# Patient Record
Sex: Male | Born: 1940
Health system: Southern US, Community
[De-identification: ages and names within clinical notes are randomized; demographics above are authoritative.]

## PROBLEM LIST (undated history)

## (undated) DIAGNOSIS — Z87891 Personal history of nicotine dependence: Secondary | ICD-10-CM

## (undated) DIAGNOSIS — I714 Abdominal aortic aneurysm, without rupture: Secondary | ICD-10-CM

## (undated) DIAGNOSIS — K219 Gastro-esophageal reflux disease without esophagitis: Secondary | ICD-10-CM

## (undated) DIAGNOSIS — Z8669 Personal history of other diseases of the nervous system and sense organs: Secondary | ICD-10-CM

## (undated) DIAGNOSIS — E7800 Pure hypercholesterolemia, unspecified: Secondary | ICD-10-CM

## (undated) DIAGNOSIS — E785 Hyperlipidemia, unspecified: Secondary | ICD-10-CM

## (undated) DIAGNOSIS — R32 Unspecified urinary incontinence: Secondary | ICD-10-CM

## (undated) DIAGNOSIS — C801 Malignant (primary) neoplasm, unspecified: Secondary | ICD-10-CM

## (undated) DIAGNOSIS — R51 Headache: Secondary | ICD-10-CM

## (undated) DIAGNOSIS — M1990 Unspecified osteoarthritis, unspecified site: Secondary | ICD-10-CM

## (undated) DIAGNOSIS — I6523 Occlusion and stenosis of bilateral carotid arteries: Secondary | ICD-10-CM

## (undated) DIAGNOSIS — D573 Sickle-cell trait: Secondary | ICD-10-CM

## (undated) DIAGNOSIS — C61 Malignant neoplasm of prostate: Secondary | ICD-10-CM

## (undated) DIAGNOSIS — I10 Essential (primary) hypertension: Secondary | ICD-10-CM

## (undated) HISTORY — DX: Personal history of nicotine dependence: Z87.891

## (undated) HISTORY — DX: Essential (primary) hypertension: I10

## (undated) HISTORY — PX: CATARACT EXTRACTION, BILATERAL: SHX1313

## (undated) HISTORY — PX: COLONOSCOPY: SHX174

## (undated) HISTORY — DX: Pure hypercholesterolemia, unspecified: E78.00

## (undated) HISTORY — DX: Abdominal aortic aneurysm, without rupture: I71.4

## (undated) HISTORY — DX: Hyperlipidemia, unspecified: E78.5

## (undated) HISTORY — PX: FRACTURE SURGERY: SHX138

## (undated) HISTORY — PX: CARDIAC CATHETERIZATION: SHX172

---
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
## 1999-11-25 HISTORY — PX: PROSTATECTOMY: SHX69

## 2003-12-11 ENCOUNTER — Ambulatory Visit (HOSPITAL_COMMUNITY): Admission: RE | Admit: 2003-12-11 | Discharge: 2003-12-11 | Payer: Self-pay | Admitting: Gastroenterology

## 2006-03-12 ENCOUNTER — Encounter: Admission: RE | Admit: 2006-03-12 | Discharge: 2006-03-12 | Payer: Self-pay | Admitting: Internal Medicine

## 2007-11-25 HISTORY — PX: OTHER SURGICAL HISTORY: SHX169

## 2007-11-25 HISTORY — PX: BLADDER TUMOR EXCISION: SHX238

## 2008-02-03 ENCOUNTER — Encounter: Admission: RE | Admit: 2008-02-03 | Discharge: 2008-02-03 | Payer: Self-pay | Admitting: Internal Medicine

## 2008-11-03 ENCOUNTER — Encounter: Admission: RE | Admit: 2008-11-03 | Discharge: 2008-11-03 | Payer: Self-pay | Admitting: Internal Medicine

## 2010-05-01 HISTORY — PX: ARTHROSCOPY KNEE W/ DRILLING: SUR92

## 2011-01-31 ENCOUNTER — Ambulatory Visit (HOSPITAL_COMMUNITY)
Admission: RE | Admit: 2011-01-31 | Discharge: 2011-01-31 | Disposition: A | Discharge status: Home / Self Care | Payer: Worker's Compensation | Source: Ambulatory Visit | Attending: Orthopedic Surgery | Admitting: Orthopedic Surgery

## 2011-01-31 ENCOUNTER — Other Ambulatory Visit (HOSPITAL_COMMUNITY): Payer: Self-pay | Admitting: Orthopedic Surgery

## 2011-01-31 ENCOUNTER — Encounter (HOSPITAL_COMMUNITY): Payer: Worker's Compensation

## 2011-01-31 DIAGNOSIS — M1990 Unspecified osteoarthritis, unspecified site: Secondary | ICD-10-CM

## 2011-01-31 DIAGNOSIS — Z01810 Encounter for preprocedural cardiovascular examination: Secondary | ICD-10-CM | POA: Insufficient documentation

## 2011-01-31 DIAGNOSIS — Z01818 Encounter for other preprocedural examination: Secondary | ICD-10-CM | POA: Insufficient documentation

## 2011-01-31 DIAGNOSIS — M1710 Unilateral primary osteoarthritis, unspecified knee: Secondary | ICD-10-CM | POA: Insufficient documentation

## 2011-01-31 DIAGNOSIS — M47814 Spondylosis without myelopathy or radiculopathy, thoracic region: Secondary | ICD-10-CM | POA: Insufficient documentation

## 2011-01-31 LAB — COMPREHENSIVE METABOLIC PANEL
Albumin: 4.2 g/dL (ref 3.5–5.2)
CO2: 27 mEq/L (ref 19–32)
Calcium: 9.5 mg/dL (ref 8.4–10.5)
Creatinine, Ser: 0.97 mg/dL (ref 0.4–1.5)
GFR calc Af Amer: >60 mL/min (ref >60)
GFR calc non Af Amer: >60 mL/min (ref >60)
Sodium: 138 mEq/L (ref 135–145)

## 2011-01-31 LAB — DIFFERENTIAL
Basophils Absolute: 0 10*3/uL (ref 0.0–0.1)
Eosinophils Relative: 3 % (ref 0–5)
Neutro Abs: 1.8 10*3/uL (ref 1.7–7.7)
Neutrophils Relative %: 43 % (ref 43–77)

## 2011-01-31 LAB — CBC
HCT: 39 % (ref 39.0–52.0)
Hemoglobin: 13.2 g/dL (ref 13.0–17.0)
MCH: 29.5 pg (ref 26.0–34.0)
MCV: 87.2 fL (ref 78.0–100.0)
RDW: 13 % (ref 11.5–15.5)

## 2011-01-31 LAB — URINALYSIS, ROUTINE W REFLEX MICROSCOPIC
Glucose, UA: NEGATIVE mg/dL
Nitrite: NEGATIVE
Protein, ur: NEGATIVE mg/dL
Specific Gravity, Urine: 1.017 (ref 1.005–1.030)
Urobilinogen, UA: 0.2 mg/dL (ref 0.0–1.0)

## 2011-01-31 LAB — SURGICAL PCR SCREEN
MRSA, PCR: NEGATIVE
Staphylococcus aureus: NEGATIVE

## 2011-01-31 LAB — PROTIME-INR: Prothrombin Time: 13 seconds (ref 11.6–15.2)

## 2011-02-06 ENCOUNTER — Inpatient Hospital Stay (HOSPITAL_COMMUNITY)
Admission: RE | Admit: 2011-02-06 | Discharge: 2011-02-08 | DRG: 470 | Disposition: A | Discharge status: Home Health | Payer: Worker's Compensation | Source: Ambulatory Visit | Attending: Orthopedic Surgery | Admitting: Orthopedic Surgery

## 2011-02-06 DIAGNOSIS — M1710 Unilateral primary osteoarthritis, unspecified knee: Principal | ICD-10-CM | POA: Diagnosis present

## 2011-02-06 DIAGNOSIS — Z8546 Personal history of malignant neoplasm of prostate: Secondary | ICD-10-CM

## 2011-02-06 DIAGNOSIS — K219 Gastro-esophageal reflux disease without esophagitis: Secondary | ICD-10-CM | POA: Diagnosis present

## 2011-02-06 DIAGNOSIS — D573 Sickle-cell trait: Secondary | ICD-10-CM | POA: Diagnosis present

## 2011-02-06 DIAGNOSIS — Z87891 Personal history of nicotine dependence: Secondary | ICD-10-CM

## 2011-02-06 DIAGNOSIS — D62 Acute posthemorrhagic anemia: Secondary | ICD-10-CM | POA: Diagnosis not present

## 2011-02-06 DIAGNOSIS — I10 Essential (primary) hypertension: Secondary | ICD-10-CM | POA: Diagnosis present

## 2011-02-06 HISTORY — PX: JOINT REPLACEMENT: SHX530

## 2011-02-06 LAB — CROSSMATCH: Antibody Screen: NEGATIVE

## 2011-02-06 LAB — ABO/RH: ABO/RH(D): O POS

## 2011-02-07 LAB — BASIC METABOLIC PANEL
Chloride: 106 mEq/L (ref 96–112)
GFR calc Af Amer: >60 mL/min (ref >60)
GFR calc non Af Amer: >60 mL/min (ref >60)

## 2011-02-07 LAB — CBC
Hemoglobin: 10.5 g/dL — ABNORMAL LOW (ref 13.0–17.0)
MCH: 29.1 pg (ref 26.0–34.0)
MCHC: 32.9 g/dL (ref 30.0–36.0)
MCV: 88.4 fL (ref 78.0–100.0)
RBC: 3.61 MIL/uL — ABNORMAL LOW (ref 4.22–5.81)
WBC: 5.8 10*3/uL (ref 4.0–10.5)

## 2011-02-08 LAB — BASIC METABOLIC PANEL
CO2: 29 mEq/L (ref 19–32)
Calcium: 8.4 mg/dL (ref 8.4–10.5)
Creatinine, Ser: 0.99 mg/dL (ref 0.4–1.5)
GFR calc Af Amer: >60 mL/min (ref >60)
Potassium: 3.5 mEq/L (ref 3.5–5.1)

## 2011-02-08 LAB — CBC
HCT: 28.4 % — ABNORMAL LOW (ref 39.0–52.0)
MCHC: 33.8 g/dL (ref 30.0–36.0)
Platelets: 141 10*3/uL — ABNORMAL LOW (ref 150–400)
WBC: 6 10*3/uL (ref 4.0–10.5)

## 2011-02-25 NOTE — Op Note (Signed)
NAME:  Kevin Pacheco, Kevin Pacheco          ACCOUNT NO.:  [id number]  MEDICAL RECORD NO.:  [id number]           PATIENT TYPE:  I  LOCATION:  0002                         FACILITY:  [facility]  PHYSICIAN:  Kinzley Savell L. Rendall, M.D.  DATE OF BIRTH:  17-Apr-1941  DATE OF PROCEDURE: DATE OF DISCHARGE:                              OPERATIVE REPORT   PREOPERATIVE DIAGNOSIS:  Osteoarthritis, left knee.  SURGICAL PROCEDURES:  Left LCS total knee arthroplasty with computer navigation assistance.  POSTOPERATIVE DIAGNOSIS:  Osteoarthritis, left knee.  SURGEON:  Bobby Ragan L. Rendall, M.D.  ASSISTANT:  Legrand Pitts. Duffy, P.A.  ANESTHESIA:  General with femoral nerve block.  PATHOLOGY:  The patient has 5 degrees fixed varus, 4 degrees fixed flexion contracture and complete bone-against-bone in the medial compartment.  He has had unremitting pain in the medial side of his knee and is looking forward to the procedure.  PROCEDURE:  Under general anesthesia with femoral nerve block, the left leg was prepared with DuraPrep and draped as a sterile field.  Sterile tourniquet is placed proximally.  The leg was wrapped out with the Esmarch and the tourniquet was elevated at 350 mmHg.  Time-out was done. Preoperative antibiotics were received and everything was good.  Midline incision is made.  The patella was everted.  The knee is debrided in preparation for computer navigation.  Schanz pins were then placed in the superior medial tibia and distal femur.  The arrays were set up. The ankle and hip were mapped.  The proximal tibia and distal femur are mapped.  The guide is then used for proximal tibial resection.  This was done within 1 degree of anatomic alignment.  The balancer was then used and even after proximal tibial resection, there was 4 degrees fixed varus.  This required further release at the medial joint line and the computer helped guide this.  Once release was appropriately performed, the first femoral guide  was used and anterior and posterior flare of the distal femur were resected within 1 degree of anatomic rotation.  Distal femoral cut was then made for a large.  The gap balancing appeared well balanced at 10.  The lamina spreader was inserted.  Remnants of the menisci and cruciates were removed.  Spurs were taken down on the back of femoral condyles.  Once this was completed, the tibia was sized at #5.  Trial was placed on the tibia.  Center peg hole with keels were applied.  Then a trial seating of a #5 tibia, 10 bearing and large femur revealed anatomic alignment within half a degree of anatomic alignment. Full extension to 0 and the knee was stable to varus, valgus and drawer testing.  The patella was then osteotomized.  There was a very thick patella was taken down to about 10 mm and 3 peg holes placed.  Patellar component inserted and with all components in place, there was excellent fit, alignment, flexion and stability.  At this point, permanent components were obtained.  The knee was copiously irrigated with saline, doing pulse irrigation.  All components were then cemented in place and while waiting for cement to harden, anterior synovectomy was carried out.  Tourniquet was let down at 66 minutes with the cement hard. Multiple small vessels were cauterized.  A medium Hemovac drain was inserted.  The knee was then closed in layers with #1 Tycron, 0 Vicryl, 2-0 Vicryl and skin clips.  The patient tolerated the procedure well and returned to recovery in good condition.     Elison Worrel L. Priscille Kluver, M.D.     Renato Gails  D:  02/06/2011  T:  02/06/2011  Job:  [id number]  Electronically Signed by Erasmo Leventhal M.D. on 02/25/2011 01:43:21 PM

## 2011-04-11 NOTE — Op Note (Signed)
NAME:  Kevin Pacheco, Kevin Pacheco                      ACCOUNT NO.:  [id number]   MEDICAL RECORD NO.:  [id number]                   PATIENT TYPE:  AMB   LOCATION:  ENDO                                 FACILITY:  [facility]   PHYSICIAN:  Anselmo Rod, M.D.               DATE OF BIRTH:  07-15-1941   DATE OF PROCEDURE:  12/11/2003  DATE OF DISCHARGE:                                 OPERATIVE REPORT   PROCEDURE PERFORMED:  Colonoscopy.   ENDOSCOPIST:  Charna Elizabeth, M.D.   INSTRUMENT USED:  Olympus video colonoscope.   INDICATIONS FOR PROCEDURE:  The patient is a 70 year old African-American  male with a personal history of prostate cancer undergoing colonoscopy for  rectal bleeding.  Rule out colonic polyps, masses, etc.  The patient has a  history of colonic polyps in the past.   PREPROCEDURE PREPARATION:  Informed consent was procured from the patient.  The patient was fasted for eight hours prior to the procedure and prepped  with a bottle of magnesium citrate and a gallon of GoLYTELY the night prior  to the procedure.   PREPROCEDURE PHYSICAL:  The patient had stable vital signs.  Neck supple.  Chest clear to auscultation.  S1 and S2 regular.  Abdomen soft with normal  bowel sounds.   DESCRIPTION OF PROCEDURE:  The patient was placed in left lateral decubitus  position and sedated with 70 mg of Demerol and 7 mg of Versed in slow  incremental doses.  Once the patient was adequately sedated and maintained  on low flow oxygen and continuous cardiac monitoring, the Olympus video  colonoscope was advanced from the rectum to the cecum.  The appendicular  orifice and ileocecal valve were clearly visualized and photographed.  There  was some residual stool in the colon and multiple washes were done.  No  masses or polyps were identified.  There was no evidence of diverticulosis.  Small internal hemorrhoids were recognized on retroflexion in the rectum.  The patient tolerated the procedure well  without complications.   IMPRESSION:  1. Normal colonoscopy up to the cecum.  No masses, polyps or diverticulosis     were seen.  2. Small nonbleeding internal hemorrhoids.   RECOMMENDATIONS:  1. Repeat colorectal cancer screening is recommended in the next 10 years     unless the patient develops any abnormal symptoms in the interim.  2. Continue high fiber diet with liberal fluid intake.  3. Trial of Anusol suppositories if the patient has ongoing rectal bleeding.  4. Outpatient followup as need arises in the future.                                               Anselmo Rod, M.D.    JNM/MEDQ  D:  12/11/2003  T:  12/11/2003  Job:  [id number]   cc:   Gabriel Earing, M.D.  [address]  [place]  [place] [address]  Fax: [phone]

## 2013-01-04 ENCOUNTER — Other Ambulatory Visit (HOSPITAL_COMMUNITY): Payer: Self-pay | Admitting: Orthopedic Surgery

## 2013-01-04 DIAGNOSIS — M25562 Pain in left knee: Secondary | ICD-10-CM

## 2013-01-10 ENCOUNTER — Encounter (HOSPITAL_COMMUNITY)
Admission: RE | Admit: 2013-01-10 | Discharge: 2013-01-10 | Disposition: A | Discharge status: Home / Self Care | Payer: Worker's Compensation | Source: Ambulatory Visit | Attending: Orthopedic Surgery | Admitting: Orthopedic Surgery

## 2013-01-10 DIAGNOSIS — Z96659 Presence of unspecified artificial knee joint: Secondary | ICD-10-CM | POA: Insufficient documentation

## 2013-01-10 DIAGNOSIS — M25562 Pain in left knee: Secondary | ICD-10-CM

## 2013-01-10 DIAGNOSIS — M25569 Pain in unspecified knee: Secondary | ICD-10-CM | POA: Insufficient documentation

## 2013-01-10 DIAGNOSIS — R937 Abnormal findings on diagnostic imaging of other parts of musculoskeletal system: Secondary | ICD-10-CM | POA: Insufficient documentation

## 2013-01-10 MED ORDER — TECHNETIUM TC 99M MEDRONATE IV KIT
27.0000 | PACK | Freq: Once | INTRAVENOUS | Status: AC | PRN
  Administered 2013-01-10: 27 via INTRAVENOUS

## 2013-02-28 ENCOUNTER — Encounter (HOSPITAL_COMMUNITY): Payer: Self-pay | Admitting: Pharmacy Technician

## 2013-03-04 ENCOUNTER — Other Ambulatory Visit: Payer: Self-pay | Admitting: Orthopedic Surgery

## 2013-03-07 ENCOUNTER — Encounter (HOSPITAL_COMMUNITY)
Admission: RE | Admit: 2013-03-07 | Discharge: 2013-03-07 | Disposition: A | Discharge status: Home / Self Care | Payer: Worker's Compensation | Source: Ambulatory Visit | Attending: Orthopedic Surgery | Admitting: Orthopedic Surgery

## 2013-03-07 ENCOUNTER — Encounter (HOSPITAL_COMMUNITY): Payer: Self-pay

## 2013-03-07 ENCOUNTER — Ambulatory Visit (HOSPITAL_COMMUNITY)
Admission: RE | Admit: 2013-03-07 | Discharge: 2013-03-07 | Disposition: A | Discharge status: Home / Self Care | Payer: Worker's Compensation | Source: Ambulatory Visit | Attending: Orthopedic Surgery | Admitting: Orthopedic Surgery

## 2013-03-07 DIAGNOSIS — I10 Essential (primary) hypertension: Secondary | ICD-10-CM | POA: Insufficient documentation

## 2013-03-07 DIAGNOSIS — Z01818 Encounter for other preprocedural examination: Secondary | ICD-10-CM | POA: Insufficient documentation

## 2013-03-07 DIAGNOSIS — F17200 Nicotine dependence, unspecified, uncomplicated: Secondary | ICD-10-CM | POA: Insufficient documentation

## 2013-03-07 DIAGNOSIS — M25569 Pain in unspecified knee: Secondary | ICD-10-CM | POA: Insufficient documentation

## 2013-03-07 DIAGNOSIS — Z01812 Encounter for preprocedural laboratory examination: Secondary | ICD-10-CM | POA: Insufficient documentation

## 2013-03-07 HISTORY — DX: Headache: R51

## 2013-03-07 HISTORY — DX: Malignant (primary) neoplasm, unspecified: C80.1

## 2013-03-07 HISTORY — DX: Essential (primary) hypertension: I10

## 2013-03-07 HISTORY — DX: Unspecified osteoarthritis, unspecified site: M19.90

## 2013-03-07 HISTORY — DX: Gastro-esophageal reflux disease without esophagitis: K21.9

## 2013-03-07 LAB — COMPREHENSIVE METABOLIC PANEL
ALT: 19 U/L (ref 0–53)
CO2: 28 mEq/L (ref 19–32)
Calcium: 9.4 mg/dL (ref 8.4–10.5)
GFR calc Af Amer: >90 mL/min (ref >90)
GFR calc non Af Amer: 84 mL/min — ABNORMAL LOW (ref >90)
Glucose, Bld: 103 mg/dL — ABNORMAL HIGH (ref 70–99)
Sodium: 141 mEq/L (ref 135–145)

## 2013-03-07 LAB — URINALYSIS, ROUTINE W REFLEX MICROSCOPIC
Glucose, UA: NEGATIVE mg/dL
Ketones, ur: NEGATIVE mg/dL
Protein, ur: NEGATIVE mg/dL

## 2013-03-07 LAB — CBC WITH DIFFERENTIAL/PLATELET
Eosinophils Relative: 2 % (ref 0–5)
HCT: 39.2 % (ref 39.0–52.0)
Hemoglobin: 13.9 g/dL (ref 13.0–17.0)
Lymphocytes Relative: 46 % (ref 12–46)
Lymphs Abs: 2 10*3/uL (ref 0.7–4.0)
MCV: 85.8 fL (ref 78.0–100.0)
Monocytes Absolute: 0.3 10*3/uL (ref 0.1–1.0)
Monocytes Relative: 7 % (ref 3–12)
Platelets: 187 10*3/uL (ref 150–400)
RBC: 4.57 MIL/uL (ref 4.22–5.81)
WBC: 4.3 10*3/uL (ref 4.0–10.5)

## 2013-03-07 LAB — TYPE AND SCREEN
ABO/RH(D): O POS
Antibody Screen: NEGATIVE

## 2013-03-07 LAB — SURGICAL PCR SCREEN: MRSA, PCR: NEGATIVE

## 2013-03-07 NOTE — Pre-Procedure Instructions (Signed)
Kevin Pacheco  03/07/2013   Your procedure is scheduled on:  Monday March 14, 2013  Report to [facility] at [phone] AM.  Call this number if you have problems the morning of surgery: [phone]   Remember:   Do not eat food or drink liquids after midnight.Sunday   Take these medicines the morning of surgery with A SIP OF WATER: Omeprazole (Prilosec)   Do not wear jewelry.  Do not wear lotions,  orpowder. You may wear deodorant.             Men may shave face and neck.  Do not bring valuables to the hospital.  Contacts, dentures or bridgework may not be worn into surgery.  Leave suitcase in the car. After surgery it may be brought to your room.  For patients admitted to the hospital, checkout time is 11:00 AM the day of  discharge.   Patients discharged the day of surgery will not be allowed to drive  home.    Special Instructions: Incentive Spirometry - Practice and bring it with you on the day of surgery. Shower using CHG 2 nights before surgery and the night before surgery.  If you shower the day of surgery use CHG.  Use special wash - you have one bottle of CHG for all showers.  You should use approximately 1/3 of the bottle for each shower.   Please read over the following fact sheets that you were given: Pain Booklet, Coughing and Deep Breathing, Blood Transfusion Information, MRSA Information and Surgical Site Infection Prevention

## 2013-03-08 LAB — URINE CULTURE
Colony Count: NO GROWTH
Culture: NO GROWTH

## 2013-03-14 ENCOUNTER — Ambulatory Visit (HOSPITAL_COMMUNITY): Payer: Worker's Compensation | Admitting: Anesthesiology

## 2013-03-14 ENCOUNTER — Inpatient Hospital Stay (HOSPITAL_COMMUNITY)
Admission: RE | Admit: 2013-03-14 | Discharge: 2013-03-16 | DRG: 467 | Disposition: A | Discharge status: Home Health | Payer: Worker's Compensation | Source: Ambulatory Visit | Attending: Orthopedic Surgery | Admitting: Orthopedic Surgery

## 2013-03-14 ENCOUNTER — Encounter (HOSPITAL_COMMUNITY): Payer: Self-pay | Admitting: *Deleted

## 2013-03-14 ENCOUNTER — Encounter (HOSPITAL_COMMUNITY): Payer: Self-pay | Admitting: Anesthesiology

## 2013-03-14 ENCOUNTER — Encounter (HOSPITAL_COMMUNITY)
Admission: RE | Disposition: A | Discharge status: Home Health | Payer: Self-pay | Source: Ambulatory Visit | Attending: Orthopedic Surgery

## 2013-03-14 DIAGNOSIS — M129 Arthropathy, unspecified: Secondary | ICD-10-CM | POA: Diagnosis present

## 2013-03-14 DIAGNOSIS — K219 Gastro-esophageal reflux disease without esophagitis: Secondary | ICD-10-CM | POA: Diagnosis present

## 2013-03-14 DIAGNOSIS — T8489XA Other specified complication of internal orthopedic prosthetic devices, implants and grafts, initial encounter: Principal | ICD-10-CM | POA: Diagnosis present

## 2013-03-14 DIAGNOSIS — I10 Essential (primary) hypertension: Secondary | ICD-10-CM | POA: Diagnosis present

## 2013-03-14 DIAGNOSIS — Z9079 Acquired absence of other genital organ(s): Secondary | ICD-10-CM

## 2013-03-14 DIAGNOSIS — Z87891 Personal history of nicotine dependence: Secondary | ICD-10-CM

## 2013-03-14 DIAGNOSIS — M25569 Pain in unspecified knee: Secondary | ICD-10-CM | POA: Diagnosis present

## 2013-03-14 DIAGNOSIS — D62 Acute posthemorrhagic anemia: Secondary | ICD-10-CM | POA: Diagnosis not present

## 2013-03-14 DIAGNOSIS — Y831 Surgical operation with implant of artificial internal device as the cause of abnormal reaction of the patient, or of later complication, without mention of misadventure at the time of the procedure: Secondary | ICD-10-CM | POA: Diagnosis present

## 2013-03-14 DIAGNOSIS — Z96652 Presence of left artificial knee joint: Secondary | ICD-10-CM

## 2013-03-14 DIAGNOSIS — R51 Headache: Secondary | ICD-10-CM | POA: Diagnosis present

## 2013-03-14 DIAGNOSIS — Z8546 Personal history of malignant neoplasm of prostate: Secondary | ICD-10-CM

## 2013-03-14 HISTORY — PX: I & D KNEE WITH POLY EXCHANGE: SHX5024

## 2013-03-14 LAB — CREATININE, SERUM
Creatinine, Ser: 0.96 mg/dL (ref 0.50–1.35)
GFR calc Af Amer: >90 mL/min (ref >90)
GFR calc non Af Amer: 80 mL/min — ABNORMAL LOW (ref >90)

## 2013-03-14 LAB — GRAM STAIN

## 2013-03-14 LAB — CBC
HCT: 36.4 % — ABNORMAL LOW (ref 39.0–52.0)
Hemoglobin: 12.8 g/dL — ABNORMAL LOW (ref 13.0–17.0)
WBC: 9 10*3/uL (ref 4.0–10.5)

## 2013-03-14 SURGERY — IRRIGATION AND DEBRIDEMENT KNEE WITH POLY EXCHANGE
Anesthesia: Regional | Site: Knee | Laterality: Left | Wound class: Clean

## 2013-03-14 MED ORDER — ACETAMINOPHEN 325 MG PO TABS: 650.0000 mg | ORAL_TABLET | Freq: Four times a day (QID) | ORAL | Status: DC | PRN

## 2013-03-14 MED ORDER — SENNOSIDES-DOCUSATE SODIUM 8.6-50 MG PO TABS: 1.0000 | ORAL_TABLET | Freq: Every evening | ORAL | Status: DC | PRN

## 2013-03-14 MED ORDER — METOCLOPRAMIDE HCL 10 MG PO TABS: 5.0000 mg | ORAL_TABLET | Freq: Three times a day (TID) | ORAL | Status: DC | PRN

## 2013-03-14 MED ORDER — OXYCODONE HCL 5 MG PO TABS
5.0000 mg | ORAL_TABLET | ORAL | Status: DC | PRN
  Administered 2013-03-15 – 2013-03-16 (×2): 10 mg via ORAL
  Filled 2013-03-14 (×2): qty 2

## 2013-03-14 MED ORDER — BUPIVACAINE-EPINEPHRINE 0.25% -1:200000 IJ SOLN
INTRAMUSCULAR | Status: DC | PRN
  Administered 2013-03-14: 20 mL

## 2013-03-14 MED ORDER — ZOLPIDEM TARTRATE 5 MG PO TABS: 5.0000 mg | ORAL_TABLET | Freq: Every evening | ORAL | Status: DC | PRN

## 2013-03-14 MED ORDER — METHOCARBAMOL 500 MG PO TABS: 500.0000 mg | ORAL_TABLET | Freq: Four times a day (QID) | ORAL | Status: DC | PRN

## 2013-03-14 MED ORDER — PROPOFOL 10 MG/ML IV BOLUS
INTRAVENOUS | Status: DC | PRN
  Administered 2013-03-14: 20 mg via INTRAVENOUS
  Administered 2013-03-14: 120 mg via INTRAVENOUS
  Administered 2013-03-14 (×3): 20 mg via INTRAVENOUS

## 2013-03-14 MED ORDER — METOCLOPRAMIDE HCL 5 MG/ML IJ SOLN: 5.0000 mg | Freq: Three times a day (TID) | INTRAMUSCULAR | Status: DC | PRN

## 2013-03-14 MED ORDER — SIMVASTATIN 40 MG PO TABS
40.0000 mg | ORAL_TABLET | Freq: Every day | ORAL | Status: DC
  Administered 2013-03-14 – 2013-03-15 (×2): 40 mg via ORAL
  Filled 2013-03-14 (×3): qty 1

## 2013-03-14 MED ORDER — OXYCODONE HCL ER 10 MG PO T12A
10.0000 mg | EXTENDED_RELEASE_TABLET | Freq: Two times a day (BID) | ORAL | Status: DC
  Administered 2013-03-14 – 2013-03-16 (×4): 10 mg via ORAL
  Filled 2013-03-14 (×4): qty 1

## 2013-03-14 MED ORDER — OXYCODONE HCL 5 MG PO TABS
5.0000 mg | ORAL_TABLET | Freq: Once | ORAL | Status: AC | PRN
  Administered 2013-03-14: 5 mg via ORAL

## 2013-03-14 MED ORDER — OXYCODONE HCL 5 MG PO TABS
ORAL_TABLET | ORAL | Status: AC
  Filled 2013-03-14: qty 1

## 2013-03-14 MED ORDER — LACTATED RINGERS IV SOLN
INTRAVENOUS | Status: DC
  Administered 2013-03-14: 10:00:00 via INTRAVENOUS

## 2013-03-14 MED ORDER — CHLORHEXIDINE GLUCONATE 4 % EX LIQD: 60.0000 mL | Freq: Once | CUTANEOUS | Status: DC

## 2013-03-14 MED ORDER — BUPIVACAINE 0.25 % ON-Q PUMP SINGLE CATH 300ML
300.0000 mL | INJECTION | Status: DC
  Filled 2013-03-14: qty 300

## 2013-03-14 MED ORDER — ONDANSETRON HCL 4 MG/2ML IJ SOLN
INTRAMUSCULAR | Status: DC | PRN
  Administered 2013-03-14: 4 mg via INTRAVENOUS

## 2013-03-14 MED ORDER — ACETAMINOPHEN 10 MG/ML IV SOLN
INTRAVENOUS | Status: AC
  Filled 2013-03-14: qty 100

## 2013-03-14 MED ORDER — DEXAMETHASONE SODIUM PHOSPHATE 4 MG/ML IJ SOLN
INTRAMUSCULAR | Status: DC | PRN
  Administered 2013-03-14: 4 mg

## 2013-03-14 MED ORDER — ACETAMINOPHEN 650 MG RE SUPP: 650.0000 mg | Freq: Four times a day (QID) | RECTAL | Status: DC | PRN

## 2013-03-14 MED ORDER — METHOCARBAMOL 100 MG/ML IJ SOLN: 500.0000 mg | Freq: Four times a day (QID) | INTRAVENOUS | Status: DC | PRN

## 2013-03-14 MED ORDER — MENTHOL 3 MG MT LOZG: 1.0000 | LOZENGE | OROMUCOSAL | Status: DC | PRN

## 2013-03-14 MED ORDER — FLEET ENEMA 7-19 GM/118ML RE ENEM: 1.0000 | ENEMA | Freq: Once | RECTAL | Status: AC | PRN

## 2013-03-14 MED ORDER — CEFAZOLIN SODIUM 1-5 GM-% IV SOLN
INTRAVENOUS | Status: AC
  Filled 2013-03-14: qty 100

## 2013-03-14 MED ORDER — ACETAMINOPHEN 10 MG/ML IV SOLN
1000.0000 mg | Freq: Four times a day (QID) | INTRAVENOUS | Status: AC
  Administered 2013-03-14 – 2013-03-15 (×4): 1000 mg via INTRAVENOUS
  Filled 2013-03-14 (×4): qty 100

## 2013-03-14 MED ORDER — DOCUSATE SODIUM 100 MG PO CAPS
100.0000 mg | ORAL_CAPSULE | Freq: Two times a day (BID) | ORAL | Status: DC
  Administered 2013-03-14 – 2013-03-16 (×4): 100 mg via ORAL
  Filled 2013-03-14 (×5): qty 1

## 2013-03-14 MED ORDER — CELECOXIB 200 MG PO CAPS
200.0000 mg | ORAL_CAPSULE | Freq: Two times a day (BID) | ORAL | Status: DC
  Administered 2013-03-14 – 2013-03-16 (×4): 200 mg via ORAL
  Filled 2013-03-14 (×5): qty 1

## 2013-03-14 MED ORDER — FENTANYL CITRATE 0.05 MG/ML IJ SOLN
100.0000 ug | Freq: Once | INTRAMUSCULAR | Status: AC
  Administered 2013-03-14: 100 ug via INTRAVENOUS

## 2013-03-14 MED ORDER — BUPIVACAINE-EPINEPHRINE PF 0.5-1:200000 % IJ SOLN
INTRAMUSCULAR | Status: DC | PRN
  Administered 2013-03-14: 25 mL

## 2013-03-14 MED ORDER — ACETAMINOPHEN 10 MG/ML IV SOLN
1000.0000 mg | Freq: Four times a day (QID) | INTRAVENOUS | Status: DC
Start: 2013-03-14 — End: 2013-03-14
  Administered 2013-03-14: 1000 mg via INTRAVENOUS

## 2013-03-14 MED ORDER — BUPIVACAINE-EPINEPHRINE 0.25% -1:200000 IJ SOLN
INTRAMUSCULAR | Status: AC
  Filled 2013-03-14: qty 1

## 2013-03-14 MED ORDER — BUPIVACAINE 0.25 % ON-Q PUMP SINGLE CATH 300ML
INJECTION | Status: DC | PRN
  Administered 2013-03-14: 300 mL

## 2013-03-14 MED ORDER — LACTATED RINGERS IV SOLN
INTRAVENOUS | Status: DC | PRN
  Administered 2013-03-14 (×2): via INTRAVENOUS

## 2013-03-14 MED ORDER — SODIUM CHLORIDE 0.9 % IV SOLN
INTRAVENOUS | Status: DC
  Administered 2013-03-14: 17:00:00 via INTRAVENOUS

## 2013-03-14 MED ORDER — LIDOCAINE HCL (CARDIAC) 20 MG/ML IV SOLN
INTRAVENOUS | Status: DC | PRN
  Administered 2013-03-14: 80 mg via INTRAVENOUS

## 2013-03-14 MED ORDER — CEFAZOLIN SODIUM-DEXTROSE 2-3 GM-% IV SOLR
2.0000 g | Freq: Four times a day (QID) | INTRAVENOUS | Status: AC
  Administered 2013-03-14 (×2): 2 g via INTRAVENOUS
  Filled 2013-03-14 (×2): qty 50

## 2013-03-14 MED ORDER — ARTIFICIAL TEARS OP OINT
TOPICAL_OINTMENT | OPHTHALMIC | Status: DC | PRN
  Administered 2013-03-14: 1 via OPHTHALMIC

## 2013-03-14 MED ORDER — BISACODYL 5 MG PO TBEC: 5.0000 mg | DELAYED_RELEASE_TABLET | Freq: Every day | ORAL | Status: DC | PRN

## 2013-03-14 MED ORDER — BUPIVACAINE ON-Q PAIN PUMP (FOR ORDER SET NO CHG)
INJECTION | Status: DC
  Filled 2013-03-14: qty 1

## 2013-03-14 MED ORDER — PHENOL 1.4 % MT LIQD: 1.0000 | OROMUCOSAL | Status: DC | PRN

## 2013-03-14 MED ORDER — ALUM & MAG HYDROXIDE-SIMETH 200-200-20 MG/5ML PO SUSP: 30.0000 mL | ORAL | Status: DC | PRN

## 2013-03-14 MED ORDER — OXYCODONE HCL 5 MG/5ML PO SOLN: 5.0000 mg | Freq: Once | ORAL | Status: AC | PRN

## 2013-03-14 MED ORDER — LISINOPRIL 20 MG PO TABS
20.0000 mg | ORAL_TABLET | Freq: Every day | ORAL | Status: DC
  Administered 2013-03-15 – 2013-03-16 (×2): 20 mg via ORAL
  Filled 2013-03-14 (×3): qty 1

## 2013-03-14 MED ORDER — SODIUM CHLORIDE 0.9 % IR SOLN
Status: DC | PRN
  Administered 2013-03-14: 3000 mL

## 2013-03-14 MED ORDER — HYDROMORPHONE HCL PF 1 MG/ML IJ SOLN
INTRAMUSCULAR | Status: AC
  Filled 2013-03-14: qty 1

## 2013-03-14 MED ORDER — FENTANYL CITRATE 0.05 MG/ML IJ SOLN
INTRAMUSCULAR | Status: AC
  Filled 2013-03-14: qty 2

## 2013-03-14 MED ORDER — ENOXAPARIN SODIUM 30 MG/0.3ML ~~LOC~~ SOLN
30.0000 mg | Freq: Two times a day (BID) | SUBCUTANEOUS | Status: DC
  Administered 2013-03-15 – 2013-03-16 (×3): 30 mg via SUBCUTANEOUS
  Filled 2013-03-14 (×5): qty 0.3

## 2013-03-14 MED ORDER — PANTOPRAZOLE SODIUM 40 MG PO TBEC
80.0000 mg | DELAYED_RELEASE_TABLET | Freq: Every day | ORAL | Status: DC
  Administered 2013-03-14 – 2013-03-16 (×3): 80 mg via ORAL
  Filled 2013-03-14 (×3): qty 2

## 2013-03-14 MED ORDER — ONDANSETRON HCL 4 MG PO TABS: 4.0000 mg | ORAL_TABLET | Freq: Four times a day (QID) | ORAL | Status: DC | PRN

## 2013-03-14 MED ORDER — MIDAZOLAM HCL 2 MG/2ML IJ SOLN
INTRAMUSCULAR | Status: AC
  Administered 2013-03-14: 2 mg
  Filled 2013-03-14: qty 2

## 2013-03-14 MED ORDER — EPHEDRINE SULFATE 50 MG/ML IJ SOLN
INTRAMUSCULAR | Status: DC | PRN
  Administered 2013-03-14: 5 mg via INTRAVENOUS
  Administered 2013-03-14: 10 mg via INTRAVENOUS
  Administered 2013-03-14 (×2): 5 mg via INTRAVENOUS

## 2013-03-14 MED ORDER — ONDANSETRON HCL 4 MG/2ML IJ SOLN: 4.0000 mg | Freq: Four times a day (QID) | INTRAMUSCULAR | Status: DC | PRN

## 2013-03-14 MED ORDER — DIPHENHYDRAMINE HCL 12.5 MG/5ML PO ELIX
12.5000 mg | ORAL_SOLUTION | ORAL | Status: DC | PRN
  Administered 2013-03-15: 25 mg via ORAL
  Administered 2013-03-15: 12.5 mg via ORAL
  Filled 2013-03-14 (×2): qty 10

## 2013-03-14 MED ORDER — FENTANYL CITRATE 0.05 MG/ML IJ SOLN
INTRAMUSCULAR | Status: DC | PRN
  Administered 2013-03-14 (×4): 25 ug via INTRAVENOUS
  Administered 2013-03-14: 50 ug via INTRAVENOUS
  Administered 2013-03-14: 75 ug via INTRAVENOUS
  Administered 2013-03-14 (×3): 25 ug via INTRAVENOUS
  Administered 2013-03-14: 50 ug via INTRAVENOUS

## 2013-03-14 MED ORDER — HYDROMORPHONE HCL PF 1 MG/ML IJ SOLN
0.2500 mg | INTRAMUSCULAR | Status: DC | PRN
  Administered 2013-03-14 (×4): 0.25 mg via INTRAVENOUS

## 2013-03-14 MED ORDER — LABETALOL HCL 5 MG/ML IV SOLN
INTRAVENOUS | Status: DC | PRN
  Administered 2013-03-14 (×2): 5 mg via INTRAVENOUS

## 2013-03-14 MED ORDER — CEFAZOLIN SODIUM-DEXTROSE 2-3 GM-% IV SOLR
INTRAVENOUS | Status: DC | PRN
  Administered 2013-03-14: 2 g via INTRAVENOUS

## 2013-03-14 MED ORDER — HYDROMORPHONE HCL PF 1 MG/ML IJ SOLN
1.0000 mg | INTRAMUSCULAR | Status: DC | PRN
  Administered 2013-03-14: 1 mg via INTRAVENOUS
  Filled 2013-03-14: qty 1

## 2013-03-14 SURGICAL SUPPLY — 63 items
BAG URINE DRAINAGE (UROLOGICAL SUPPLIES) IMPLANT
BANDAGE ESMARK 6X9 LF (GAUZE/BANDAGES/DRESSINGS) ×1 IMPLANT
BLOCK DISTAL NEXGEN SZF 5 (Orthopedic Implant) ×2 IMPLANT
BNDG CMPR 9X6 STRL LF SNTH (GAUZE/BANDAGES/DRESSINGS) ×1
BNDG ESMARK 6X9 LF (GAUZE/BANDAGES/DRESSINGS) ×2
BONE CEMENT PALACOS R-G (Orthopedic Implant) ×4 IMPLANT
BOWL SMART MIX CTS (DISPOSABLE) ×2 IMPLANT
CATH KIT ON Q 5IN SLV (PAIN MANAGEMENT) ×2 IMPLANT
CEMENT BONE PALACOS R-G (Orthopedic Implant) IMPLANT
CLOTH BEACON ORANGE TIMEOUT ST (SAFETY) ×2 IMPLANT
CONT SPEC 4OZ CLIKSEAL STRL BL (MISCELLANEOUS) ×4 IMPLANT
COVER SURGICAL LIGHT HANDLE (MISCELLANEOUS) ×2 IMPLANT
CUFF TOURNIQUET SINGLE 34IN LL (TOURNIQUET CUFF) IMPLANT
DRAPE EXTREMITY T 121X128X90 (DRAPE) ×2 IMPLANT
DRAPE INCISE IOBAN 66X45 STRL (DRAPES) IMPLANT
DRAPE PROXIMA HALF (DRAPES) ×2 IMPLANT
DRAPE U-SHAPE 47X51 STRL (DRAPES) ×2 IMPLANT
DRSG ADAPTIC 3X8 NADH LF (GAUZE/BANDAGES/DRESSINGS) ×2 IMPLANT
DRSG PAD ABDOMINAL 8X10 ST (GAUZE/BANDAGES/DRESSINGS) ×2 IMPLANT
DURAPREP 26ML APPLICATOR (WOUND CARE) ×4 IMPLANT
ELECT REM PT RETURN 9FT ADLT (ELECTROSURGICAL) ×2
ELECTRODE REM PT RTRN 9FT ADLT (ELECTROSURGICAL) ×1 IMPLANT
EVACUATOR 1/8 PVC DRAIN (DRAIN) ×2 IMPLANT
FEMORAL COMP CONSTRAINED SZ F (Orthopedic Implant) ×1 IMPLANT
GLOVE BIOGEL PI IND STRL 7.5 (GLOVE) IMPLANT
GLOVE BIOGEL PI IND STRL 8.5 (GLOVE) ×2 IMPLANT
GLOVE BIOGEL PI INDICATOR 7.5 (GLOVE)
GLOVE BIOGEL PI INDICATOR 8.5 (GLOVE) ×2
GLOVE SURG ORTHO 7.0 STRL STRW (GLOVE) IMPLANT
GLOVE SURG ORTHO 8.0 STRL STRW (GLOVE) ×4 IMPLANT
GOWN PREVENTION PLUS XLARGE (GOWN DISPOSABLE) ×4 IMPLANT
GOWN STRL NON-REIN LRG LVL3 (GOWN DISPOSABLE) ×4 IMPLANT
HANDPIECE INTERPULSE COAX TIP (DISPOSABLE) ×2
HOOD PEEL AWAY FACE SHEILD DIS (HOOD) ×4 IMPLANT
KIT BASIN OR (CUSTOM PROCEDURE TRAY) ×2 IMPLANT
KIT ROOM TURNOVER OR (KITS) ×2 IMPLANT
LPS ARTI SURF EF 5-6 17MM (Orthopedic Implant) ×1 IMPLANT
MANIFOLD NEPTUNE II (INSTRUMENTS) ×2 IMPLANT
NS IRRIG 1000ML POUR BTL (IV SOLUTION) ×2 IMPLANT
PACK TOTAL JOINT (CUSTOM PROCEDURE TRAY) ×2 IMPLANT
PAD ARMBOARD 7.5X6 YLW CONV (MISCELLANEOUS) ×4 IMPLANT
PADDING CAST ABS 6INX4YD NS (CAST SUPPLIES) ×1
PADDING CAST ABS COTTON 6X4 NS (CAST SUPPLIES) IMPLANT
PADDING CAST COTTON 6X4 STRL (CAST SUPPLIES) ×2 IMPLANT
SET HNDPC FAN SPRY TIP SCT (DISPOSABLE) ×1 IMPLANT
SPONGE GAUZE 4X4 12PLY (GAUZE/BANDAGES/DRESSINGS) ×2 IMPLANT
STAPLER VISISTAT 35W (STAPLE) ×2 IMPLANT
STEM ST EXT ZIER 100X145X15X (Stem) IMPLANT
STEM ST EXT ZIMMER (Stem) ×3 IMPLANT
SUCTION FRAZIER TIP 10 FR DISP (SUCTIONS) ×2 IMPLANT
SUT PDS AB 1 CTXB1 36 (SUTURE) IMPLANT
SUT VIC AB 0 CTB1 27 (SUTURE) ×4 IMPLANT
SUT VIC AB 1 CT1 27 (SUTURE)
SUT VIC AB 1 CT1 27XBRD ANBCTR (SUTURE) IMPLANT
SUT VIC AB 2-0 CT1 27 (SUTURE) ×4
SUT VIC AB 2-0 CT1 TAPERPNT 27 (SUTURE) ×2 IMPLANT
SYR 20CC LL (SYRINGE) ×4 IMPLANT
SYRINGE 10CC LL (SYRINGE) IMPLANT
TOWEL OR 17X24 6PK STRL BLUE (TOWEL DISPOSABLE) ×2 IMPLANT
TOWEL OR 17X26 10 PK STRL BLUE (TOWEL DISPOSABLE) ×2 IMPLANT
TRAY FOLEY CATH 14FR (SET/KITS/TRAYS/PACK) IMPLANT
TRAY TIBIAL SZ6 (Orthopedic Implant) ×1 IMPLANT
WATER STERILE IRR 1000ML POUR (IV SOLUTION) ×6 IMPLANT

## 2013-03-14 NOTE — Anesthesia Postprocedure Evaluation (Signed)
  Anesthesia Post-op Note  Patient: Kevin Pacheco  Procedure(s) Performed: Procedure(s): total knee revision (Left)  Patient Location: PACU  Anesthesia Type:General  Level of Consciousness: awake, oriented and patient cooperative  Airway and Oxygen Therapy: Patient Spontanous Breathing  Post-op Pain: mild  Post-op Assessment: Post-op Vital signs reviewed, Patient's Cardiovascular Status Stable, Respiratory Function Stable, Patent Airway, No signs of Nausea or vomiting and Pain level controlled  Post-op Vital Signs: stable  Complications: No apparent anesthesia complications 

## 2013-03-14 NOTE — Anesthesia Postprocedure Evaluation (Signed)
  Anesthesia Post-op Note  Patient: Kevin Pacheco  Procedure(s) Performed: Procedure(s): total knee revision (Left)  Patient Location: PACU  Anesthesia Type:General  Level of Consciousness: awake, oriented and patient cooperative  Airway and Oxygen Therapy: Patient Spontanous Breathing  Post-op Pain: mild  Post-op Assessment: Post-op Vital signs reviewed, Patient's Cardiovascular Status Stable, Respiratory Function Stable, Patent Airway, No signs of Nausea or vomiting and Pain level controlled  Post-op Vital Signs: stable  Complications: No apparent anesthesia complications

## 2013-03-14 NOTE — H&P (Signed)
  Kevin Pacheco MRN:  [id number] DOB/SEX:  1941-04-08/male  CHIEF COMPLAINT:  Painful left Knee  HISTORY: Patient is a 72 y.o. male presented with a history of pain in the left knee. Onset of symptoms was gradual starting several months ago with gradually worsening course since that time. Prior procedures on the knee include arthroplasty. Patient has been treated conservatively with over-the-counter NSAIDs and activity modification. Patient currently rates pain in the knee at 8 out of 10 with activity. There is pain at night.  PAST MEDICAL HISTORY: There are no active problems to display for this patient.  Past Medical History  Diagnosis Date  . Hypertension   . GERD (gastroesophageal reflux disease)   . Headache   . Cancer     prostate  . Arthritis    Past Surgical History  Procedure Laterality Date  . Prostatectomy  2001  . Fracture surgery Right     ankle  . Cardiac catheterization      hx 1980's can't remember M D   . Joint replacement Left 02/06/11  . Bladder tumor excision  2009  . Urethal sling  2009  . Colonoscopy  2005, 2012  . Arthroscopy knee w/ drilling Left 05/01/10     MEDICATIONS:   No prescriptions prior to admission    ALLERGIES:  No Known Allergies  REVIEW OF SYSTEMS:  Pertinent items are noted in HPI.   FAMILY HISTORY:  No family history on file.  SOCIAL HISTORY:   History  Substance Use Topics  . Smoking status: Former Smoker -- 1.00 packs/day for 40 years    Types: Cigarettes    Quit date: 03/07/2001  . Smokeless tobacco: Never Used  . Alcohol Use: Yes     Comment: social     EXAMINATION:  Vital signs in last 24 hours:    General appearance: alert, cooperative and no distress Lungs: clear to auscultation bilaterally Heart: regular rate and rhythm, S1, S2 normal, no murmur, click, rub or gallop Abdomen: soft, non-tender; bowel sounds normal; no masses,  no organomegaly Extremities: extremities normal, atraumatic, no cyanosis or  edema and Homans sign is negative, no sign of DVT Pulses: 2+ and symmetric Skin: Skin color, texture, turgor normal. No rashes or lesions Neurologic: Alert and oriented X 3, normal strength and tone. Normal symmetric reflexes. Normal coordination and gait  Musculoskeletal:  ROM 0-95, Ligaments intact,  Imaging Review Plain radiographs demonstrate components in good alignment of the left knee. The overall alignment is neutral. The bone quality appears to be good for age and reported activity level.  Assessment/Plan: left knee pain  The patient history, physical examination and imaging studies are consistent with components in good alignmentof the left knee. The patient has failed conservative treatment.  The clearance notes were reviewed.  After discussion with the patient it was felt that Total Knee Revision vs synevectomy was indicated. The procedure,  risks, and benefits of total knee arthroplasty were presented and reviewed. The risks including but not limited to aseptic loosening, infection, blood clots, vascular injury, stiffness, patella tracking problems complications among others were discussed. The patient acknowledged the explanation, agreed to proceed with the plan.  Han Lysne 03/14/2013, 7:07 AM

## 2013-03-14 NOTE — Anesthesia Preprocedure Evaluation (Signed)
Anesthesia Evaluation  Patient identified by MRN, date of birth, ID band Patient awake    Reviewed: Allergy & Precautions, H&P , NPO status , Patient's Chart, lab work & pertinent test results  Airway Mallampati: I TM Distance: >3 FB     Dental   Pulmonary former smoker,  breath sounds clear to auscultation        Cardiovascular hypertension, Rhythm:Regular Rate:Normal     Neuro/Psych  Headaches,    GI/Hepatic GERD-  ,  Endo/Other    Renal/GU      Musculoskeletal   Abdominal   Peds  Hematology   Anesthesia Other Findings   Reproductive/Obstetrics                           Anesthesia Physical Anesthesia Plan  ASA: II  Anesthesia Plan: General   Post-op Pain Management:    Induction: Intravenous  Airway Management Planned: LMA  Additional Equipment:   Intra-op Plan:   Post-operative Plan: Extubation in OR  Informed Consent: I have reviewed the patients History and Physical, chart, labs and discussed the procedure including the risks, benefits and alternatives for the proposed anesthesia with the patient or authorized representative who has indicated his/her understanding and acceptance.     Plan Discussed with: CRNA and Surgeon  Anesthesia Plan Comments:         Anesthesia Quick Evaluation

## 2013-03-14 NOTE — Progress Notes (Signed)
Orthopedic Tech Progress Note Patient Details:  Kevin Pacheco 04/03/1941 [id number] CPM applied to Left LE with appropriate settings. OHF applied to bed.  CPM Left Knee CPM Left Knee: On Left Knee Flexion (Degrees): 90 Left Knee Extension (Degrees): 0   Asia R Thompson 03/14/2013, 3:23 PM

## 2013-03-14 NOTE — Anesthesia Procedure Notes (Addendum)
Anesthesia Regional Block:  Femoral nerve block  Pre-Anesthetic Checklist: ,, timeout performed, Correct Patient, Correct Site, Correct Laterality, Correct Procedure, Correct Position, site marked, Risks and benefits discussed,  Surgical consent,  Pre-op evaluation,  At surgeon's request and post-op pain management  Laterality: Left  Prep: chloraprep       Needles:  Injection technique: Single-shot  Needle Type: Echogenic Stimulator Needle     Needle Length: 5cm 5 cm Needle Gauge: 22 and 22 G    Additional Needles:  Procedures: nerve stimulator Femoral nerve block  Nerve Stimulator or Paresthesia:  Response: 0.48 mA,   Additional Responses:   Narrative:  Start time: 03/14/2013 10:45 AM End time: 03/14/2013 10:54 AM Injection made incrementally with aspirations every 5 mL. Anesthesiologist: Dr Gypsy Balsam  Additional Notes: 2130-8657 L FNB POP CHG prep, sterile tech #22 echo/stim needle with stim down to .48ma Multiple neg asp Marc .5% w/epi 1:200000 total 25cc+decadron 4mg  infiltrated No compl Dr Gypsy Balsam   Procedure Name: LMA Insertion Date/Time: 03/14/2013 11:11 AM Performed by: Sherie Don Pre-anesthesia Checklist: Patient identified, Emergency Drugs available, Suction available, Patient being monitored and Timeout performed Patient Re-evaluated:Patient Re-evaluated prior to inductionOxygen Delivery Method: Circle system utilized Preoxygenation: Pre-oxygenation with 100% oxygen Intubation Type: IV induction LMA: LMA inserted and LMA with gastric port inserted LMA Size: 5.0 Placement Confirmation: positive ETCO2 and breath sounds checked- equal and bilateral Tube secured with: Tape

## 2013-03-14 NOTE — Transfer of Care (Signed)
Immediate Anesthesia Transfer of Care Note  Patient: Kevin Pacheco  Procedure(s) Performed: Procedure(s): total knee revision (Left)  Patient Location: PACU  Anesthesia Type:General  Level of Consciousness: awake and patient cooperative  Airway & Oxygen Therapy: Patient Spontanous Breathing and Patient connected to face mask oxygen  Post-op Assessment: Report given to PACU RN, Post -op Vital signs reviewed and stable and Patient moving all extremities X 4  Post vital signs: Reviewed and stable  Complications: No apparent anesthesia complications

## 2013-03-14 NOTE — Preoperative (Signed)
Beta Blockers   Reason not to administer Beta Blockers:Not Applicable 

## 2013-03-14 NOTE — OR Nursing (Signed)
Late entry by Timoteo Expose, RN at [phone] to add drain as LDA.

## 2013-03-15 ENCOUNTER — Encounter (HOSPITAL_COMMUNITY): Payer: Self-pay | Admitting: Orthopedic Surgery

## 2013-03-15 MED ORDER — OXYCODONE HCL 5 MG PO TABS: 5.0000 mg | ORAL_TABLET | ORAL | Status: DC | PRN

## 2013-03-15 MED ORDER — CELECOXIB 200 MG PO CAPS: 200.0000 mg | ORAL_CAPSULE | Freq: Two times a day (BID) | ORAL | Status: DC

## 2013-03-15 MED ORDER — OXYCODONE HCL ER 10 MG PO T12A: 10.0000 mg | EXTENDED_RELEASE_TABLET | Freq: Two times a day (BID) | ORAL | Status: DC

## 2013-03-15 MED ORDER — ENOXAPARIN SODIUM 40 MG/0.4ML ~~LOC~~ SOLN: 40.0000 mg | SUBCUTANEOUS | Status: DC

## 2013-03-15 MED ORDER — METHOCARBAMOL 500 MG PO TABS: 500.0000 mg | ORAL_TABLET | Freq: Four times a day (QID) | ORAL | Status: DC | PRN

## 2013-03-15 NOTE — Progress Notes (Signed)
Physical Therapy Note   03/15/13 1600  PT Visit Information  Last PT Received On 03/15/13  Assistance Needed +1  PT Time Calculation  PT Start Time 1547  PT Stop Time 1616  PT Time Calculation (min) 29 min  Subjective Data  Subjective wants to work  Patient Stated Goal less painful knee  Precautions  Precautions Knee  Restrictions  LLE Weight Bearing WBAT  Cognition  Arousal/Alertness Awake/alert  Behavior During Therapy WFL for tasks assessed/performed  Overall Cognitive Status Within Functional Limits for tasks assessed  Bed Mobility  Bed Mobility Supine to Sit;Sitting - Scoot to Edge of Bed  Supine to Sit 5: Supervision  Sitting - Scoot to Edge of Bed 5: Supervision  Details for Bed Mobility Assistance demonstrated good technique.  Transfers  Transfers Sit to Stand;Stand to Sit  Sit to Stand 5: Supervision;With armrests  Stand to Sit 5: Supervision;With upper extremity assist  Details for Transfer Assistance cues for hand placement  Ambulation/Gait  Ambulation/Gait Assistance 5: Supervision  Ambulation Distance (Feet) 200 Feet  Assistive device Rolling walker  Ambulation/Gait Assistance Details smooth gait with no noted L knee buckle; doing great  Gait Pattern Step-through pattern  Exercises  Exercises Total Joint  Total Joint Exercises  Quad Sets AROM;Left;10 reps;Supine  Long Arc Quad AROM;Left;10 reps;Seated  Knee Flexion AROM;AAROM;10 reps;Left;Seated  PT - End of Session  Activity Tolerance Patient tolerated treatment well  Patient left in bed;in CPM;with call bell/phone within reach;with family/visitor present  Nurse Communication Mobility status  PT - Assessment/Plan  Comments on Treatment Session Continuing to make excellent progress; on track for dc home tomorrow  PT Plan Discharge plan remains appropriate  PT Frequency 7X/week  Follow Up Recommendations Home health PT;Supervision - Intermittent  PT equipment None recommended by PT  Acute Rehab PT  Goals  Time For Goal Achievement 03/22/13  Potential to Achieve Goals Good  Pt will go Supine/Side to Sit Independently  PT Goal: Supine/Side to Sit - Progress Progressing toward goal  Pt will go Sit to Supine/Side Independently  PT Goal: Sit to Supine/Side - Progress Progressing toward goal  Pt will go Sit to Stand with modified independence  PT Goal: Sit to Stand - Progress Progressing toward goal  Pt will go Stand to Sit with modified independence  PT Goal: Stand to Sit - Progress Progressing toward goal  Pt will Ambulate >150 feet;with modified independence;with rolling walker;with least restrictive assistive device  PT Goal: Ambulate - Progress Progressing toward goal  Pt will Perform Home Exercise Program Independently  PT Goal: Perform Home Exercise Program - Progress Progressing toward goal  PT General Charges  $$ ACUTE PT VISIT 1 Procedure  PT Treatments  $Gait Training 8-22 mins  $Therapeutic Exercise 8-22 mins  [place], [place] [phone]

## 2013-03-15 NOTE — Progress Notes (Signed)
SPORTS MEDICINE AND JOINT REPLACEMENT  Georgena Spurling, MD   Altamese Cabal, PA-C [address] [place], [place], [place]  [address]                             [phone]   PROGRESS NOTE  Subjective:  negative for Chest Pain  negative for Shortness of Breath  negative for Nausea/Vomiting   negative for Calf Pain  negative for Bowel Movement   Tolerating Diet: yes         Patient reports pain as 5 on 0-10 scale.    Objective: Vital signs in last 24 hours:   Patient Vitals for the past 24 hrs:  BP Temp Pulse Resp SpO2  03/15/13 1423 107/48 mmHg 98.8 F (37.1 C) 93 18 98 %  03/15/13 0638 136/68 mmHg 97.9 F (36.6 C) 74 18 99 %  03/14/13 1856 130/68 mmHg 98.1 F (36.7 C) 79 15 100 %  03/14/13 1600 143/76 mmHg 98.2 F (36.8 C) 71 14 99 %    @flow {1959:LAST@   Intake/Output from previous day:   04/21 0701 - 04/22 0700 In: 2890 [P.O.:480; I.V.:2410] Out: 1725 [Urine:700; Drains:625]   Intake/Output this shift:   04/22 0701 - 04/22 1900 In: 480 [P.O.:480] Out: 700 [Urine:700]   Intake/Output     04/21 0701 - 04/22 0700 04/22 0701 - 04/23 0700   P.O. 480 480   I.V. 2410    Total Intake 2890 480   Urine 700 700   Drains 625    Other 400    Total Output 1725 700   Net +1165 -220           LABORATORY DATA:  Recent Labs  03/14/13 1529  WBC 9.0  HGB 12.8*  HCT 36.4*  PLT 180    Recent Labs  03/14/13 1529  CREATININE 0.96   Lab Results  Component Value Date   INR 0.83 03/07/2013   INR 0.96 01/31/2011    Examination:  General appearance: alert, cooperative and no distress Extremities: Homans sign is negative, no sign of DVT  Wound Exam: clean, dry, intact   Drainage:  None: wound tissue dry  Motor Exam: EHL and FHL Intact  Sensory Exam: Deep Peroneal normal   Assessment:    1 Day Post-Op  Procedure(s) (LRB): total knee revision (Left)  ADDITIONAL DIAGNOSIS:  Active Problems:   * No active hospital problems. *  Acute Blood Loss  Anemia   Plan: Physical Therapy as ordered Weight Bearing as Tolerated (WBAT)  DVT Prophylaxis:  Lovenox  DISCHARGE PLAN: Home  DISCHARGE NEEDS: HHPT, CPM, Walker and 3-in-1 comode seat         Shadee Rathod 03/15/2013, 3:33 PM

## 2013-03-15 NOTE — Evaluation (Signed)
Occupational Therapy Evaluation Patient Details Name: Kevin Pacheco MRN: [id number] DOB: 27-Sep-1941 Today's Date: 03/15/2013 Time: 8295-6213 OT Time Calculation (min): 49 min  OT Assessment / Plan / Recommendation Clinical Impression    72 yo s/p LTKA presents moving quite well; Pt at Minguard level for LB ADLs and tub transfer. Pt progressing well and appropriate level for d/c. Will have assistance as needed from neighbors and wife.     OT Assessment  Patient does not need any further OT services    Follow Up Recommendations  No OT follow up;Supervision - Intermittent    Barriers to Discharge   None       Equipment Recommendations  None recommended by OT    Recommendations for Other Services    Frequency       Precautions / Restrictions Precautions Precautions: Knee Restrictions Weight Bearing Restrictions: Yes LLE Weight Bearing: Weight bearing as tolerated   Pertinent Vitals/Pain 5/10 pain in knee with activity. Premedicated.    ADL  Eating/Feeding: Independent Where Assessed - Eating/Feeding: Chair Grooming: Min guard Where Assessed - Grooming: Supported standing Upper Body Bathing: Set up Where Assessed - Upper Body Bathing: Unsupported sitting Lower Body Bathing: Min guard Where Assessed - Lower Body Bathing: Supported sit to stand Upper Body Dressing: Set up Where Assessed - Upper Body Dressing: Unsupported sitting Lower Body Dressing: Min guard Where Assessed - Lower Body Dressing: Supported sit to Pharmacist, hospital: Hydrographic surveyor Method: Sit to Barista: Comfort height toilet;Grab bars Toileting - Architect and Hygiene: Min guard Where Assessed - Engineer, mining and Hygiene: Standing Tub/Shower Transfer: Insurance risk surveyor Method: Science writer: Shower seat with back Equipment Used: Gait belt;Rolling walker Transfers/Ambulation Related to ADLs:  Minguard for ambulation and transfers.  ADL Comments: Pt at Minguard level for LB ADLs with sit to stand transfer. Pt able to reach and don/doff socks while sitting. Pt practiced tub transfer and able to step over tub and perform transfer with minguard assist.     OT Diagnosis:    OT Problem List:   OT Treatment Interventions:     OT Goals    Visit Information  Last OT Received On: 03/15/13 Assistance Needed: +1 PT/OT Co-Evaluation/Treatment: Yes    Subjective Data      Prior Functioning     Home Living Lives With: Spouse Available Help at Discharge: Family;Available PRN/intermittently (wife works part time/neighbors can come assist as needed) Type of Home: House Home Access: Stairs to enter [employer] of Steps: 2 Entrance Stairs-Rails: None Home Layout: One level Bathroom Shower/Tub: Engineer, manufacturing systems: Handicapped height Bathroom Accessibility: Yes How Accessible: Accessible via walker Home Adaptive Equipment: Walker - rolling;Straight cane;Crutches;Bedside commode/3-in-1 Prior Function Level of Independence: Independent Able to Take Stairs?: Yes Driving: Yes Vocation: Part time employment Communication Communication: No difficulties Dominant Hand: Right         Vision/Perception     Cognition  Cognition Arousal/Alertness: Awake/alert Behavior During Therapy: WFL for tasks assessed/performed Overall Cognitive Status: Within Functional Limits for tasks assessed    Extremity/Trunk Assessment Right Upper Extremity Assessment RUE ROM/Strength/Tone: [facility] for tasks assessed Left Upper Extremity Assessment LUE ROM/Strength/Tone: WFL for tasks assessed    Mobility Bed Mobility Bed Mobility: Supine to Sit;Sitting - Scoot to Edge of Bed Supine to Sit: 5: Supervision;HOB elevated Sitting - Scoot to Edge of Bed: 5: Supervision Details for Bed Mobility Assistance: demonstrated good technique. Transfers Transfers: Sit to Stand;Stand to  Sit Sit  to Stand: 4: Min guard;With upper extremity assist;From chair/3-in-1;From bed;From toilet Stand to Sit: 4: Min guard;With upper extremity assist;To chair/3-in-1;To toilet Details for Transfer Assistance: cues for hand placement     Exercise     Balance     End of Session OT - End of Session Equipment Utilized During Treatment: Gait belt Activity Tolerance: Patient tolerated treatment well Patient left: in chair;with call bell/phone within reach;with family/visitor present  GO     Earlie Raveling OTR/L [phone] 03/15/2013, 3:25 PM

## 2013-03-15 NOTE — Progress Notes (Signed)
Physical Therapy Evaluation Patient Details Name: Kevin Pacheco MRN: [id number] DOB: [birth year] Today's Date: 03/15/2013 Time: 8295-6213 PT Time Calculation (min): 25 min  PT Assessment / Plan / Recommendation Clinical Impression  72 yo s/p LTKA presents to PT moving quite well; Will benefit from PT to maximize independence and safety with mobility, amb and enable safe dc home    PT Assessment  Patient needs continued PT services    Follow Up Recommendations  Home health PT;Supervision - Intermittent    Does the patient have the potential to tolerate intense rehabilitation      Barriers to Discharge None Wife works, and pt will be home alone, but he should be able to manage independently and safely    Equipment Recommendations  None recommended by PT    Recommendations for Other Services     Frequency 7X/week    Precautions / Restrictions Precautions Precautions: Knee Restrictions Weight Bearing Restrictions: Yes LLE Weight Bearing: Weight bearing as tolerated   Pertinent Vitals/Pain 5/10 left knee pain with increased activity Was premedicated      Mobility  Bed Mobility Bed Mobility: Supine to Sit;Sitting - Scoot to Edge of Bed Supine to Sit: 5: Supervision;HOB elevated Sitting - Scoot to Edge of Bed: 5: Supervision Details for Bed Mobility Assistance: demonstrated good technique. Transfers Transfers: Sit to Stand;Stand to Sit Sit to Stand: 4: Min guard;With upper extremity assist;From chair/3-in-1;From bed;From toilet Stand to Sit: 4: Min guard;With upper extremity assist;To chair/3-in-1;To toilet Details for Transfer Assistance: cues for hand placement Ambulation/Gait Ambulation/Gait Assistance: 4: Min guard (with and without physical contact) Ambulation Distance (Feet): 150 Feet Assistive device: Rolling walker Ambulation/Gait Assistance Details: cues to activate quad for L stance stability; very good knee control, abl eto advance to step-through gait  safely with RW    Exercises     PT Diagnosis: Acute pain;Abnormality of gait  PT Problem List: Decreased strength;Decreased range of motion;Decreased mobility;Decreased knowledge of precautions;Pain PT Treatment Interventions: DME instruction;Gait training;Stair training;Functional mobility training;Therapeutic activities;Therapeutic exercise;Patient/family education   PT Goals Acute Rehab PT Goals PT Goal Formulation: With patient Time For Goal Achievement: 03/22/13 Potential to Achieve Goals: Good Pt will go Supine/Side to Sit: Independently PT Goal: Supine/Side to Sit - Progress: Goal set today Pt will go Sit to Supine/Side: Independently PT Goal: Sit to Supine/Side - Progress: Goal set today Pt will go Sit to Stand: with modified independence PT Goal: Sit to Stand - Progress: Goal set today Pt will go Stand to Sit: with modified independence PT Goal: Stand to Sit - Progress: Goal set today Pt will Ambulate: >150 feet;with modified independence;with rolling walker;with least restrictive assistive device PT Goal: Ambulate - Progress: Goal set today Pt will Go Up / Down Stairs: 1-2 stairs;with min assist;with least restrictive assistive device;with rolling walker PT Goal: Up/Down Stairs - Progress: Goal set today Pt will Perform Home Exercise Program: Independently PT Goal: Perform Home Exercise Program - Progress: Goal set today  Visit Information  Last PT Received On: 03/15/13 Assistance Needed: +1 PT/OT Co-Evaluation/Treatment: Yes    Subjective Data  Subjective: "I got this" re: mobility Patient Stated Goal: less painful knee   Prior Functioning  Home Living Lives With: Spouse Available Help at Discharge: Family;Available PRN/intermittently Type of Home: House Home Access: Stairs to enter [employer] of Steps: 2 Entrance Stairs-Rails: None Home Layout: One level Bathroom Shower/Tub: Engineer, manufacturing systems: Handicapped height Bathroom  Accessibility: Yes How Accessible: Accessible via walker Home Adaptive Equipment: Walker - rolling;Straight cane;Crutches;Bedside commode/3-in-1  Prior Function Level of Independence: Independent Able to Take Stairs?: Yes Driving: Yes Vocation: Part time employment Communication Communication: No difficulties Dominant Hand: Right    Cognition  Cognition Arousal/Alertness: Awake/alert Behavior During Therapy: WFL for tasks assessed/performed Overall Cognitive Status: Within Functional Limits for tasks assessed    Extremity/Trunk Assessment Right Upper Extremity Assessment RUE ROM/Strength/Tone: [facility] for tasks assessed Left Upper Extremity Assessment LUE ROM/Strength/Tone: WFL for tasks assessed Right Lower Extremity Assessment RLE ROM/Strength/Tone: Within functional levels Left Lower Extremity Assessment LLE ROM/Strength/Tone: Deficits LLE ROM/Strength/Tone Deficits: limited by postop pain Trunk Assessment Trunk Assessment: Normal   Balance    End of Session PT - End of Session Equipment Utilized During Treatment: Gait belt Activity Tolerance: Patient tolerated treatment well Patient left: in chair;with call bell/phone within reach (with OT) Nurse Communication: Mobility status  GP     Van Clines [facility] [place], [place] [phone]  03/15/2013, 2:55 PM

## 2013-03-15 NOTE — Progress Notes (Signed)
UR COMPLETED  

## 2013-03-16 NOTE — Progress Notes (Signed)
Patient provided with discharge instructions and follow up information. He was educated on the use of Lovenox injections daily. [facility] HHPT will be doing further rehabilitation with the patient. He is being discharged home with his wife.

## 2013-03-16 NOTE — Discharge Summary (Signed)
SPORTS MEDICINE & JOINT REPLACEMENT   Georgena Spurling, MD   Altamese Cabal, PA-C [address] [place], [place], [place]  [address]                             [phone]  PATIENT ID: Kevin Pacheco        MRN:  [id number]          DOB/AGE: 1941-06-18 / 72 y.o.    DISCHARGE SUMMARY  ADMISSION DATE:    03/14/2013 DISCHARGE DATE:   03/16/2013   ADMISSION DIAGNOSIS: painful hardware left total knee, possible infection    DISCHARGE DIAGNOSIS:  painful hardware left total knee, possible infection    ADDITIONAL DIAGNOSIS: Active Problems:   * No active hospital problems. *  Past Medical History  Diagnosis Date  . Hypertension   . GERD (gastroesophageal reflux disease)   . Headache   . Cancer     prostate  . Arthritis     PROCEDURE: Procedure(s): total knee revision on 03/14/2013  CONSULTS:     HISTORY:  See H&P in chart  HOSPITAL COURSE:  Kevin Pacheco is a 72 y.o. admitted on 03/14/2013 and found to have a diagnosis of painful hardware left total knee, possible infection.  After appropriate laboratory studies were obtained  they were taken to the operating room on 03/14/2013 and underwent Procedure(s): total knee revision.   They were given perioperative antibiotics:  Anti-infectives   Start     Dose/Rate Route Frequency Ordered Stop   03/14/13 1700  ceFAZolin (ANCEF) IVPB 2 g/50 mL premix     2 g 100 mL/hr over 30 Minutes Intravenous Every 6 hours 03/14/13 1604 03/14/13 2300    .  Tolerated the procedure well.  Placed with a foley intraoperatively.  Given Ofirmev at induction and for 48 hours.    POD# 1: Vital signs were stable.  Patient denied Chest pain, shortness of breath, or calf pain.  Patient was started on Lovenox 30 mg subcutaneously twice daily at 8am.  Consults to PT, OT, and care management were made.  The patient was weight bearing as tolerated.  CPM was placed on the operative leg 0-90 degrees for 6-8 hours a day.  Incentive spirometry was taught.   Dressing was changed.  Marcaine pump and hemovac were discontinued.      POD #2, Continued  PT for ambulation and exercise program.  IV saline locked.  O2 discontinued.    The remainder of the hospital course was dedicated to ambulation and strengthening.   The patient was discharged on 2 Days Post-Op in  Good condition.  Blood products given:none  DIAGNOSTIC STUDIES: Recent vital signs: Patient Vitals for the past 24 hrs:  BP Temp Temp src Pulse Resp SpO2  03/16/13 0555 147/55 mmHg 97.4 F (36.3 C) Oral 85 18 98 %  03/15/13 2055 109/62 mmHg 98.4 F (36.9 C) Oral 90 18 98 %       Recent laboratory studies:  Recent Labs  03/14/13 1529  WBC 9.0  HGB 12.8*  HCT 36.4*  PLT 180    Recent Labs  03/14/13 1529  CREATININE 0.96   Lab Results  Component Value Date   INR 0.83 03/07/2013   INR 0.96 01/31/2011     Recent Radiographic Studies :  Dg Chest 2 View  03/07/2013  *RADIOLOGY REPORT*  Clinical Data: Preop radiograph.  Left knee surgery.  CHEST - 2 VIEW  Comparison: 01/31/2011  Findings:  The heart size and mediastinal contours are within normal limits.  Both lungs are clear.  The visualized skeletal structures are unremarkable.  IMPRESSION: Negative exam.   Original Report Authenticated By: Signa Kell, M.D.     DISCHARGE INSTRUCTIONS: Discharge Orders   Future Orders Complete By Expires     CPM  As directed     Comments:      Continuous passive motion machine (CPM):      Use the CPM from 0 to 90 for 6-8 hours per day.      You may increase by 10 per day.  You may break it up into 2 or 3 sessions per day.      Use CPM for 2 weeks or until you are told to stop.    Call MD / Call 911  As directed     Comments:      If you experience chest pain or shortness of breath, CALL 911 and be transported to the hospital emergency room.  If you develope a fever above 101 F, pus (white drainage) or increased drainage or redness at the wound, or calf pain, call your surgeon's  office.    Change dressing  As directed     Comments:      Change dressing on thursday, then change the dressing daily with sterile 4 x 4 inch gauze dressing and apply TED hose.    Constipation Prevention  As directed     Comments:      Drink plenty of fluids.  Prune juice may be helpful.  You may use a stool softener, such as Colace (over the counter) 100 mg twice a day.  Use MiraLax (over the counter) for constipation as needed.    Diet - low sodium heart healthy  As directed     Do not put a pillow under the knee. Place it under the heel.  As directed     Driving restrictions  As directed     Comments:      No driving for 6 weeks    Increase activity slowly as tolerated  As directed     Lifting restrictions  As directed     Comments:      No lifting for 6 weeks    TED hose  As directed     Comments:      Use stockings (TED hose) for 3 weeks on both leg(s).  You may remove them at night for sleeping.       DISCHARGE MEDICATIONS:     Medication List    STOP taking these medications       aspirin EC 81 MG tablet      TAKE these medications       celecoxib 200 MG capsule  Commonly known as:  CELEBREX  Take 1 capsule (200 mg total) by mouth every 12 (twelve) hours.     enoxaparin 40 MG/0.4ML injection  Commonly known as:  LOVENOX  Inject 0.4 mLs (40 mg total) into the skin daily.     enoxaparin 40 MG/0.4ML injection  Commonly known as:  LOVENOX  Inject 0.4 mLs (40 mg total) into the skin daily.     lisinopril 20 MG tablet  Commonly known as:  PRINIVIL,ZESTRIL  Take 20 mg by mouth daily with breakfast.     methocarbamol 500 MG tablet  Commonly known as:  ROBAXIN  Take 1 tablet (500 mg total) by mouth every 6 (six) hours as needed.     omeprazole 40  MG capsule  Commonly known as:  PRILOSEC  Take 40 mg by mouth daily with breakfast.     OxyCODONE 10 mg T12a  Commonly known as:  OXYCONTIN  Take 1 tablet (10 mg total) by mouth every 12 (twelve) hours.      oxyCODONE 5 MG immediate release tablet  Commonly known as:  Oxy IR/ROXICODONE  Take 1-2 tablets (5-10 mg total) by mouth every 4 (four) hours as needed.     simvastatin 40 MG tablet  Commonly known as:  ZOCOR  Take 40 mg by mouth daily with breakfast.        FOLLOW UP VISIT:       Follow-up Information   Follow up with Raymon Mutton, MD. Call on 03/29/2013.   Contact information:   [address] [place] [address] [phone]       DISPOSITION: HOME VS. SNF  CONDITION:  Good   Heatherly Stenner 03/16/2013, 6:27 PM

## 2013-03-16 NOTE — Op Note (Signed)
Dictation number:  [id number]

## 2013-03-16 NOTE — Care Management Note (Signed)
CARE MANAGEMENT NOTE 03/16/2013  Patient:  Kevin Pacheco, Kevin Pacheco   Account Number:  [id number]  Date Initiated:  03/16/2013  Documentation initiated by:  Vance Peper  Subjective/Objective Assessment:   72 yr old male s/p left total knee arthroplasty.     Action/Plan:   Patient is under worker's comp. Home Health has been arranged by his adjuster with [employer]. DME has been delivered to the patient's home.   Anticipated DC Date:  03/16/2013   Anticipated DC Plan:  HOME W HOME HEALTH SERVICES      DC Planning Services  CM consult      [facility] Choice  HOME HEALTH  DURABLE MEDICAL EQUIPMENT   Choice offered to / List presented to:     DME arranged  [facility]  3-N-1  CPM      DME agency  [employer]     HH arranged  HH-2 PT      HH agency  [facility]   Status of service:  Completed, signed off Medicare Important Message given?   (If response is "NO", the following Medicare IM given date fields will be blank) Date Medicare IM given:   Date Additional Medicare IM given:    Discharge Disposition:  HOME W HOME HEALTH SERVICES  Per UR Regulation:    If discussed at Long Length of Stay Meetings, dates discussed:    Comments:

## 2013-03-16 NOTE — Progress Notes (Signed)
Physical Therapy Treatment Patient Details Name: Kevin Pacheco MRN: [id number] DOB: Jun 21, 1941 Today's Date: 03/16/2013 Time: 7829-5621 PT Time Calculation (min): 21 min  PT Assessment / Plan / Recommendation Comments on Treatment Session  Excellent progress to walking safely with straight cane; Stair training complete; OK fo rdc from PT standpoint    Follow Up Recommendations  Home health PT     Does the patient have the potential to tolerate intense rehabilitation     Barriers to Discharge        Equipment Recommendations  None recommended by PT    Recommendations for Other Services    Frequency 7X/week   Plan Discharge plan remains appropriate    Precautions / Restrictions Precautions Precautions: Knee Restrictions Weight Bearing Restrictions: Yes LLE Weight Bearing: Weight bearing as tolerated   Pertinent Vitals/Pain 5/10; pt reports is tolerable    Mobility  Bed Mobility Bed Mobility: Supine to Sit;Sitting - Scoot to Edge of Bed Supine to Sit: 6: Modified independent (Device/Increase time) Sitting - Scoot to Edge of Bed: 6: Modified independent (Device/Increase time) Details for Bed Mobility Assistance: demonstrated good technique. Transfers Transfers: Sit to Stand;Stand to Sit Sit to Stand: 6: Modified independent (Device/Increase time) Stand to Sit: 6: Modified independent (Device/Increase time) Details for Transfer Assistance: Good technique, including on/off low mat table Ambulation/Gait Ambulation/Gait Assistance: 5: Supervision;6: Modified independent (Device/Increase time) Ambulation Distance (Feet): 300 Feet (150 with Rw, 150 with straight cane) Assistive device: Rolling walker;Straight cane Ambulation/Gait Assistance Details: Excellent Left knee stance stability; abel to rogress safely to cane Gait Pattern: Step-through pattern Stairs: Yes Stairs Assistance: 4: Min guard (without physical contact) Stair Management Technique: No rails;With  cane;Forwards Number of Stairs: 3 (verbal and demo cues for technique and sequence)    Exercises     PT Diagnosis:    PT Problem List:   PT Treatment Interventions:     PT Goals Acute Rehab PT Goals Time For Goal Achievement: 03/22/13 Potential to Achieve Goals: Good Pt will go Supine/Side to Sit: Independently PT Goal: Supine/Side to Sit - Progress: Met Pt will go Sit to Stand: with modified independence PT Goal: Sit to Stand - Progress: Met Pt will go Stand to Sit: with modified independence PT Goal: Stand to Sit - Progress: Met Pt will Ambulate: >150 feet;with modified independence;with rolling walker;with least restrictive assistive device PT Goal: Ambulate - Progress: Met Pt will Go Up / Down Stairs: 1-2 stairs;with min assist;with least restrictive assistive device;with rolling walker PT Goal: Up/Down Stairs - Progress: Met Pt will Perform Home Exercise Program: Independently PT Goal: Perform Home Exercise Program - Progress: Progressing toward goal  Visit Information  Last PT Received On: 03/16/13 Assistance Needed: +1    Subjective Data  Subjective: wants to work Patient Stated Goal: less painful knee   Cognition  Cognition Arousal/Alertness: Awake/alert Behavior During Therapy: WFL for tasks assessed/performed Overall Cognitive Status: Within Functional Limits for tasks assessed    Balance     End of Session PT - End of Session Activity Tolerance: Patient tolerated treatment well Patient left: in chair;with call bell/phone within reach Nurse Communication: Mobility status CPM Left Knee CPM Left Knee: Off   GP     Kevin Pacheco [place], [place] [phone]  03/16/2013, 11:01 AM

## 2013-03-17 NOTE — Op Note (Signed)
NAMEMarland Kitchen  KEOLA, HENINGER NO.:  [id number]  MEDICAL RECORD NO.:  [id number]  LOCATION:  [id number]                        FACILITY:  [facility]  PHYSICIAN:  Mila Homer. Sherlean Foot, M.D. DATE OF BIRTH:  Apr 26, 1941  DATE OF PROCEDURE:  03/14/2013 DATE OF DISCHARGE:  03/16/2013                              OPERATIVE REPORT   SURGEON:  Mila Homer. Sherlean Foot, M.D.  ASSISTED BY:  Laural Benes. Jannet Mantis.  ANESTHESIA:  General.  PREOPERATIVE DIAGNOSIS:  Left failed total knee arthroplasty.  POSTOPERATIVE DIAGNOSIS:  Left failed total knee arthroplasty.  PROCEDURE:  Left total knee revision.  INDICATION FOR PROCEDURE:  The patient is a 72 year old white male, 2 years out from a primary total knee arthroplasty of the left knee.  He presented with pain, swelling, and bone scan showed complete loosening of the component.  Infection was ruled out and informed consent was obtained.  DESCRIPTION OF PROCEDURE:  The patient was laid supine and administered general anesthesia.  The left leg was prepped and draped in usual sterile fashion.  After a sterile prep and drape, a midline incision was made with a #10 blade.  The old incision was used.  I then used a new 10- blade to make a median parapatellar arthrotomy and to perform a synovectomy.  The synovectomy was considerable, but it did increase his motion from preop of 5-90 to 0-115.  It was obvious that there was a loosening of both the femoral and tibial components, but not the patellar complement.  I then completed my synovectomy and then used a micro saw to create an interface between the cement and the components. I easily removed the polyethylene, amputating the keel off of the plastic and removed that.  I then used the femoral component distractor and removed the femoral component.  I then curetted and rongeured out all of the debris and loose bone.  There was no significant defect other than full 5-mm defect posteriorly.  At this point, I  then turned my attention to the tibia, I got that out with one tip of the bone tamp and then removed the cement by pressing into pieces with 0.25-inch osteotome in small amount.  Once I cleaned everything out, I irrigated it copiously.  I then reamed up to 20 on the femur and 15 on the tibia.  I then recut my tibia with an extramedullary alignment guide.  I then used a size F sagittal sizers to size the femur and then placed only a 20-mm stem on the box cut and couple to the box.  I used two 5-mm augments due to the bone loss, this perfectly recreated the femur.  The tibia was easily prepared with a size 4 tray drilling keel.  I then trialed the femur and tibial components, and ended up with 17 LPS polyethylene trial.  I then chose these components, the patella did track well.  I then removed all of the components and copiously irrigated.  I mixed the [place] R-G cement and then put the components and removing all excess cement, allowing the cement to harden in extension.  I then left a Hemovac coming out superolateral and a pain catheter coming out superomedial and superficial to the arthrotomy.  I let the tourniquet down, this was 1 hour and 8 minutes.  I then irrigated and obtained hemostasis.  I then closed the arthrotomy with figure-of-8 #1 Vicryl sutures, buried with 0 Vicryl sutures, subcuticular with 2-0 Vicryl sutures and skin staples.  Dressed with Xeroform, dressing, sponges, sterile Webril, and Ace wrap.          ______________________________ Mila Homer. Sherlean Foot, M.D.     SDL/MEDQ  D:  03/16/2013  T:  03/17/2013  Job:  [id number]

## 2013-03-18 LAB — BODY FLUID CULTURE

## 2015-01-01 DIAGNOSIS — Z6828 Body mass index (BMI) 28.0-28.9, adult: Secondary | ICD-10-CM | POA: Diagnosis not present

## 2015-01-01 DIAGNOSIS — L609 Nail disorder, unspecified: Secondary | ICD-10-CM | POA: Diagnosis not present

## 2015-01-01 DIAGNOSIS — I10 Essential (primary) hypertension: Secondary | ICD-10-CM | POA: Diagnosis not present

## 2015-01-08 ENCOUNTER — Ambulatory Visit (INDEPENDENT_AMBULATORY_CARE_PROVIDER_SITE_OTHER): Payer: Commercial Managed Care - HMO | Admitting: Podiatry

## 2015-01-08 ENCOUNTER — Encounter: Payer: Self-pay | Admitting: Podiatry

## 2015-01-08 VITALS — BP 142/70 | HR 69 | Resp 12

## 2015-01-08 DIAGNOSIS — L03031 Cellulitis of right toe: Secondary | ICD-10-CM | POA: Diagnosis not present

## 2015-01-08 DIAGNOSIS — L03011 Cellulitis of right finger: Secondary | ICD-10-CM

## 2015-01-08 NOTE — Patient Instructions (Signed)

## 2015-01-08 NOTE — Progress Notes (Signed)
   Subjective:    Patient ID: Kevin Roup., male    DOB: 1941-05-20, 74 y.o.   MRN: [id number]  HPI  PT STATED RT FOOT GREAT TOE IS SWOLLEN AND SORE FOR 2 WEEKS. THE GREAT TOENAIL IS GETTING BETTER BUT GET AGGRAVATED BY PRESSURE. TRIED WITH EPSON SALT AND IT HELP SOME.  Review of Systems  All other systems reviewed and are negative.      Objective:   Physical Exam        Assessment & Plan:

## 2015-01-09 NOTE — Progress Notes (Signed)
Subjective:     Patient ID: Kevin Roup., male   DOB: 1941/11/04, 74 y.o.   MRN: [id number]  HPI patient presents stating my right big toe has been irritated and draining on the inside. I do not remember injuring it but it is possible that it occur   Review of Systems  All other systems reviewed and are negative.      Objective:   Physical Exam  Constitutional: He is oriented to person, place, and time.  Cardiovascular: Intact distal pulses.   Musculoskeletal: Normal range of motion.  Neurological: He is oriented to person, place, and time.  Skin: Skin is warm and dry.  Nursing note and vitals reviewed.  neurovascular status found to be intact with muscle strength adequate and range of motion subtalar and midtarsal joint within normal limits. Patient's noted to have good digital perfusion is well oriented 3 and on the right hallux lateral side there is some drainage on the proximal portion and signs of trauma. No significant drainage on the medial side     Assessment:     Probable trauma to the right big toe leading to paronychia infection of the localized nature    Plan:     H&P and condition explained to patient. Today I infiltrated the right hallux 60 mg Xylocaine Marcaine mixture and remove the lateral border and removed proud flesh and abscess tissue from the proximal portion. I did advise he may lose the rest of his nail due to trauma but at this time and allow it to stay adhered and hope that it heals. Instructed on soaks and sterile dressing applied and reappoint to recheck

## 2015-01-15 ENCOUNTER — Ambulatory Visit: Payer: Self-pay | Admitting: Podiatry

## 2015-01-16 DIAGNOSIS — C61 Malignant neoplasm of prostate: Secondary | ICD-10-CM | POA: Diagnosis not present

## 2015-03-01 DIAGNOSIS — C61 Malignant neoplasm of prostate: Secondary | ICD-10-CM | POA: Diagnosis not present

## 2015-03-16 DIAGNOSIS — C61 Malignant neoplasm of prostate: Secondary | ICD-10-CM | POA: Diagnosis not present

## 2015-03-16 DIAGNOSIS — R823 Hemoglobinuria: Secondary | ICD-10-CM | POA: Diagnosis not present

## 2015-04-05 DIAGNOSIS — N359 Urethral stricture, unspecified: Secondary | ICD-10-CM | POA: Diagnosis not present

## 2015-04-05 DIAGNOSIS — R312 Other microscopic hematuria: Secondary | ICD-10-CM | POA: Diagnosis not present

## 2015-04-05 DIAGNOSIS — C61 Malignant neoplasm of prostate: Secondary | ICD-10-CM | POA: Diagnosis not present

## 2015-04-05 DIAGNOSIS — I77811 Abdominal aortic ectasia: Secondary | ICD-10-CM | POA: Diagnosis not present

## 2015-04-05 DIAGNOSIS — I723 Aneurysm of iliac artery: Secondary | ICD-10-CM | POA: Diagnosis not present

## 2015-04-10 DIAGNOSIS — E785 Hyperlipidemia, unspecified: Secondary | ICD-10-CM | POA: Diagnosis not present

## 2015-04-10 DIAGNOSIS — I714 Abdominal aortic aneurysm, without rupture: Secondary | ICD-10-CM | POA: Diagnosis not present

## 2015-04-10 DIAGNOSIS — R7301 Impaired fasting glucose: Secondary | ICD-10-CM | POA: Diagnosis not present

## 2015-04-10 DIAGNOSIS — C61 Malignant neoplasm of prostate: Secondary | ICD-10-CM | POA: Diagnosis not present

## 2015-04-10 DIAGNOSIS — I6529 Occlusion and stenosis of unspecified carotid artery: Secondary | ICD-10-CM | POA: Diagnosis not present

## 2015-04-10 DIAGNOSIS — I10 Essential (primary) hypertension: Secondary | ICD-10-CM | POA: Diagnosis not present

## 2015-04-10 DIAGNOSIS — M545 Low back pain: Secondary | ICD-10-CM | POA: Diagnosis not present

## 2015-04-10 DIAGNOSIS — R809 Proteinuria, unspecified: Secondary | ICD-10-CM | POA: Diagnosis not present

## 2015-05-17 DIAGNOSIS — C61 Malignant neoplasm of prostate: Secondary | ICD-10-CM | POA: Diagnosis not present

## 2015-05-22 DIAGNOSIS — Z96652 Presence of left artificial knee joint: Secondary | ICD-10-CM | POA: Diagnosis not present

## 2015-05-31 ENCOUNTER — Encounter (HOSPITAL_COMMUNITY): Payer: Self-pay | Admitting: Orthopedic Surgery

## 2015-06-04 DIAGNOSIS — M720 Palmar fascial fibromatosis [Dupuytren]: Secondary | ICD-10-CM | POA: Diagnosis not present

## 2015-06-13 DIAGNOSIS — M720 Palmar fascial fibromatosis [Dupuytren]: Secondary | ICD-10-CM | POA: Diagnosis not present

## 2015-06-22 DIAGNOSIS — M720 Palmar fascial fibromatosis [Dupuytren]: Secondary | ICD-10-CM | POA: Diagnosis not present

## 2015-07-02 DIAGNOSIS — M24542 Contracture, left hand: Secondary | ICD-10-CM | POA: Diagnosis not present

## 2015-07-09 DIAGNOSIS — M24542 Contracture, left hand: Secondary | ICD-10-CM | POA: Diagnosis not present

## 2015-07-16 DIAGNOSIS — M24542 Contracture, left hand: Secondary | ICD-10-CM | POA: Diagnosis not present

## 2015-07-23 DIAGNOSIS — M24542 Contracture, left hand: Secondary | ICD-10-CM | POA: Diagnosis not present

## 2015-08-24 DIAGNOSIS — C61 Malignant neoplasm of prostate: Secondary | ICD-10-CM | POA: Diagnosis not present

## 2015-09-06 DIAGNOSIS — H5203 Hypermetropia, bilateral: Secondary | ICD-10-CM | POA: Diagnosis not present

## 2015-09-06 DIAGNOSIS — H5210 Myopia, unspecified eye: Secondary | ICD-10-CM | POA: Diagnosis not present

## 2015-09-20 DIAGNOSIS — H401132 Primary open-angle glaucoma, bilateral, moderate stage: Secondary | ICD-10-CM | POA: Diagnosis not present

## 2015-09-27 DIAGNOSIS — H401132 Primary open-angle glaucoma, bilateral, moderate stage: Secondary | ICD-10-CM | POA: Diagnosis not present

## 2015-10-04 DIAGNOSIS — H401132 Primary open-angle glaucoma, bilateral, moderate stage: Secondary | ICD-10-CM | POA: Diagnosis not present

## 2015-10-08 DIAGNOSIS — C61 Malignant neoplasm of prostate: Secondary | ICD-10-CM | POA: Diagnosis not present

## 2015-10-16 DIAGNOSIS — I10 Essential (primary) hypertension: Secondary | ICD-10-CM | POA: Diagnosis not present

## 2015-10-16 DIAGNOSIS — E784 Other hyperlipidemia: Secondary | ICD-10-CM | POA: Diagnosis not present

## 2015-10-16 DIAGNOSIS — Z125 Encounter for screening for malignant neoplasm of prostate: Secondary | ICD-10-CM | POA: Diagnosis not present

## 2015-10-16 DIAGNOSIS — I2510 Atherosclerotic heart disease of native coronary artery without angina pectoris: Secondary | ICD-10-CM | POA: Diagnosis not present

## 2015-10-16 DIAGNOSIS — R7301 Impaired fasting glucose: Secondary | ICD-10-CM | POA: Diagnosis not present

## 2015-10-19 DIAGNOSIS — C61 Malignant neoplasm of prostate: Secondary | ICD-10-CM | POA: Diagnosis not present

## 2015-10-19 DIAGNOSIS — M25562 Pain in left knee: Secondary | ICD-10-CM | POA: Diagnosis not present

## 2015-10-19 DIAGNOSIS — R7301 Impaired fasting glucose: Secondary | ICD-10-CM | POA: Diagnosis not present

## 2015-10-19 DIAGNOSIS — Z6827 Body mass index (BMI) 27.0-27.9, adult: Secondary | ICD-10-CM | POA: Diagnosis not present

## 2015-10-19 DIAGNOSIS — I6529 Occlusion and stenosis of unspecified carotid artery: Secondary | ICD-10-CM | POA: Diagnosis not present

## 2015-10-19 DIAGNOSIS — Z0000 Encounter for general adult medical examination without abnormal findings: Secondary | ICD-10-CM | POA: Diagnosis not present

## 2015-10-19 DIAGNOSIS — D692 Other nonthrombocytopenic purpura: Secondary | ICD-10-CM | POA: Diagnosis not present

## 2015-10-19 DIAGNOSIS — I714 Abdominal aortic aneurysm, without rupture: Secondary | ICD-10-CM | POA: Diagnosis not present

## 2015-10-19 DIAGNOSIS — M79642 Pain in left hand: Secondary | ICD-10-CM | POA: Diagnosis not present

## 2015-10-22 DIAGNOSIS — Z1212 Encounter for screening for malignant neoplasm of rectum: Secondary | ICD-10-CM | POA: Diagnosis not present

## 2015-10-26 DIAGNOSIS — C61 Malignant neoplasm of prostate: Secondary | ICD-10-CM | POA: Diagnosis not present

## 2015-11-08 DIAGNOSIS — G5602 Carpal tunnel syndrome, left upper limb: Secondary | ICD-10-CM | POA: Diagnosis not present

## 2015-11-14 DIAGNOSIS — H401132 Primary open-angle glaucoma, bilateral, moderate stage: Secondary | ICD-10-CM | POA: Diagnosis not present

## 2015-11-17 DIAGNOSIS — G5602 Carpal tunnel syndrome, left upper limb: Secondary | ICD-10-CM | POA: Diagnosis not present

## 2015-11-29 DIAGNOSIS — G5602 Carpal tunnel syndrome, left upper limb: Secondary | ICD-10-CM | POA: Diagnosis not present

## 2015-11-30 DIAGNOSIS — C61 Malignant neoplasm of prostate: Secondary | ICD-10-CM | POA: Diagnosis not present

## 2015-12-07 DIAGNOSIS — R9721 Rising PSA following treatment for malignant neoplasm of prostate: Secondary | ICD-10-CM | POA: Diagnosis not present

## 2015-12-07 DIAGNOSIS — C61 Malignant neoplasm of prostate: Secondary | ICD-10-CM | POA: Diagnosis not present

## 2015-12-07 DIAGNOSIS — R935 Abnormal findings on diagnostic imaging of other abdominal regions, including retroperitoneum: Secondary | ICD-10-CM | POA: Diagnosis not present

## 2015-12-07 DIAGNOSIS — I723 Aneurysm of iliac artery: Secondary | ICD-10-CM | POA: Diagnosis not present

## 2015-12-14 ENCOUNTER — Emergency Department (HOSPITAL_COMMUNITY): Payer: Commercial Managed Care - HMO

## 2015-12-14 ENCOUNTER — Emergency Department (HOSPITAL_COMMUNITY)
Admission: EM | Admit: 2015-12-14 | Discharge: 2015-12-14 | Disposition: A | Discharge status: Home / Self Care | Payer: Commercial Managed Care - HMO | Attending: Emergency Medicine | Admitting: Emergency Medicine

## 2015-12-14 ENCOUNTER — Encounter (HOSPITAL_COMMUNITY): Payer: Self-pay | Admitting: Emergency Medicine

## 2015-12-14 DIAGNOSIS — Z7982 Long term (current) use of aspirin: Secondary | ICD-10-CM | POA: Diagnosis not present

## 2015-12-14 DIAGNOSIS — E785 Hyperlipidemia, unspecified: Secondary | ICD-10-CM | POA: Diagnosis not present

## 2015-12-14 DIAGNOSIS — Z7901 Long term (current) use of anticoagulants: Secondary | ICD-10-CM | POA: Insufficient documentation

## 2015-12-14 DIAGNOSIS — K219 Gastro-esophageal reflux disease without esophagitis: Secondary | ICD-10-CM | POA: Insufficient documentation

## 2015-12-14 DIAGNOSIS — R079 Chest pain, unspecified: Secondary | ICD-10-CM

## 2015-12-14 DIAGNOSIS — R0789 Other chest pain: Secondary | ICD-10-CM | POA: Diagnosis not present

## 2015-12-14 DIAGNOSIS — Z8546 Personal history of malignant neoplasm of prostate: Secondary | ICD-10-CM | POA: Diagnosis not present

## 2015-12-14 DIAGNOSIS — Z79891 Long term (current) use of opiate analgesic: Secondary | ICD-10-CM | POA: Diagnosis not present

## 2015-12-14 DIAGNOSIS — M1990 Unspecified osteoarthritis, unspecified site: Secondary | ICD-10-CM | POA: Diagnosis not present

## 2015-12-14 DIAGNOSIS — Z9889 Other specified postprocedural states: Secondary | ICD-10-CM | POA: Insufficient documentation

## 2015-12-14 DIAGNOSIS — Z87891 Personal history of nicotine dependence: Secondary | ICD-10-CM | POA: Insufficient documentation

## 2015-12-14 DIAGNOSIS — I10 Essential (primary) hypertension: Secondary | ICD-10-CM | POA: Diagnosis not present

## 2015-12-14 DIAGNOSIS — Z79899 Other long term (current) drug therapy: Secondary | ICD-10-CM | POA: Diagnosis not present

## 2015-12-14 LAB — DIFFERENTIAL
BASOS ABS: 0 10*3/uL (ref 0.0–0.1)
Basophils Relative: 0 %
Eosinophils Absolute: 0.2 10*3/uL (ref 0.0–0.7)
Eosinophils Relative: 4 %
LYMPHS ABS: 2 10*3/uL (ref 0.7–4.0)
LYMPHS PCT: 49 %
MONOS PCT: 7 %
Monocytes Absolute: 0.3 10*3/uL (ref 0.1–1.0)
NEUTROS ABS: 1.6 10*3/uL — AB (ref 1.7–7.7)
Neutrophils Relative %: 40 %

## 2015-12-14 LAB — BASIC METABOLIC PANEL
Anion gap: 8 (ref 5–15)
BUN: 11 mg/dL (ref 6–20)
CALCIUM: 9.7 mg/dL (ref 8.9–10.3)
CO2: 25 mmol/L (ref 22–32)
CREATININE: 1.05 mg/dL (ref 0.61–1.24)
Chloride: 109 mmol/L (ref 101–111)
Glucose, Bld: 99 mg/dL (ref 65–99)
Potassium: 3.8 mmol/L (ref 3.5–5.1)
SODIUM: 142 mmol/L (ref 135–145)

## 2015-12-14 LAB — CBC
HCT: 40.8 % (ref 39.0–52.0)
Hemoglobin: 13.9 g/dL (ref 13.0–17.0)
MCH: 30.2 pg (ref 26.0–34.0)
MCHC: 34.1 g/dL (ref 30.0–36.0)
MCV: 88.5 fL (ref 78.0–100.0)
PLATELETS: 178 10*3/uL (ref 150–400)
RBC: 4.61 MIL/uL (ref 4.22–5.81)
RDW: 13.4 % (ref 11.5–15.5)
WBC: 3.8 10*3/uL — AB (ref 4.0–10.5)

## 2015-12-14 LAB — I-STAT TROPONIN, ED
Troponin i, poc: 0 ng/mL (ref 0.00–0.08)
Troponin i, poc: 0 ng/mL (ref 0.00–0.08)

## 2015-12-14 NOTE — ED Provider Notes (Signed)
CSN: [id number]     Arrival date & time 12/14/15  1209 History   First MD Initiated Contact with Patient 12/14/15 1657     Chief Complaint  Patient presents with  . Chest Pain     (Consider location/radiation/quality/duration/timing/severity/associated sxs/prior Treatment) HPI  75 year old male who presents with chest pain. History of hypertension, hyperlipidemia. States that he otherwise has been in his usual state of health. Over the past 3 days has developed intermittent chest pain, 2-3 episodes. Describes pain as pressure like, that radiates towards the back. He has never had similar chest pain in the past. Denies any associating diaphoresis, nausea or vomiting, lightheadedness or syncope, or difficulty breathing. No associating lower extremity edema or pain, orthopnea or PND. States that is not associated with exertion or worsened by activity. Typically pain occurs at rest lasting only 2-3 minutes before self resolution. States that he has worked in [employer] last week, and has been walking his dogs for 30 minutes daily, and has not had similar symptoms or symptoms of fatigue or shortness of breath. No recent infections including cough, congestion, fevers or chills. Pain also not associated with eating, and denies any associating abdominal pain or other GI symptoms. Currently asymptomatic.  Past Medical History  Diagnosis Date  . GERD (gastroesophageal reflux disease)   . Headache(784.0)   . Cancer [facility])     prostate  . Arthritis   . Hypertension   . Hyperlipidemia    Past Surgical History  Procedure Laterality Date  . Prostatectomy  2001  . Fracture surgery Right     ankle  . Cardiac catheterization      hx 1980's can't remember M D   . Joint replacement Left 02/06/11  . Bladder tumor excision  2009  . Urethal sling  2009  . Colonoscopy  2005, 2012  . Arthroscopy knee w/ drilling Left 05/01/10  . I&d knee with poly exchange Left 03/14/2013    Procedure: total knee revision;   Surgeon: Vickey Huger, MD;  Location: [place];  Service: Orthopedics;  Laterality: Left;   History reviewed. No pertinent family history. Social History  Substance Use Topics  . Smoking status: Former Research scientist (life sciences)  . Smokeless tobacco: None  . Alcohol Use: No     Comment: social    Review of Systems 10/14 systems reviewed and are negative other than those stated in the HPI    Allergies  Review of patient's allergies indicates no known allergies.  Home Medications   Prior to Admission medications   Medication Sig Start Date End Date Taking? Authorizing Provider  aspirin EC 81 MG tablet Take 81 mg by mouth daily.   Yes Historical Provider, MD  lisinopril (PRINIVIL,ZESTRIL) 20 MG tablet Take 20 mg by mouth daily with breakfast.   Yes Historical Provider, MD  omeprazole (PRILOSEC) 40 MG capsule Take 40 mg by mouth daily with breakfast.   Yes Historical Provider, MD  simvastatin (ZOCOR) 40 MG tablet Take 40 mg by mouth daily with breakfast.   Yes Historical Provider, MD  Aspirin (ECOTRIN PO) Take 81 mg by mouth daily.     Historical Provider, MD  celecoxib (CELEBREX) 200 MG capsule Take 1 capsule (200 mg total) by mouth every 12 (twelve) hours. 03/15/13   Carlynn Spry, PA-C  enoxaparin (LOVENOX) 40 MG/0.4ML injection Inject 0.4 mLs (40 mg total) into the skin daily. 03/15/13   Carlynn Spry, PA-C  enoxaparin (LOVENOX) 40 MG/0.4ML injection Inject 0.4 mLs (40 mg total) into the skin  daily. 03/15/13   Carlynn Spry, PA-C  methocarbamol (ROBAXIN) 500 MG tablet Take 1 tablet (500 mg total) by mouth every 6 (six) hours as needed. 03/15/13   Carlynn Spry, PA-C  oxyCODONE (OXY IR/ROXICODONE) 5 MG immediate release tablet Take 1-2 tablets (5-10 mg total) by mouth every 4 (four) hours as needed. 03/15/13   Carlynn Spry, PA-C  OxyCODONE (OXYCONTIN) 10 mg T12A Take 1 tablet (10 mg total) by mouth every 12 (twelve) hours. 03/15/13   Carlynn Spry, PA-C  simvastatin (ZOCOR) 40 MG tablet Take 40 mg by mouth  daily.    Historical Provider, MD   BP 162/77 mmHg  Pulse 60  Temp(Src) 97.5 F (36.4 C) (Oral)  Resp 16  Ht 5\' 9"  (1.753 m)  Wt 185 lb (83.915 kg)  BMI 27.31 kg/m2  SpO2 100% Physical Exam Physical Exam  Nursing note and vitals reviewed. Constitutional: Well developed, well nourished, non-toxic, and in no acute distress Head: Normocephalic and atraumatic.  Mouth/Throat: Oropharynx is clear and moist.  Neck: Normal range of motion. Neck supple.  Cardiovascular: Normal rate and regular rhythm.   Pulmonary/Chest: Effort normal and breath sounds normal.  Abdominal: Soft. There is no tenderness. There is no rebound and no guarding.  Musculoskeletal: Normal range of motion.  Neurological: Alert, no facial droop, fluent speech, moves all extremities symmetrically Skin: Skin is warm and dry.  Psychiatric: Cooperative  ED Course  Procedures (including critical care time) Labs Review Labs Reviewed  CBC - Abnormal; Notable for the following:    WBC 3.8 (*)    All other components within normal limits  DIFFERENTIAL - Abnormal; Notable for the following:    Neutro Abs 1.6 (*)    All other components within normal limits  BASIC METABOLIC PANEL  I-STAT TROPOININ, ED  Randolm Idol, ED    Imaging Review Dg Chest 2 View  12/14/2015  CLINICAL DATA:  75 year old male with mid chest pain for the past 2 days. EXAM: CHEST  2 VIEW COMPARISON:  Chest x-ray 03/07/2013. FINDINGS: Lung volumes are normal. No consolidative airspace disease. No pleural effusions. No pneumothorax. No pulmonary nodule or mass noted. Pulmonary vasculature and the cardiomediastinal silhouette are within normal limits. Atherosclerosis in the thoracic aorta. IMPRESSION: 1.  No radiographic evidence of acute cardiopulmonary disease. 2. Atherosclerosis. Electronically Signed   By: Vinnie Langton M.D.   On: 12/14/2015 12:43   I have personally reviewed and evaluated these images and lab results as part of my medical  decision-making.   EKG Interpretation   Date/Time:  Friday December 14 2015 12:20:06 EST Ventricular Rate:  83 PR Interval:  136 QRS Duration: 82 QT Interval:  392 QTC Calculation: 460 R Axis:   -20 Text Interpretation:  Normal sinus rhythm Minimal voltage criteria for  LVH, may be normal variant Borderline ECG No significant change since last  tracing Confirmed by Aldine Chakraborty MD, Hinton Dyer [id number]) on 12/14/2015 5:02:52 PM      MDM   Final diagnoses:  Chest pain, unspecified chest pain type    75 year old male with history of hypertension and hyperlipidemia with prostate cancer currently under radiation therapy who presents to emergency department with intermittent chest pressure over the past 2-3 days. I is asymptomatic on arrival, with stable vital signs. EKG with no acute ischemic changes and no evidence of heart strain. Serial troponins negative and chest x-ray with no acute cardiopulmonary processes. It is slightly high risk for ACS although history is not truly supportive of that. I has a heart  score of 3-4. Presentation atypical for that a PE or dissection, and I have low suspicion for these etiologies. I discussed observation in the hospital for serial cardiac markers and potential stress testing. He expressed a preference to go home, and he expresses understanding that he is at higher risk category and that major risk for adverse cardiac events including death and severe disability. States that he will return for any recurrent or worsening symptoms. He will follow up closely with his primary care doctor. Strict return and follow-up instructions reviewed. He expressed understanding of all discharge instructions and felt comfortable with the plan of care.   Forde Dandy, MD 12/14/15 [phone]

## 2015-12-14 NOTE — ED Notes (Signed)
Onset 2 days ago chest pain and shortness of breath intermittent continued today and currently denies pain.

## 2015-12-14 NOTE — Discharge Instructions (Signed)
Return without fail for worsening symptoms, including worsening pain, passing out, difficulty breathing, or any other symptoms concerning to you. Please see your primary care doctor first thing Monday for re-evaluation otherwise and discuss potential stress test.  Nonspecific Chest Pain It is often hard to find the cause of chest pain. There is always a chance that your pain could be related to something serious, such as a heart attack or a blood clot in your lungs. Chest pain can also be caused by conditions that are not life-threatening. If you have chest pain, it is very important to follow up with your doctor.  HOME CARE  If you were prescribed an antibiotic medicine, finish it all even if you start to feel better.  Avoid any activities that cause chest pain.  Do not use any tobacco products, including cigarettes, chewing tobacco, or electronic cigarettes. If you need help quitting, ask your doctor.  Do not drink alcohol.  Take medicines only as told by your doctor.  Keep all follow-up visits as told by your doctor. This is important. This includes any further testing if your chest pain does not go away.  Your doctor may tell you to keep your head raised (elevated) while you sleep.  Make lifestyle changes as told by your doctor. These may include:  Getting regular exercise. Ask your doctor to suggest some activities that are safe for you.  Eating a heart-healthy diet. Your doctor or a diet specialist (dietitian) can help you to learn healthy eating options.  Maintaining a healthy weight.  Managing diabetes, if necessary.  Reducing stress. GET HELP IF:  Your chest pain does not go away, even after treatment.  You have a rash with blisters on your chest.  You have a fever. GET HELP RIGHT AWAY IF:  Your chest pain is worse.  You have an increasing cough, or you cough up blood.  You have severe belly (abdominal) pain.  You feel extremely weak.  You pass out  (faint).  You have chills.  You have sudden, unexplained chest discomfort.  You have sudden, unexplained discomfort in your arms, back, neck, or jaw.  You have shortness of breath at any time.  You suddenly start to sweat, or your skin gets clammy.  You feel nauseous.  You vomit.  You suddenly feel light-headed or dizzy.  Your heart begins to beat quickly, or it feels like it is skipping beats. These symptoms may be an emergency. Do not wait to see if the symptoms will go away. Get medical help right away. Call your local emergency services (911 in the [place] Do not drive yourself to the hospital.   This information is not intended to replace advice given to you by your health care provider. Make sure you discuss any questions you have with your health care provider.   Document Released: 04/28/2008 Document Revised: 12/01/2014 Document Reviewed: 06/16/2014 Elsevier Interactive Patient Education [employer].

## 2015-12-17 DIAGNOSIS — Z510 Encounter for antineoplastic radiation therapy: Secondary | ICD-10-CM | POA: Diagnosis not present

## 2015-12-17 DIAGNOSIS — C61 Malignant neoplasm of prostate: Secondary | ICD-10-CM | POA: Diagnosis not present

## 2015-12-19 DIAGNOSIS — E7800 Pure hypercholesterolemia, unspecified: Secondary | ICD-10-CM | POA: Diagnosis not present

## 2015-12-19 DIAGNOSIS — Z1389 Encounter for screening for other disorder: Secondary | ICD-10-CM | POA: Diagnosis not present

## 2015-12-19 DIAGNOSIS — R079 Chest pain, unspecified: Secondary | ICD-10-CM | POA: Diagnosis not present

## 2015-12-19 DIAGNOSIS — I10 Essential (primary) hypertension: Secondary | ICD-10-CM | POA: Diagnosis not present

## 2015-12-20 DIAGNOSIS — C61 Malignant neoplasm of prostate: Secondary | ICD-10-CM | POA: Diagnosis not present

## 2015-12-20 DIAGNOSIS — Z510 Encounter for antineoplastic radiation therapy: Secondary | ICD-10-CM | POA: Diagnosis not present

## 2015-12-21 DIAGNOSIS — C61 Malignant neoplasm of prostate: Secondary | ICD-10-CM | POA: Diagnosis not present

## 2015-12-21 DIAGNOSIS — Z510 Encounter for antineoplastic radiation therapy: Secondary | ICD-10-CM | POA: Diagnosis not present

## 2015-12-24 DIAGNOSIS — Z510 Encounter for antineoplastic radiation therapy: Secondary | ICD-10-CM | POA: Diagnosis not present

## 2015-12-24 DIAGNOSIS — C61 Malignant neoplasm of prostate: Secondary | ICD-10-CM | POA: Diagnosis not present

## 2015-12-25 DIAGNOSIS — C61 Malignant neoplasm of prostate: Secondary | ICD-10-CM | POA: Diagnosis not present

## 2015-12-25 DIAGNOSIS — Z510 Encounter for antineoplastic radiation therapy: Secondary | ICD-10-CM | POA: Diagnosis not present

## 2015-12-26 DIAGNOSIS — R350 Frequency of micturition: Secondary | ICD-10-CM | POA: Diagnosis not present

## 2015-12-26 DIAGNOSIS — H401132 Primary open-angle glaucoma, bilateral, moderate stage: Secondary | ICD-10-CM | POA: Diagnosis not present

## 2015-12-26 DIAGNOSIS — C61 Malignant neoplasm of prostate: Secondary | ICD-10-CM | POA: Diagnosis not present

## 2015-12-26 DIAGNOSIS — Z510 Encounter for antineoplastic radiation therapy: Secondary | ICD-10-CM | POA: Diagnosis not present

## 2015-12-26 DIAGNOSIS — R3915 Urgency of urination: Secondary | ICD-10-CM | POA: Diagnosis not present

## 2015-12-27 DIAGNOSIS — R0789 Other chest pain: Secondary | ICD-10-CM | POA: Diagnosis not present

## 2015-12-27 DIAGNOSIS — R350 Frequency of micturition: Secondary | ICD-10-CM | POA: Diagnosis not present

## 2015-12-27 DIAGNOSIS — I700 Atherosclerosis of aorta: Secondary | ICD-10-CM | POA: Diagnosis not present

## 2015-12-27 DIAGNOSIS — I10 Essential (primary) hypertension: Secondary | ICD-10-CM | POA: Diagnosis not present

## 2015-12-27 DIAGNOSIS — R3915 Urgency of urination: Secondary | ICD-10-CM | POA: Diagnosis not present

## 2015-12-27 DIAGNOSIS — Z510 Encounter for antineoplastic radiation therapy: Secondary | ICD-10-CM | POA: Diagnosis not present

## 2015-12-27 DIAGNOSIS — C61 Malignant neoplasm of prostate: Secondary | ICD-10-CM | POA: Diagnosis not present

## 2015-12-27 DIAGNOSIS — R9431 Abnormal electrocardiogram [ECG] [EKG]: Secondary | ICD-10-CM | POA: Diagnosis not present

## 2015-12-28 DIAGNOSIS — C61 Malignant neoplasm of prostate: Secondary | ICD-10-CM | POA: Diagnosis not present

## 2015-12-28 DIAGNOSIS — Z510 Encounter for antineoplastic radiation therapy: Secondary | ICD-10-CM | POA: Diagnosis not present

## 2015-12-28 DIAGNOSIS — R350 Frequency of micturition: Secondary | ICD-10-CM | POA: Diagnosis not present

## 2015-12-28 DIAGNOSIS — R3915 Urgency of urination: Secondary | ICD-10-CM | POA: Diagnosis not present

## 2015-12-31 DIAGNOSIS — R3915 Urgency of urination: Secondary | ICD-10-CM | POA: Diagnosis not present

## 2015-12-31 DIAGNOSIS — Z510 Encounter for antineoplastic radiation therapy: Secondary | ICD-10-CM | POA: Diagnosis not present

## 2015-12-31 DIAGNOSIS — R350 Frequency of micturition: Secondary | ICD-10-CM | POA: Diagnosis not present

## 2015-12-31 DIAGNOSIS — C61 Malignant neoplasm of prostate: Secondary | ICD-10-CM | POA: Diagnosis not present

## 2016-01-01 DIAGNOSIS — Z510 Encounter for antineoplastic radiation therapy: Secondary | ICD-10-CM | POA: Diagnosis not present

## 2016-01-01 DIAGNOSIS — R350 Frequency of micturition: Secondary | ICD-10-CM | POA: Diagnosis not present

## 2016-01-01 DIAGNOSIS — C61 Malignant neoplasm of prostate: Secondary | ICD-10-CM | POA: Diagnosis not present

## 2016-01-01 DIAGNOSIS — R3915 Urgency of urination: Secondary | ICD-10-CM | POA: Diagnosis not present

## 2016-01-02 DIAGNOSIS — R350 Frequency of micturition: Secondary | ICD-10-CM | POA: Diagnosis not present

## 2016-01-02 DIAGNOSIS — R3915 Urgency of urination: Secondary | ICD-10-CM | POA: Diagnosis not present

## 2016-01-02 DIAGNOSIS — Z510 Encounter for antineoplastic radiation therapy: Secondary | ICD-10-CM | POA: Diagnosis not present

## 2016-01-02 DIAGNOSIS — C61 Malignant neoplasm of prostate: Secondary | ICD-10-CM | POA: Diagnosis not present

## 2016-01-03 DIAGNOSIS — R350 Frequency of micturition: Secondary | ICD-10-CM | POA: Diagnosis not present

## 2016-01-03 DIAGNOSIS — Z510 Encounter for antineoplastic radiation therapy: Secondary | ICD-10-CM | POA: Diagnosis not present

## 2016-01-03 DIAGNOSIS — R3915 Urgency of urination: Secondary | ICD-10-CM | POA: Diagnosis not present

## 2016-01-03 DIAGNOSIS — C61 Malignant neoplasm of prostate: Secondary | ICD-10-CM | POA: Diagnosis not present

## 2016-01-04 DIAGNOSIS — R350 Frequency of micturition: Secondary | ICD-10-CM | POA: Diagnosis not present

## 2016-01-04 DIAGNOSIS — Z510 Encounter for antineoplastic radiation therapy: Secondary | ICD-10-CM | POA: Diagnosis not present

## 2016-01-04 DIAGNOSIS — R3915 Urgency of urination: Secondary | ICD-10-CM | POA: Diagnosis not present

## 2016-01-04 DIAGNOSIS — C61 Malignant neoplasm of prostate: Secondary | ICD-10-CM | POA: Diagnosis not present

## 2016-01-07 DIAGNOSIS — R3915 Urgency of urination: Secondary | ICD-10-CM | POA: Diagnosis not present

## 2016-01-07 DIAGNOSIS — R0789 Other chest pain: Secondary | ICD-10-CM | POA: Diagnosis not present

## 2016-01-07 DIAGNOSIS — C61 Malignant neoplasm of prostate: Secondary | ICD-10-CM | POA: Diagnosis not present

## 2016-01-07 DIAGNOSIS — Z510 Encounter for antineoplastic radiation therapy: Secondary | ICD-10-CM | POA: Diagnosis not present

## 2016-01-07 DIAGNOSIS — R9431 Abnormal electrocardiogram [ECG] [EKG]: Secondary | ICD-10-CM | POA: Diagnosis not present

## 2016-01-07 DIAGNOSIS — R350 Frequency of micturition: Secondary | ICD-10-CM | POA: Diagnosis not present

## 2016-01-08 DIAGNOSIS — C61 Malignant neoplasm of prostate: Secondary | ICD-10-CM | POA: Diagnosis not present

## 2016-01-08 DIAGNOSIS — Z510 Encounter for antineoplastic radiation therapy: Secondary | ICD-10-CM | POA: Diagnosis not present

## 2016-01-08 DIAGNOSIS — R3915 Urgency of urination: Secondary | ICD-10-CM | POA: Diagnosis not present

## 2016-01-08 DIAGNOSIS — R350 Frequency of micturition: Secondary | ICD-10-CM | POA: Diagnosis not present

## 2016-01-09 DIAGNOSIS — R3915 Urgency of urination: Secondary | ICD-10-CM | POA: Diagnosis not present

## 2016-01-09 DIAGNOSIS — R350 Frequency of micturition: Secondary | ICD-10-CM | POA: Diagnosis not present

## 2016-01-09 DIAGNOSIS — C61 Malignant neoplasm of prostate: Secondary | ICD-10-CM | POA: Diagnosis not present

## 2016-01-09 DIAGNOSIS — Z510 Encounter for antineoplastic radiation therapy: Secondary | ICD-10-CM | POA: Diagnosis not present

## 2016-01-10 DIAGNOSIS — Z510 Encounter for antineoplastic radiation therapy: Secondary | ICD-10-CM | POA: Diagnosis not present

## 2016-01-10 DIAGNOSIS — C61 Malignant neoplasm of prostate: Secondary | ICD-10-CM | POA: Diagnosis not present

## 2016-01-10 DIAGNOSIS — R350 Frequency of micturition: Secondary | ICD-10-CM | POA: Diagnosis not present

## 2016-01-10 DIAGNOSIS — R9431 Abnormal electrocardiogram [ECG] [EKG]: Secondary | ICD-10-CM | POA: Diagnosis not present

## 2016-01-10 DIAGNOSIS — R3915 Urgency of urination: Secondary | ICD-10-CM | POA: Diagnosis not present

## 2016-01-11 DIAGNOSIS — Z510 Encounter for antineoplastic radiation therapy: Secondary | ICD-10-CM | POA: Diagnosis not present

## 2016-01-11 DIAGNOSIS — R3915 Urgency of urination: Secondary | ICD-10-CM | POA: Diagnosis not present

## 2016-01-11 DIAGNOSIS — C61 Malignant neoplasm of prostate: Secondary | ICD-10-CM | POA: Diagnosis not present

## 2016-01-11 DIAGNOSIS — R350 Frequency of micturition: Secondary | ICD-10-CM | POA: Diagnosis not present

## 2016-01-14 DIAGNOSIS — R3915 Urgency of urination: Secondary | ICD-10-CM | POA: Diagnosis not present

## 2016-01-14 DIAGNOSIS — R350 Frequency of micturition: Secondary | ICD-10-CM | POA: Diagnosis not present

## 2016-01-14 DIAGNOSIS — Z510 Encounter for antineoplastic radiation therapy: Secondary | ICD-10-CM | POA: Diagnosis not present

## 2016-01-14 DIAGNOSIS — C61 Malignant neoplasm of prostate: Secondary | ICD-10-CM | POA: Diagnosis not present

## 2016-01-15 DIAGNOSIS — R350 Frequency of micturition: Secondary | ICD-10-CM | POA: Diagnosis not present

## 2016-01-15 DIAGNOSIS — Z510 Encounter for antineoplastic radiation therapy: Secondary | ICD-10-CM | POA: Diagnosis not present

## 2016-01-15 DIAGNOSIS — R3915 Urgency of urination: Secondary | ICD-10-CM | POA: Diagnosis not present

## 2016-01-15 DIAGNOSIS — C61 Malignant neoplasm of prostate: Secondary | ICD-10-CM | POA: Diagnosis not present

## 2016-01-16 DIAGNOSIS — C61 Malignant neoplasm of prostate: Secondary | ICD-10-CM | POA: Diagnosis not present

## 2016-01-16 DIAGNOSIS — R3915 Urgency of urination: Secondary | ICD-10-CM | POA: Diagnosis not present

## 2016-01-16 DIAGNOSIS — R350 Frequency of micturition: Secondary | ICD-10-CM | POA: Diagnosis not present

## 2016-01-16 DIAGNOSIS — Z510 Encounter for antineoplastic radiation therapy: Secondary | ICD-10-CM | POA: Diagnosis not present

## 2016-01-17 DIAGNOSIS — I700 Atherosclerosis of aorta: Secondary | ICD-10-CM | POA: Diagnosis not present

## 2016-01-17 DIAGNOSIS — R0789 Other chest pain: Secondary | ICD-10-CM | POA: Diagnosis not present

## 2016-01-17 DIAGNOSIS — R9431 Abnormal electrocardiogram [ECG] [EKG]: Secondary | ICD-10-CM | POA: Diagnosis not present

## 2016-01-17 DIAGNOSIS — Z510 Encounter for antineoplastic radiation therapy: Secondary | ICD-10-CM | POA: Diagnosis not present

## 2016-01-17 DIAGNOSIS — R3915 Urgency of urination: Secondary | ICD-10-CM | POA: Diagnosis not present

## 2016-01-17 DIAGNOSIS — I10 Essential (primary) hypertension: Secondary | ICD-10-CM | POA: Diagnosis not present

## 2016-01-17 DIAGNOSIS — R350 Frequency of micturition: Secondary | ICD-10-CM | POA: Diagnosis not present

## 2016-01-17 DIAGNOSIS — C61 Malignant neoplasm of prostate: Secondary | ICD-10-CM | POA: Diagnosis not present

## 2016-01-18 DIAGNOSIS — R3915 Urgency of urination: Secondary | ICD-10-CM | POA: Diagnosis not present

## 2016-01-18 DIAGNOSIS — Z510 Encounter for antineoplastic radiation therapy: Secondary | ICD-10-CM | POA: Diagnosis not present

## 2016-01-18 DIAGNOSIS — R9431 Abnormal electrocardiogram [ECG] [EKG]: Secondary | ICD-10-CM | POA: Diagnosis not present

## 2016-01-18 DIAGNOSIS — R350 Frequency of micturition: Secondary | ICD-10-CM | POA: Diagnosis not present

## 2016-01-18 DIAGNOSIS — R0789 Other chest pain: Secondary | ICD-10-CM | POA: Diagnosis not present

## 2016-01-18 DIAGNOSIS — I10 Essential (primary) hypertension: Secondary | ICD-10-CM | POA: Diagnosis not present

## 2016-01-18 DIAGNOSIS — I700 Atherosclerosis of aorta: Secondary | ICD-10-CM | POA: Diagnosis not present

## 2016-01-18 DIAGNOSIS — C61 Malignant neoplasm of prostate: Secondary | ICD-10-CM | POA: Diagnosis not present

## 2016-01-21 DIAGNOSIS — R350 Frequency of micturition: Secondary | ICD-10-CM | POA: Diagnosis not present

## 2016-01-21 DIAGNOSIS — Z510 Encounter for antineoplastic radiation therapy: Secondary | ICD-10-CM | POA: Diagnosis not present

## 2016-01-21 DIAGNOSIS — C61 Malignant neoplasm of prostate: Secondary | ICD-10-CM | POA: Diagnosis not present

## 2016-01-21 DIAGNOSIS — R3915 Urgency of urination: Secondary | ICD-10-CM | POA: Diagnosis not present

## 2016-01-22 DIAGNOSIS — R3915 Urgency of urination: Secondary | ICD-10-CM | POA: Diagnosis not present

## 2016-01-22 DIAGNOSIS — R350 Frequency of micturition: Secondary | ICD-10-CM | POA: Diagnosis not present

## 2016-01-22 DIAGNOSIS — C61 Malignant neoplasm of prostate: Secondary | ICD-10-CM | POA: Diagnosis not present

## 2016-01-22 DIAGNOSIS — Z510 Encounter for antineoplastic radiation therapy: Secondary | ICD-10-CM | POA: Diagnosis not present

## 2016-01-23 DIAGNOSIS — C61 Malignant neoplasm of prostate: Secondary | ICD-10-CM | POA: Diagnosis not present

## 2016-01-24 DIAGNOSIS — C61 Malignant neoplasm of prostate: Secondary | ICD-10-CM | POA: Diagnosis not present

## 2016-01-25 DIAGNOSIS — C61 Malignant neoplasm of prostate: Secondary | ICD-10-CM | POA: Diagnosis not present

## 2016-01-28 DIAGNOSIS — C61 Malignant neoplasm of prostate: Secondary | ICD-10-CM | POA: Diagnosis not present

## 2016-01-29 DIAGNOSIS — C61 Malignant neoplasm of prostate: Secondary | ICD-10-CM | POA: Diagnosis not present

## 2016-01-30 DIAGNOSIS — C61 Malignant neoplasm of prostate: Secondary | ICD-10-CM | POA: Diagnosis not present

## 2016-01-31 DIAGNOSIS — C61 Malignant neoplasm of prostate: Secondary | ICD-10-CM | POA: Diagnosis not present

## 2016-02-01 DIAGNOSIS — C61 Malignant neoplasm of prostate: Secondary | ICD-10-CM | POA: Diagnosis not present

## 2016-02-04 DIAGNOSIS — C61 Malignant neoplasm of prostate: Secondary | ICD-10-CM | POA: Diagnosis not present

## 2016-02-05 DIAGNOSIS — C61 Malignant neoplasm of prostate: Secondary | ICD-10-CM | POA: Diagnosis not present

## 2016-02-06 DIAGNOSIS — C61 Malignant neoplasm of prostate: Secondary | ICD-10-CM | POA: Diagnosis not present

## 2016-02-07 DIAGNOSIS — C61 Malignant neoplasm of prostate: Secondary | ICD-10-CM | POA: Diagnosis not present

## 2016-02-08 DIAGNOSIS — C61 Malignant neoplasm of prostate: Secondary | ICD-10-CM | POA: Diagnosis not present

## 2016-02-11 DIAGNOSIS — C61 Malignant neoplasm of prostate: Secondary | ICD-10-CM | POA: Diagnosis not present

## 2016-03-14 DIAGNOSIS — C61 Malignant neoplasm of prostate: Secondary | ICD-10-CM | POA: Diagnosis not present

## 2016-04-02 DIAGNOSIS — C61 Malignant neoplasm of prostate: Secondary | ICD-10-CM | POA: Diagnosis not present

## 2016-04-04 DIAGNOSIS — C61 Malignant neoplasm of prostate: Secondary | ICD-10-CM | POA: Diagnosis not present

## 2016-04-15 DIAGNOSIS — R0789 Other chest pain: Secondary | ICD-10-CM | POA: Diagnosis not present

## 2016-04-15 DIAGNOSIS — R9431 Abnormal electrocardiogram [ECG] [EKG]: Secondary | ICD-10-CM | POA: Diagnosis not present

## 2016-04-15 DIAGNOSIS — I10 Essential (primary) hypertension: Secondary | ICD-10-CM | POA: Diagnosis not present

## 2016-04-15 DIAGNOSIS — I700 Atherosclerosis of aorta: Secondary | ICD-10-CM | POA: Diagnosis not present

## 2016-05-02 DIAGNOSIS — I700 Atherosclerosis of aorta: Secondary | ICD-10-CM | POA: Diagnosis not present

## 2016-05-02 DIAGNOSIS — R7301 Impaired fasting glucose: Secondary | ICD-10-CM | POA: Diagnosis not present

## 2016-05-02 DIAGNOSIS — D692 Other nonthrombocytopenic purpura: Secondary | ICD-10-CM | POA: Diagnosis not present

## 2016-05-02 DIAGNOSIS — I714 Abdominal aortic aneurysm, without rupture: Secondary | ICD-10-CM | POA: Diagnosis not present

## 2016-05-02 DIAGNOSIS — C61 Malignant neoplasm of prostate: Secondary | ICD-10-CM | POA: Diagnosis not present

## 2016-05-02 DIAGNOSIS — I6529 Occlusion and stenosis of unspecified carotid artery: Secondary | ICD-10-CM | POA: Diagnosis not present

## 2016-05-02 DIAGNOSIS — M1990 Unspecified osteoarthritis, unspecified site: Secondary | ICD-10-CM | POA: Diagnosis not present

## 2016-05-02 DIAGNOSIS — R32 Unspecified urinary incontinence: Secondary | ICD-10-CM | POA: Diagnosis not present

## 2016-05-02 DIAGNOSIS — R808 Other proteinuria: Secondary | ICD-10-CM | POA: Diagnosis not present

## 2016-07-08 DIAGNOSIS — C61 Malignant neoplasm of prostate: Secondary | ICD-10-CM | POA: Diagnosis not present

## 2016-09-06 DIAGNOSIS — Z23 Encounter for immunization: Secondary | ICD-10-CM | POA: Diagnosis not present

## 2016-09-26 DIAGNOSIS — M1611 Unilateral primary osteoarthritis, right hip: Secondary | ICD-10-CM | POA: Diagnosis not present

## 2016-09-26 DIAGNOSIS — C61 Malignant neoplasm of prostate: Secondary | ICD-10-CM | POA: Diagnosis not present

## 2016-10-07 DIAGNOSIS — C61 Malignant neoplasm of prostate: Secondary | ICD-10-CM | POA: Diagnosis not present

## 2016-10-21 DIAGNOSIS — Z23 Encounter for immunization: Secondary | ICD-10-CM | POA: Diagnosis not present

## 2016-10-21 DIAGNOSIS — E7800 Pure hypercholesterolemia, unspecified: Secondary | ICD-10-CM | POA: Diagnosis not present

## 2016-10-21 DIAGNOSIS — Z125 Encounter for screening for malignant neoplasm of prostate: Secondary | ICD-10-CM | POA: Diagnosis not present

## 2016-10-21 DIAGNOSIS — R7301 Impaired fasting glucose: Secondary | ICD-10-CM | POA: Diagnosis not present

## 2016-10-21 DIAGNOSIS — I10 Essential (primary) hypertension: Secondary | ICD-10-CM | POA: Diagnosis not present

## 2016-10-21 DIAGNOSIS — Z0000 Encounter for general adult medical examination without abnormal findings: Secondary | ICD-10-CM | POA: Diagnosis not present

## 2016-10-24 DIAGNOSIS — I6529 Occlusion and stenosis of unspecified carotid artery: Secondary | ICD-10-CM | POA: Diagnosis not present

## 2016-10-24 DIAGNOSIS — K219 Gastro-esophageal reflux disease without esophagitis: Secondary | ICD-10-CM | POA: Diagnosis not present

## 2016-10-24 DIAGNOSIS — Z0000 Encounter for general adult medical examination without abnormal findings: Secondary | ICD-10-CM | POA: Diagnosis not present

## 2016-10-24 DIAGNOSIS — I700 Atherosclerosis of aorta: Secondary | ICD-10-CM | POA: Diagnosis not present

## 2016-10-24 DIAGNOSIS — C61 Malignant neoplasm of prostate: Secondary | ICD-10-CM | POA: Diagnosis not present

## 2016-10-24 DIAGNOSIS — M545 Low back pain: Secondary | ICD-10-CM | POA: Diagnosis not present

## 2016-10-24 DIAGNOSIS — R808 Other proteinuria: Secondary | ICD-10-CM | POA: Diagnosis not present

## 2016-10-24 DIAGNOSIS — I714 Abdominal aortic aneurysm, without rupture: Secondary | ICD-10-CM | POA: Diagnosis not present

## 2016-10-24 DIAGNOSIS — D692 Other nonthrombocytopenic purpura: Secondary | ICD-10-CM | POA: Diagnosis not present

## 2016-11-06 DIAGNOSIS — G8929 Other chronic pain: Secondary | ICD-10-CM | POA: Diagnosis not present

## 2016-11-06 DIAGNOSIS — M25561 Pain in right knee: Secondary | ICD-10-CM | POA: Diagnosis not present

## 2016-11-06 DIAGNOSIS — M1711 Unilateral primary osteoarthritis, right knee: Secondary | ICD-10-CM | POA: Diagnosis not present

## 2016-11-14 DIAGNOSIS — H401132 Primary open-angle glaucoma, bilateral, moderate stage: Secondary | ICD-10-CM | POA: Diagnosis not present

## 2016-11-14 DIAGNOSIS — H25813 Combined forms of age-related cataract, bilateral: Secondary | ICD-10-CM | POA: Diagnosis not present

## 2016-11-21 DIAGNOSIS — R3129 Other microscopic hematuria: Secondary | ICD-10-CM | POA: Diagnosis not present

## 2016-11-21 DIAGNOSIS — R823 Hemoglobinuria: Secondary | ICD-10-CM | POA: Diagnosis not present

## 2016-12-05 DIAGNOSIS — N359 Urethral stricture, unspecified: Secondary | ICD-10-CM | POA: Diagnosis not present

## 2016-12-05 DIAGNOSIS — R3129 Other microscopic hematuria: Secondary | ICD-10-CM | POA: Diagnosis not present

## 2016-12-18 DIAGNOSIS — M1711 Unilateral primary osteoarthritis, right knee: Secondary | ICD-10-CM | POA: Diagnosis not present

## 2016-12-26 DIAGNOSIS — C61 Malignant neoplasm of prostate: Secondary | ICD-10-CM | POA: Diagnosis not present

## 2017-01-07 DIAGNOSIS — M1711 Unilateral primary osteoarthritis, right knee: Secondary | ICD-10-CM | POA: Diagnosis not present

## 2017-01-14 DIAGNOSIS — M1711 Unilateral primary osteoarthritis, right knee: Secondary | ICD-10-CM | POA: Diagnosis not present

## 2017-01-21 DIAGNOSIS — M1711 Unilateral primary osteoarthritis, right knee: Secondary | ICD-10-CM | POA: Diagnosis not present

## 2017-01-21 DIAGNOSIS — G8929 Other chronic pain: Secondary | ICD-10-CM | POA: Diagnosis not present

## 2017-01-21 DIAGNOSIS — M25561 Pain in right knee: Secondary | ICD-10-CM | POA: Diagnosis not present

## 2017-02-24 DIAGNOSIS — M1711 Unilateral primary osteoarthritis, right knee: Secondary | ICD-10-CM | POA: Diagnosis not present

## 2017-03-04 DIAGNOSIS — M1711 Unilateral primary osteoarthritis, right knee: Secondary | ICD-10-CM | POA: Diagnosis not present

## 2017-03-06 DIAGNOSIS — C61 Malignant neoplasm of prostate: Secondary | ICD-10-CM | POA: Diagnosis not present

## 2017-04-22 DIAGNOSIS — M6740 Ganglion, unspecified site: Secondary | ICD-10-CM | POA: Diagnosis not present

## 2017-04-22 DIAGNOSIS — G8929 Other chronic pain: Secondary | ICD-10-CM | POA: Diagnosis not present

## 2017-04-22 DIAGNOSIS — C61 Malignant neoplasm of prostate: Secondary | ICD-10-CM | POA: Diagnosis not present

## 2017-04-22 DIAGNOSIS — M1711 Unilateral primary osteoarthritis, right knee: Secondary | ICD-10-CM | POA: Diagnosis not present

## 2017-04-22 DIAGNOSIS — D692 Other nonthrombocytopenic purpura: Secondary | ICD-10-CM | POA: Diagnosis not present

## 2017-04-22 DIAGNOSIS — I6529 Occlusion and stenosis of unspecified carotid artery: Secondary | ICD-10-CM | POA: Diagnosis not present

## 2017-04-22 DIAGNOSIS — M25561 Pain in right knee: Secondary | ICD-10-CM | POA: Diagnosis not present

## 2017-04-22 DIAGNOSIS — I700 Atherosclerosis of aorta: Secondary | ICD-10-CM | POA: Diagnosis not present

## 2017-04-22 DIAGNOSIS — I714 Abdominal aortic aneurysm, without rupture: Secondary | ICD-10-CM | POA: Diagnosis not present

## 2017-04-22 DIAGNOSIS — R808 Other proteinuria: Secondary | ICD-10-CM | POA: Diagnosis not present

## 2017-04-22 DIAGNOSIS — R32 Unspecified urinary incontinence: Secondary | ICD-10-CM | POA: Diagnosis not present

## 2017-04-22 DIAGNOSIS — R7301 Impaired fasting glucose: Secondary | ICD-10-CM | POA: Diagnosis not present

## 2017-04-29 DIAGNOSIS — G8929 Other chronic pain: Secondary | ICD-10-CM | POA: Diagnosis not present

## 2017-04-29 DIAGNOSIS — M25561 Pain in right knee: Secondary | ICD-10-CM | POA: Diagnosis not present

## 2017-05-06 DIAGNOSIS — M1711 Unilateral primary osteoarthritis, right knee: Secondary | ICD-10-CM | POA: Diagnosis not present

## 2017-05-15 DIAGNOSIS — H401132 Primary open-angle glaucoma, bilateral, moderate stage: Secondary | ICD-10-CM | POA: Diagnosis not present

## 2017-05-15 DIAGNOSIS — H25813 Combined forms of age-related cataract, bilateral: Secondary | ICD-10-CM | POA: Diagnosis not present

## 2017-05-15 DIAGNOSIS — H527 Unspecified disorder of refraction: Secondary | ICD-10-CM | POA: Diagnosis not present

## 2017-05-15 DIAGNOSIS — H04123 Dry eye syndrome of bilateral lacrimal glands: Secondary | ICD-10-CM | POA: Diagnosis not present

## 2017-05-18 DIAGNOSIS — Z0100 Encounter for examination of eyes and vision without abnormal findings: Secondary | ICD-10-CM | POA: Diagnosis not present

## 2017-06-05 DIAGNOSIS — C61 Malignant neoplasm of prostate: Secondary | ICD-10-CM | POA: Diagnosis not present

## 2017-06-24 DIAGNOSIS — H401132 Primary open-angle glaucoma, bilateral, moderate stage: Secondary | ICD-10-CM | POA: Diagnosis not present

## 2017-08-07 ENCOUNTER — Encounter: Payer: Self-pay | Admitting: Podiatry

## 2017-08-07 ENCOUNTER — Ambulatory Visit (INDEPENDENT_AMBULATORY_CARE_PROVIDER_SITE_OTHER): Payer: Medicare HMO | Admitting: Podiatry

## 2017-08-07 DIAGNOSIS — L600 Ingrowing nail: Secondary | ICD-10-CM

## 2017-08-07 NOTE — Patient Instructions (Signed)

## 2017-08-07 NOTE — Progress Notes (Signed)
Subjective:    Patient ID: Kevin Pacheco., male   DOB: 76 y.o.   MRN: [id number]   HPI patient states has an ingrown toenail left big toe that's become increasingly sore    ROS      Objective:  Physical Exam neurovascular status intact with incurvated left hallux lateral border that's painful when pressed     Assessment:   Chronic ingrown toenail deformity left hallux      Plan:    Reviewed condition and recommended correction and explained procedure and risk and infiltrated the left hallux 60 Milligan times like Marcaine mixture remove the lateral border exposed matrix applied phenol 3 applications 30 seconds followed by alcohol lavaged sterile dressing. Gave instructions on soaks and reappoint

## 2017-09-04 DIAGNOSIS — N35912 Unspecified bulbous urethral stricture, male: Secondary | ICD-10-CM | POA: Diagnosis not present

## 2017-09-05 DIAGNOSIS — Z23 Encounter for immunization: Secondary | ICD-10-CM | POA: Diagnosis not present

## 2017-09-25 DIAGNOSIS — C61 Malignant neoplasm of prostate: Secondary | ICD-10-CM | POA: Diagnosis not present

## 2017-10-20 DIAGNOSIS — Z125 Encounter for screening for malignant neoplasm of prostate: Secondary | ICD-10-CM | POA: Diagnosis not present

## 2017-10-20 DIAGNOSIS — E7800 Pure hypercholesterolemia, unspecified: Secondary | ICD-10-CM | POA: Diagnosis not present

## 2017-10-20 DIAGNOSIS — I10 Essential (primary) hypertension: Secondary | ICD-10-CM | POA: Diagnosis not present

## 2017-10-20 DIAGNOSIS — R7301 Impaired fasting glucose: Secondary | ICD-10-CM | POA: Diagnosis not present

## 2017-10-20 DIAGNOSIS — R82998 Other abnormal findings in urine: Secondary | ICD-10-CM | POA: Diagnosis not present

## 2017-10-26 DIAGNOSIS — I714 Abdominal aortic aneurysm, without rupture: Secondary | ICD-10-CM | POA: Diagnosis not present

## 2017-10-26 DIAGNOSIS — R808 Other proteinuria: Secondary | ICD-10-CM | POA: Diagnosis not present

## 2017-10-26 DIAGNOSIS — D692 Other nonthrombocytopenic purpura: Secondary | ICD-10-CM | POA: Diagnosis not present

## 2017-10-26 DIAGNOSIS — I700 Atherosclerosis of aorta: Secondary | ICD-10-CM | POA: Diagnosis not present

## 2017-10-26 DIAGNOSIS — E7800 Pure hypercholesterolemia, unspecified: Secondary | ICD-10-CM | POA: Diagnosis not present

## 2017-10-26 DIAGNOSIS — Z0000 Encounter for general adult medical examination without abnormal findings: Secondary | ICD-10-CM | POA: Diagnosis not present

## 2017-10-26 DIAGNOSIS — I6529 Occlusion and stenosis of unspecified carotid artery: Secondary | ICD-10-CM | POA: Diagnosis not present

## 2017-10-26 DIAGNOSIS — C61 Malignant neoplasm of prostate: Secondary | ICD-10-CM | POA: Diagnosis not present

## 2017-10-26 DIAGNOSIS — R7301 Impaired fasting glucose: Secondary | ICD-10-CM | POA: Diagnosis not present

## 2017-10-28 DIAGNOSIS — R1900 Intra-abdominal and pelvic swelling, mass and lump, unspecified site: Secondary | ICD-10-CM | POA: Diagnosis not present

## 2017-10-28 DIAGNOSIS — Z87891 Personal history of nicotine dependence: Secondary | ICD-10-CM | POA: Diagnosis not present

## 2017-10-28 DIAGNOSIS — E7800 Pure hypercholesterolemia, unspecified: Secondary | ICD-10-CM | POA: Diagnosis not present

## 2017-10-28 DIAGNOSIS — I10 Essential (primary) hypertension: Secondary | ICD-10-CM | POA: Diagnosis not present

## 2017-10-29 DIAGNOSIS — Z1212 Encounter for screening for malignant neoplasm of rectum: Secondary | ICD-10-CM | POA: Diagnosis not present

## 2017-11-12 DIAGNOSIS — R1900 Intra-abdominal and pelvic swelling, mass and lump, unspecified site: Secondary | ICD-10-CM | POA: Diagnosis not present

## 2017-11-23 DIAGNOSIS — I10 Essential (primary) hypertension: Secondary | ICD-10-CM | POA: Diagnosis not present

## 2017-11-23 DIAGNOSIS — Z87891 Personal history of nicotine dependence: Secondary | ICD-10-CM | POA: Diagnosis not present

## 2017-11-23 DIAGNOSIS — I714 Abdominal aortic aneurysm, without rupture: Secondary | ICD-10-CM | POA: Diagnosis not present

## 2017-11-23 DIAGNOSIS — E7800 Pure hypercholesterolemia, unspecified: Secondary | ICD-10-CM | POA: Diagnosis not present

## 2017-12-03 DIAGNOSIS — Z6829 Body mass index (BMI) 29.0-29.9, adult: Secondary | ICD-10-CM | POA: Diagnosis not present

## 2017-12-03 DIAGNOSIS — I10 Essential (primary) hypertension: Secondary | ICD-10-CM | POA: Diagnosis not present

## 2017-12-03 DIAGNOSIS — Z129 Encounter for screening for malignant neoplasm, site unspecified: Secondary | ICD-10-CM | POA: Diagnosis not present

## 2017-12-03 DIAGNOSIS — I714 Abdominal aortic aneurysm, without rupture: Secondary | ICD-10-CM | POA: Diagnosis not present

## 2017-12-10 ENCOUNTER — Other Ambulatory Visit: Payer: Self-pay | Admitting: Internal Medicine

## 2017-12-10 DIAGNOSIS — Z122 Encounter for screening for malignant neoplasm of respiratory organs: Secondary | ICD-10-CM

## 2017-12-14 ENCOUNTER — Other Ambulatory Visit: Payer: Self-pay | Admitting: Internal Medicine

## 2017-12-14 DIAGNOSIS — Z87891 Personal history of nicotine dependence: Secondary | ICD-10-CM

## 2017-12-14 DIAGNOSIS — I10 Essential (primary) hypertension: Secondary | ICD-10-CM | POA: Diagnosis not present

## 2017-12-14 DIAGNOSIS — E7800 Pure hypercholesterolemia, unspecified: Secondary | ICD-10-CM | POA: Diagnosis not present

## 2017-12-16 ENCOUNTER — Inpatient Hospital Stay
Admission: RE | Admit: 2017-12-16 | Discharge: 2017-12-16 | Disposition: A | Discharge status: Home / Self Care | Payer: Self-pay | Source: Ambulatory Visit | Attending: Internal Medicine | Admitting: Internal Medicine

## 2017-12-16 ENCOUNTER — Other Ambulatory Visit: Payer: Self-pay

## 2017-12-16 ENCOUNTER — Ambulatory Visit
Admission: RE | Admit: 2017-12-16 | Discharge: 2017-12-16 | Disposition: A | Discharge status: Home / Self Care | Payer: Commercial Managed Care - HMO | Source: Ambulatory Visit | Attending: Internal Medicine | Admitting: Internal Medicine

## 2017-12-16 DIAGNOSIS — Z87891 Personal history of nicotine dependence: Secondary | ICD-10-CM

## 2017-12-16 DIAGNOSIS — R0602 Shortness of breath: Secondary | ICD-10-CM | POA: Diagnosis not present

## 2017-12-21 DIAGNOSIS — E7800 Pure hypercholesterolemia, unspecified: Secondary | ICD-10-CM | POA: Diagnosis not present

## 2017-12-21 DIAGNOSIS — I10 Essential (primary) hypertension: Secondary | ICD-10-CM | POA: Diagnosis not present

## 2017-12-21 DIAGNOSIS — I714 Abdominal aortic aneurysm, without rupture: Secondary | ICD-10-CM | POA: Diagnosis not present

## 2017-12-21 DIAGNOSIS — Z87891 Personal history of nicotine dependence: Secondary | ICD-10-CM | POA: Diagnosis not present

## 2017-12-25 DIAGNOSIS — C61 Malignant neoplasm of prostate: Secondary | ICD-10-CM | POA: Diagnosis not present

## 2018-03-23 DIAGNOSIS — R339 Retention of urine, unspecified: Secondary | ICD-10-CM | POA: Diagnosis not present

## 2018-03-23 DIAGNOSIS — C61 Malignant neoplasm of prostate: Secondary | ICD-10-CM | POA: Diagnosis not present

## 2018-04-05 DIAGNOSIS — C61 Malignant neoplasm of prostate: Secondary | ICD-10-CM | POA: Diagnosis not present

## 2018-05-04 DIAGNOSIS — R600 Localized edema: Secondary | ICD-10-CM | POA: Diagnosis not present

## 2018-05-04 DIAGNOSIS — Z6829 Body mass index (BMI) 29.0-29.9, adult: Secondary | ICD-10-CM | POA: Diagnosis not present

## 2018-05-04 DIAGNOSIS — S93401A Sprain of unspecified ligament of right ankle, initial encounter: Secondary | ICD-10-CM | POA: Diagnosis not present

## 2018-05-12 DIAGNOSIS — S93401D Sprain of unspecified ligament of right ankle, subsequent encounter: Secondary | ICD-10-CM | POA: Diagnosis not present

## 2018-05-12 DIAGNOSIS — C61 Malignant neoplasm of prostate: Secondary | ICD-10-CM | POA: Diagnosis not present

## 2018-05-12 DIAGNOSIS — I714 Abdominal aortic aneurysm, without rupture: Secondary | ICD-10-CM | POA: Diagnosis not present

## 2018-05-12 DIAGNOSIS — I6529 Occlusion and stenosis of unspecified carotid artery: Secondary | ICD-10-CM | POA: Diagnosis not present

## 2018-05-12 DIAGNOSIS — D692 Other nonthrombocytopenic purpura: Secondary | ICD-10-CM | POA: Diagnosis not present

## 2018-05-12 DIAGNOSIS — E663 Overweight: Secondary | ICD-10-CM | POA: Diagnosis not present

## 2018-05-12 DIAGNOSIS — I10 Essential (primary) hypertension: Secondary | ICD-10-CM | POA: Diagnosis not present

## 2018-05-12 DIAGNOSIS — I700 Atherosclerosis of aorta: Secondary | ICD-10-CM | POA: Diagnosis not present

## 2018-05-12 DIAGNOSIS — R7301 Impaired fasting glucose: Secondary | ICD-10-CM | POA: Diagnosis not present

## 2018-05-17 DIAGNOSIS — H25811 Combined forms of age-related cataract, right eye: Secondary | ICD-10-CM | POA: Diagnosis not present

## 2018-05-17 DIAGNOSIS — H25813 Combined forms of age-related cataract, bilateral: Secondary | ICD-10-CM | POA: Diagnosis not present

## 2018-05-17 DIAGNOSIS — Z01818 Encounter for other preprocedural examination: Secondary | ICD-10-CM | POA: Diagnosis not present

## 2018-05-17 DIAGNOSIS — H401132 Primary open-angle glaucoma, bilateral, moderate stage: Secondary | ICD-10-CM | POA: Diagnosis not present

## 2018-05-17 DIAGNOSIS — H25812 Combined forms of age-related cataract, left eye: Secondary | ICD-10-CM | POA: Diagnosis not present

## 2018-06-03 DIAGNOSIS — H401132 Primary open-angle glaucoma, bilateral, moderate stage: Secondary | ICD-10-CM | POA: Diagnosis not present

## 2018-06-03 DIAGNOSIS — H409 Unspecified glaucoma: Secondary | ICD-10-CM | POA: Diagnosis not present

## 2018-06-03 DIAGNOSIS — H401122 Primary open-angle glaucoma, left eye, moderate stage: Secondary | ICD-10-CM | POA: Diagnosis not present

## 2018-06-03 DIAGNOSIS — H25812 Combined forms of age-related cataract, left eye: Secondary | ICD-10-CM | POA: Diagnosis not present

## 2018-06-16 DIAGNOSIS — H401112 Primary open-angle glaucoma, right eye, moderate stage: Secondary | ICD-10-CM | POA: Diagnosis not present

## 2018-06-16 DIAGNOSIS — H25811 Combined forms of age-related cataract, right eye: Secondary | ICD-10-CM | POA: Diagnosis not present

## 2018-06-16 DIAGNOSIS — H409 Unspecified glaucoma: Secondary | ICD-10-CM | POA: Diagnosis not present

## 2018-06-22 DIAGNOSIS — I712 Thoracic aortic aneurysm, without rupture: Secondary | ICD-10-CM | POA: Diagnosis not present

## 2018-07-01 DIAGNOSIS — E7800 Pure hypercholesterolemia, unspecified: Secondary | ICD-10-CM | POA: Diagnosis not present

## 2018-07-01 DIAGNOSIS — I714 Abdominal aortic aneurysm, without rupture: Secondary | ICD-10-CM | POA: Diagnosis not present

## 2018-07-01 DIAGNOSIS — I10 Essential (primary) hypertension: Secondary | ICD-10-CM | POA: Diagnosis not present

## 2018-07-01 DIAGNOSIS — R12 Heartburn: Secondary | ICD-10-CM | POA: Diagnosis not present

## 2018-07-06 DIAGNOSIS — C61 Malignant neoplasm of prostate: Secondary | ICD-10-CM | POA: Diagnosis not present

## 2018-07-20 DIAGNOSIS — N320 Bladder-neck obstruction: Secondary | ICD-10-CM | POA: Diagnosis not present

## 2018-07-21 DIAGNOSIS — H52209 Unspecified astigmatism, unspecified eye: Secondary | ICD-10-CM | POA: Diagnosis not present

## 2018-07-21 DIAGNOSIS — H524 Presbyopia: Secondary | ICD-10-CM | POA: Diagnosis not present

## 2018-07-21 DIAGNOSIS — H5213 Myopia, bilateral: Secondary | ICD-10-CM | POA: Diagnosis not present

## 2018-08-10 DIAGNOSIS — C61 Malignant neoplasm of prostate: Secondary | ICD-10-CM | POA: Diagnosis not present

## 2018-08-24 DIAGNOSIS — Z23 Encounter for immunization: Secondary | ICD-10-CM | POA: Diagnosis not present

## 2018-10-14 DIAGNOSIS — M9903 Segmental and somatic dysfunction of lumbar region: Secondary | ICD-10-CM | POA: Diagnosis not present

## 2018-10-14 DIAGNOSIS — M9902 Segmental and somatic dysfunction of thoracic region: Secondary | ICD-10-CM | POA: Diagnosis not present

## 2018-10-14 DIAGNOSIS — M5134 Other intervertebral disc degeneration, thoracic region: Secondary | ICD-10-CM | POA: Diagnosis not present

## 2018-10-14 DIAGNOSIS — M5136 Other intervertebral disc degeneration, lumbar region: Secondary | ICD-10-CM | POA: Diagnosis not present

## 2018-10-25 DIAGNOSIS — E7800 Pure hypercholesterolemia, unspecified: Secondary | ICD-10-CM | POA: Diagnosis not present

## 2018-10-25 DIAGNOSIS — R7301 Impaired fasting glucose: Secondary | ICD-10-CM | POA: Diagnosis not present

## 2018-10-25 DIAGNOSIS — Z125 Encounter for screening for malignant neoplasm of prostate: Secondary | ICD-10-CM | POA: Diagnosis not present

## 2018-10-25 DIAGNOSIS — I10 Essential (primary) hypertension: Secondary | ICD-10-CM | POA: Diagnosis not present

## 2018-10-25 DIAGNOSIS — R82998 Other abnormal findings in urine: Secondary | ICD-10-CM | POA: Diagnosis not present

## 2018-11-01 DIAGNOSIS — D692 Other nonthrombocytopenic purpura: Secondary | ICD-10-CM | POA: Diagnosis not present

## 2018-11-01 DIAGNOSIS — C61 Malignant neoplasm of prostate: Secondary | ICD-10-CM | POA: Diagnosis not present

## 2018-11-01 DIAGNOSIS — I10 Essential (primary) hypertension: Secondary | ICD-10-CM | POA: Diagnosis not present

## 2018-11-01 DIAGNOSIS — E7800 Pure hypercholesterolemia, unspecified: Secondary | ICD-10-CM | POA: Diagnosis not present

## 2018-11-01 DIAGNOSIS — I714 Abdominal aortic aneurysm, without rupture: Secondary | ICD-10-CM | POA: Diagnosis not present

## 2018-11-01 DIAGNOSIS — I700 Atherosclerosis of aorta: Secondary | ICD-10-CM | POA: Diagnosis not present

## 2018-11-01 DIAGNOSIS — R7301 Impaired fasting glucose: Secondary | ICD-10-CM | POA: Diagnosis not present

## 2018-11-01 DIAGNOSIS — Z0000 Encounter for general adult medical examination without abnormal findings: Secondary | ICD-10-CM | POA: Diagnosis not present

## 2018-11-01 DIAGNOSIS — E663 Overweight: Secondary | ICD-10-CM | POA: Diagnosis not present

## 2018-11-02 DIAGNOSIS — M5134 Other intervertebral disc degeneration, thoracic region: Secondary | ICD-10-CM | POA: Diagnosis not present

## 2018-11-02 DIAGNOSIS — Z1212 Encounter for screening for malignant neoplasm of rectum: Secondary | ICD-10-CM | POA: Diagnosis not present

## 2018-11-02 DIAGNOSIS — M9902 Segmental and somatic dysfunction of thoracic region: Secondary | ICD-10-CM | POA: Diagnosis not present

## 2018-11-02 DIAGNOSIS — M9903 Segmental and somatic dysfunction of lumbar region: Secondary | ICD-10-CM | POA: Diagnosis not present

## 2018-11-02 DIAGNOSIS — M5136 Other intervertebral disc degeneration, lumbar region: Secondary | ICD-10-CM | POA: Diagnosis not present

## 2018-11-08 DIAGNOSIS — M9902 Segmental and somatic dysfunction of thoracic region: Secondary | ICD-10-CM | POA: Diagnosis not present

## 2018-11-08 DIAGNOSIS — M9903 Segmental and somatic dysfunction of lumbar region: Secondary | ICD-10-CM | POA: Diagnosis not present

## 2018-11-08 DIAGNOSIS — M5134 Other intervertebral disc degeneration, thoracic region: Secondary | ICD-10-CM | POA: Diagnosis not present

## 2018-11-08 DIAGNOSIS — M5136 Other intervertebral disc degeneration, lumbar region: Secondary | ICD-10-CM | POA: Diagnosis not present

## 2018-11-11 DIAGNOSIS — C61 Malignant neoplasm of prostate: Secondary | ICD-10-CM | POA: Diagnosis not present

## 2018-12-17 DIAGNOSIS — C61 Malignant neoplasm of prostate: Secondary | ICD-10-CM | POA: Diagnosis not present

## 2018-12-23 DIAGNOSIS — I714 Abdominal aortic aneurysm, without rupture: Secondary | ICD-10-CM | POA: Diagnosis not present

## 2018-12-30 ENCOUNTER — Encounter: Payer: Self-pay | Admitting: Cardiology

## 2018-12-30 ENCOUNTER — Ambulatory Visit: Payer: Medicare HMO | Admitting: Cardiology

## 2018-12-30 VITALS — BP 124/64 | HR 64 | Ht 69.0 in | Wt 205.7 lb

## 2018-12-30 DIAGNOSIS — E7800 Pure hypercholesterolemia, unspecified: Secondary | ICD-10-CM | POA: Diagnosis not present

## 2018-12-30 DIAGNOSIS — I714 Abdominal aortic aneurysm, without rupture, unspecified: Secondary | ICD-10-CM

## 2018-12-30 DIAGNOSIS — D573 Sickle-cell trait: Secondary | ICD-10-CM | POA: Diagnosis not present

## 2018-12-30 DIAGNOSIS — K219 Gastro-esophageal reflux disease without esophagitis: Secondary | ICD-10-CM | POA: Diagnosis not present

## 2018-12-30 DIAGNOSIS — Z87891 Personal history of nicotine dependence: Secondary | ICD-10-CM | POA: Diagnosis not present

## 2018-12-30 DIAGNOSIS — I10 Essential (primary) hypertension: Secondary | ICD-10-CM

## 2018-12-30 DIAGNOSIS — I700 Atherosclerosis of aorta: Secondary | ICD-10-CM | POA: Diagnosis not present

## 2018-12-30 DIAGNOSIS — R0989 Other specified symptoms and signs involving the circulatory and respiratory systems: Secondary | ICD-10-CM

## 2018-12-30 HISTORY — DX: Essential (primary) hypertension: I10

## 2018-12-30 HISTORY — DX: Personal history of nicotine dependence: Z87.891

## 2018-12-30 HISTORY — DX: Pure hypercholesterolemia, unspecified: E78.00

## 2018-12-30 NOTE — Progress Notes (Signed)
Subjective:   '@Patient'$  ID: Kevin Roup., male    DOB: [birth year], 78 y.o.   MRN: [id number]  Chief Complaint  Patient presents with  . Follow-up  . AAA  . Neck Pain    HPI  Kevin Pacheco is an active Afro-American male with history of hypertension and hyperlipidemia, lipids are well controlled, 4 cm AAA. He has history of prostate cancer in the past, history of 60 pack year history of cigarette smoking, quit in 2002 and thoracic aortic atherosclerosis by chest x-ray. He is here on a six-month office visit and follow-up of AAA.  He has had a low risk nuclear stress test in February 2017 although the EKG portion was abnormal which are suggestive ischemia and he had normal exercise tolerance. Echocardiogram at that time was essentially unremarkable.  Past Medical History:  Diagnosis Date  . Abdominal aortic aneurysm (AAA) >39 mm diameter (HCC) 11/08/1917  . Arthritis   . Cancer [facility])    prostate  . Essential hypertension 12/30/2018  . GERD (gastroesophageal reflux disease)   . H/O tobacco use, presenting hazards to health 12/30/2018   > 50-60 pack year quit 2012  . Headache(784.0)   . Hyperlipidemia   . Hypertension   . Pure hypercholesterolemia 12/30/2018    Past Surgical History:  Procedure Laterality Date  . ARTHROSCOPY KNEE W/ DRILLING Left 05/01/10  . BLADDER TUMOR EXCISION  2009  . CARDIAC CATHETERIZATION     hx 1980's can't remember M D   . COLONOSCOPY  2005, 2012  . FRACTURE SURGERY Right    ankle  . I&D KNEE WITH POLY EXCHANGE Left 03/14/2013   Procedure: total knee revision;  Surgeon: Vickey Huger, MD;  Location: [place];  Service: Orthopedics;  Laterality: Left;  . JOINT REPLACEMENT Left 02/06/11  . PROSTATECTOMY  2001  . urethal sling  2009    Social History   Socioeconomic History  . Marital status: Married    Spouse name: Not on file  . Number of children: 4  . Years of education: Not on file  . Highest education level: Not on file  Occupational  History  . Not on file  Social Needs  . Financial resource strain: Not on file  . Food insecurity:    Worry: Not on file    Inability: Not on file  . Transportation needs:    Medical: Not on file    Non-medical: Not on file  Tobacco Use  . Smoking status: Former Smoker    Types: Cigarettes    Last attempt to quit: 12/24/2000    Years since quitting: 18.0  . Smokeless tobacco: Never Used  Substance and Sexual Activity  . Alcohol use: Yes    Alcohol/week: 4.0 standard drinks    Types: 4 Cans of beer per week  . Drug use: No  . Sexual activity: Not on file  Lifestyle  . Physical activity:    Days per week: Not on file    Minutes per session: Not on file  . Stress: Not on file  Relationships  . Social connections:    Talks on phone: Not on file    Gets together: Not on file    Attends religious service: Not on file    Active member of club or organization: Not on file    Attends meetings of clubs or organizations: Not on file    Relationship status: Not on file  . Intimate partner violence:    Fear of current or ex  partner: Not on file    Emotionally abused: Not on file    Physically abused: Not on file    Forced sexual activity: Not on file  Other Topics Concern  . Not on file  Social History Narrative   ** Merged History Encounter **        Current Outpatient Medications on File Prior to Visit  Medication Sig Dispense Refill  . amLODipine (NORVASC) 10 MG tablet     . aspirin EC 81 MG tablet Take 81 mg by mouth daily.    Marland Kitchen atorvastatin (LIPITOR) 20 MG tablet     . irbesartan (AVAPRO) 150 MG tablet Take 300 mg by mouth daily.     . metoprolol tartrate (LOPRESSOR) 25 MG tablet Take 25 mg by mouth 2 (two) times daily.     Marland Kitchen omeprazole (PRILOSEC) 40 MG capsule Take 40 mg by mouth daily with breakfast.    . timolol (BETIMOL) 0.25 % ophthalmic solution Place 1-2 drops into both eyes 2 (two) times daily.    Marland Kitchen lisinopril (PRINIVIL,ZESTRIL) 20 MG tablet Take 20 mg by mouth.     . simvastatin (ZOCOR) 40 MG tablet Take 40 mg by mouth daily with breakfast.     No current facility-administered medications on file prior to visit.      Review of Systems  Constitutional: Negative for malaise/fatigue and weight loss.  Respiratory: Negative for cough, hemoptysis and shortness of breath.   Cardiovascular: Positive for leg swelling (occasional and at the end of the day). Negative for chest pain, palpitations and claudication.  Gastrointestinal: Negative for abdominal pain, blood in stool, constipation, heartburn and vomiting.  Genitourinary: Negative for dysuria.  Musculoskeletal: Positive for back pain and neck pain (continuous and also hurts to moveneck. also un the upper back). Negative for joint pain and myalgias.  Neurological: Negative for dizziness, focal weakness and headaches.  Endo/Heme/Allergies: Does not bruise/bleed easily.  Psychiatric/Behavioral: Negative for depression. The patient is not nervous/anxious.   All other systems reviewed and are negative.      Objective:  Blood pressure 124/64, pulse 64, height '5\' 9"'$  (1.753 m), weight 205 lb 11.2 oz (93.3 kg), SpO2 99 %.  Physical Exam  Constitutional: He appears well-developed and well-nourished. No distress.  HENT:  Head: Atraumatic.  Eyes: Conjunctivae are normal.  Neck: Neck supple. No JVD present. No thyromegaly present.  Cardiovascular: Normal rate, regular rhythm, normal heart sounds and intact distal pulses. Exam reveals no gallop.  No murmur heard. Pulses:      Carotid pulses are on the right side with bruit and on the left side with bruit. Prominent abdominal aortic pulsation noted, no tenderness  Pulmonary/Chest: Effort normal and breath sounds normal.  Abdominal: Soft. Bowel sounds are normal.  Musculoskeletal: Normal range of motion.        General: No edema.  Neurological: He is alert.  Skin: Skin is warm and dry.  Psychiatric: He has a normal mood and affect.    Abdominal  aortic duplex 12/23/2018: Moderate dilatation of the abdominal aorta. Diffuse plaque observed in the proximal, mid and distal aorta. Normal flow velocities noted. An abdominal aortic aneurysm measuring 4.09 x 4.03 x 4.09 cm is seen. Normal iliac artery velocity. Enlarged inferior vena cava suggests elevated central venous pressure. No significant change from 06/22/18, recheck in 1 year.  Echocardiogram [01/10/2016]:  Left ventricle cavity is normal in size. Mild concentric hypertrophy of the left ventricle. Normal global wall motion. Normal diastolic filling pattern. Calculated EF 55%. Left  atrial cavity is mildly dilated. Trace aortic regurgitation. Mild tricuspid regurgitation. No evidence of pulmonary hypertension.  Nuclear stress test [01/07/2016]:  1. The resting electrocardiogram demonstrated normal sinus rhythm, normal resting conduction and no resting arrhythmias. The stress electrocardiogram was normal. Ther was 2 mm upsloping ST depression with exercise back to baseline at <1 minute into recovery. The patient performed treadmill exercise using a Bruce protocol, completing 6;48 minutes. The patient completed an estimated workload of 8.27 METS, of the maximum predicted heart rate. The stress test was terminated because of fatigue. 2. Myocardial perfusion imaging is normal. Overall left ventricular systolic function was normal without regional wall motion abnormalities. The left ventricular ejection fraction was normal visually but calculated at (44%). This is a low risk study.  Assessment & Recommendations:   1. Abdominal aortic aneurysm (AAA) >39 mm diameter (HCC) No change in size of the aneurysm since 2018, he remains abstinent from tobacco continue annual surveillance.  2. Essential hypertension Controlled EKG 12/30/2018: Sinus bradycardia at rate of 59 bpm,   Borderline criteria for left atrial enlargement.Normal QT interval.  No evidence of ischemia.   3. Pure  hypercholesterolemia Being managed by PCP and well controlled. Although on simvastatin and amlodipine, patient is remained stable without any side effects hence continue the same.  12/14/2017: Cholesterol 139, triglycerides 102, HDL 42, LDL 77.  Glucose 127, EGFR 73/84, creatinine 0.9, sodium 145, potassium 4.6, CMP normal.  4. H/O tobacco use, presenting hazards to health [place]in[place].he has greater than 50-60-pack-year history of smoking.  Low-dose CT scan on 12/16/2017 had revealed aortic and coronary calcification but no high risk lung findings.  5. Upper back and neck pain:  Symptoms of musculoskeletal etiology, reproducible with neck motion.    6.  Bilateral carotid artery bruit, right more prominent.  New from previous examination. I will obtain carotid artery duplex. Otherwise patient remained stable and asymptomatic, continues to remain active, EKG does not reveal any ischemia, I'll see him back in one year with follow-up of abdominal aortic duplex.  Adrian Prows, MD, [facility] 12/30/2018, 6:39 AM [facility]. [place] Pager: [phone] Office: [phone] If no answer Cell [phone]

## 2019-01-18 ENCOUNTER — Other Ambulatory Visit: Payer: Self-pay | Admitting: Cardiology

## 2019-01-18 DIAGNOSIS — Z136 Encounter for screening for cardiovascular disorders: Secondary | ICD-10-CM

## 2019-01-19 ENCOUNTER — Other Ambulatory Visit: Payer: Self-pay | Admitting: Cardiology

## 2019-01-20 ENCOUNTER — Ambulatory Visit (INDEPENDENT_AMBULATORY_CARE_PROVIDER_SITE_OTHER): Payer: Medicare HMO

## 2019-01-20 DIAGNOSIS — R0989 Other specified symptoms and signs involving the circulatory and respiratory systems: Secondary | ICD-10-CM | POA: Diagnosis not present

## 2019-01-23 ENCOUNTER — Telehealth: Payer: Self-pay | Admitting: Cardiology

## 2019-01-23 DIAGNOSIS — R0989 Other specified symptoms and signs involving the circulatory and respiratory systems: Secondary | ICD-10-CM

## 2019-01-23 NOTE — Telephone Encounter (Signed)
Very mild stenosis right. Will recheck in 6 months.

## 2019-03-18 DIAGNOSIS — C61 Malignant neoplasm of prostate: Secondary | ICD-10-CM | POA: Diagnosis not present

## 2019-04-22 DIAGNOSIS — C61 Malignant neoplasm of prostate: Secondary | ICD-10-CM | POA: Diagnosis not present

## 2019-04-26 DIAGNOSIS — H401132 Primary open-angle glaucoma, bilateral, moderate stage: Secondary | ICD-10-CM | POA: Diagnosis not present

## 2019-04-26 DIAGNOSIS — H0014 Chalazion left upper eyelid: Secondary | ICD-10-CM | POA: Diagnosis not present

## 2019-05-12 DIAGNOSIS — C61 Malignant neoplasm of prostate: Secondary | ICD-10-CM | POA: Diagnosis not present

## 2019-05-12 DIAGNOSIS — I10 Essential (primary) hypertension: Secondary | ICD-10-CM | POA: Diagnosis not present

## 2019-05-12 DIAGNOSIS — R809 Proteinuria, unspecified: Secondary | ICD-10-CM | POA: Diagnosis not present

## 2019-05-12 DIAGNOSIS — E663 Overweight: Secondary | ICD-10-CM | POA: Diagnosis not present

## 2019-05-12 DIAGNOSIS — R7301 Impaired fasting glucose: Secondary | ICD-10-CM | POA: Diagnosis not present

## 2019-05-12 DIAGNOSIS — I700 Atherosclerosis of aorta: Secondary | ICD-10-CM | POA: Diagnosis not present

## 2019-05-12 DIAGNOSIS — M1990 Unspecified osteoarthritis, unspecified site: Secondary | ICD-10-CM | POA: Diagnosis not present

## 2019-05-12 DIAGNOSIS — I714 Abdominal aortic aneurysm, without rupture: Secondary | ICD-10-CM | POA: Diagnosis not present

## 2019-05-12 DIAGNOSIS — E7800 Pure hypercholesterolemia, unspecified: Secondary | ICD-10-CM | POA: Diagnosis not present

## 2019-05-13 DIAGNOSIS — E7800 Pure hypercholesterolemia, unspecified: Secondary | ICD-10-CM | POA: Diagnosis not present

## 2019-05-13 DIAGNOSIS — I10 Essential (primary) hypertension: Secondary | ICD-10-CM | POA: Diagnosis not present

## 2019-05-13 DIAGNOSIS — R7301 Impaired fasting glucose: Secondary | ICD-10-CM | POA: Diagnosis not present

## 2019-06-27 DIAGNOSIS — H0014 Chalazion left upper eyelid: Secondary | ICD-10-CM | POA: Diagnosis not present

## 2019-07-18 ENCOUNTER — Other Ambulatory Visit: Payer: Medicare HMO

## 2019-07-22 DIAGNOSIS — C61 Malignant neoplasm of prostate: Secondary | ICD-10-CM | POA: Diagnosis not present

## 2019-07-26 DIAGNOSIS — H0014 Chalazion left upper eyelid: Secondary | ICD-10-CM | POA: Diagnosis not present

## 2019-08-04 DIAGNOSIS — Z23 Encounter for immunization: Secondary | ICD-10-CM | POA: Diagnosis not present

## 2019-08-27 DIAGNOSIS — R05 Cough: Secondary | ICD-10-CM | POA: Diagnosis not present

## 2019-08-27 DIAGNOSIS — Z20828 Contact with and (suspected) exposure to other viral communicable diseases: Secondary | ICD-10-CM | POA: Diagnosis not present

## 2019-08-29 DIAGNOSIS — Z20828 Contact with and (suspected) exposure to other viral communicable diseases: Secondary | ICD-10-CM | POA: Diagnosis not present

## 2019-09-02 DIAGNOSIS — C61 Malignant neoplasm of prostate: Secondary | ICD-10-CM | POA: Diagnosis not present

## 2019-09-20 ENCOUNTER — Ambulatory Visit (INDEPENDENT_AMBULATORY_CARE_PROVIDER_SITE_OTHER): Payer: Medicare HMO

## 2019-09-20 ENCOUNTER — Other Ambulatory Visit: Payer: Self-pay

## 2019-09-20 DIAGNOSIS — R0989 Other specified symptoms and signs involving the circulatory and respiratory systems: Secondary | ICD-10-CM

## 2019-09-21 NOTE — Progress Notes (Signed)
Left message for patient to call back  

## 2019-09-28 ENCOUNTER — Other Ambulatory Visit: Payer: Self-pay | Admitting: Cardiology

## 2019-10-27 DIAGNOSIS — I10 Essential (primary) hypertension: Secondary | ICD-10-CM | POA: Diagnosis not present

## 2019-10-27 DIAGNOSIS — Z125 Encounter for screening for malignant neoplasm of prostate: Secondary | ICD-10-CM | POA: Diagnosis not present

## 2019-10-27 DIAGNOSIS — R7301 Impaired fasting glucose: Secondary | ICD-10-CM | POA: Diagnosis not present

## 2019-10-27 DIAGNOSIS — E7800 Pure hypercholesterolemia, unspecified: Secondary | ICD-10-CM | POA: Diagnosis not present

## 2019-10-28 ENCOUNTER — Other Ambulatory Visit: Payer: Self-pay | Admitting: Cardiology

## 2019-10-28 DIAGNOSIS — R82998 Other abnormal findings in urine: Secondary | ICD-10-CM | POA: Diagnosis not present

## 2019-10-28 DIAGNOSIS — Z20822 Contact with and (suspected) exposure to covid-19: Secondary | ICD-10-CM

## 2019-10-31 LAB — NOVEL CORONAVIRUS, NAA: SARS-CoV-2, NAA: NOT DETECTED

## 2019-11-03 DIAGNOSIS — C61 Malignant neoplasm of prostate: Secondary | ICD-10-CM | POA: Diagnosis not present

## 2019-11-03 DIAGNOSIS — Z1339 Encounter for screening examination for other mental health and behavioral disorders: Secondary | ICD-10-CM | POA: Diagnosis not present

## 2019-11-03 DIAGNOSIS — M1990 Unspecified osteoarthritis, unspecified site: Secondary | ICD-10-CM | POA: Diagnosis not present

## 2019-11-03 DIAGNOSIS — R7301 Impaired fasting glucose: Secondary | ICD-10-CM | POA: Diagnosis not present

## 2019-11-03 DIAGNOSIS — I6529 Occlusion and stenosis of unspecified carotid artery: Secondary | ICD-10-CM | POA: Diagnosis not present

## 2019-11-03 DIAGNOSIS — I700 Atherosclerosis of aorta: Secondary | ICD-10-CM | POA: Diagnosis not present

## 2019-11-03 DIAGNOSIS — E663 Overweight: Secondary | ICD-10-CM | POA: Diagnosis not present

## 2019-11-03 DIAGNOSIS — H409 Unspecified glaucoma: Secondary | ICD-10-CM | POA: Diagnosis not present

## 2019-11-03 DIAGNOSIS — R32 Unspecified urinary incontinence: Secondary | ICD-10-CM | POA: Diagnosis not present

## 2019-11-03 DIAGNOSIS — I714 Abdominal aortic aneurysm, without rupture: Secondary | ICD-10-CM | POA: Diagnosis not present

## 2019-11-19 ENCOUNTER — Other Ambulatory Visit: Payer: Self-pay | Admitting: Cardiology

## 2019-12-02 DIAGNOSIS — C61 Malignant neoplasm of prostate: Secondary | ICD-10-CM | POA: Diagnosis not present

## 2019-12-15 ENCOUNTER — Other Ambulatory Visit: Payer: Medicare HMO

## 2019-12-15 ENCOUNTER — Other Ambulatory Visit: Payer: Self-pay

## 2019-12-15 ENCOUNTER — Ambulatory Visit (INDEPENDENT_AMBULATORY_CARE_PROVIDER_SITE_OTHER): Payer: Medicare HMO

## 2019-12-15 ENCOUNTER — Telehealth: Payer: Self-pay

## 2019-12-15 DIAGNOSIS — I714 Abdominal aortic aneurysm, without rupture, unspecified: Secondary | ICD-10-CM

## 2019-12-15 DIAGNOSIS — Z136 Encounter for screening for cardiovascular disorders: Secondary | ICD-10-CM

## 2019-12-15 HISTORY — DX: Abdominal aortic aneurysm, without rupture, unspecified: I71.40

## 2019-12-15 HISTORY — DX: Abdominal aortic aneurysm, without rupture: I71.4

## 2019-12-15 NOTE — Telephone Encounter (Signed)
Patient is requesting a different stronger acid reflux medication because he feels like the Omeprazole 40mg  just isn't helping him anymore and even after he takes it, he is still having chest tightness. Please advise.

## 2019-12-16 NOTE — Telephone Encounter (Signed)
Please advise 

## 2019-12-16 NOTE — Telephone Encounter (Signed)
Patient stated that he has an appt next week with his PCP and he will ask then.

## 2019-12-16 NOTE — Telephone Encounter (Signed)
He should probably discuss with his PCP regarding this to make sure I am not missing anything abnormal JG

## 2019-12-18 ENCOUNTER — Other Ambulatory Visit: Payer: Self-pay | Admitting: Cardiology

## 2019-12-18 DIAGNOSIS — I714 Abdominal aortic aneurysm, without rupture, unspecified: Secondary | ICD-10-CM

## 2019-12-20 ENCOUNTER — Ambulatory Visit: Payer: Commercial Managed Care - HMO

## 2019-12-21 ENCOUNTER — Telehealth: Payer: Self-pay

## 2019-12-21 NOTE — Telephone Encounter (Signed)
-----   Message from Adrian Prows, MD sent at 12/20/2019  7:46 AM EST ----- 4 cm aaa, no change from previous

## 2019-12-21 NOTE — Telephone Encounter (Signed)
Gave patient results he verbalized understanding

## 2019-12-29 ENCOUNTER — Encounter: Payer: Self-pay | Admitting: Cardiology

## 2019-12-29 ENCOUNTER — Other Ambulatory Visit: Payer: Medicare HMO

## 2019-12-29 ENCOUNTER — Other Ambulatory Visit: Payer: Self-pay

## 2019-12-29 ENCOUNTER — Ambulatory Visit: Payer: Medicare HMO | Admitting: Cardiology

## 2019-12-29 VITALS — BP 120/73 | HR 66 | Temp 96.3°F | Ht 69.0 in | Wt 201.0 lb

## 2019-12-29 DIAGNOSIS — I4510 Unspecified right bundle-branch block: Secondary | ICD-10-CM

## 2019-12-29 DIAGNOSIS — I10 Essential (primary) hypertension: Secondary | ICD-10-CM | POA: Diagnosis not present

## 2019-12-29 DIAGNOSIS — I714 Abdominal aortic aneurysm, without rupture, unspecified: Secondary | ICD-10-CM

## 2019-12-29 DIAGNOSIS — E7800 Pure hypercholesterolemia, unspecified: Secondary | ICD-10-CM | POA: Diagnosis not present

## 2019-12-29 HISTORY — DX: Unspecified right bundle-branch block: I45.10

## 2019-12-29 MED ORDER — AMLODIPINE BESYLATE 10 MG PO TABS: 10.0000 mg | ORAL_TABLET | Freq: Every day | ORAL | 3 refills | Status: DC

## 2019-12-29 NOTE — Progress Notes (Signed)
Primary Physician/Referring:  Kevin Pao, MD  Patient ID: Kevin Pacheco., male    DOB: [birth year], 79 y.o.   MRN: [id number]  Chief Complaint  Patient presents with  . AAA  . Hypertension  . Hyperlipidemia  . Follow-up    1 year   HPI:    Kevin Pacheco.  is a 79 y.o. Afro-American male with history of hypertension and hyperlipidemia, lipids are well controlled, 4 cm AAA. He has history of prostate cancer and presently on Lupron injections and in remission, history of 60 pack year history of cigarette smoking, quit in 2002 and thoracic aortic atherosclerosis by chest x-ray. He is here on a 12 month office visit and follow-up of AAA.  He has had a low risk nuclear stress test in February 2017 although the EKG portion was abnormal which are suggestive ischemia and he had normal exercise tolerance. Echocardiogram at that time was essentially unremarkable.  Remains asymptomatic.  Past Medical History:  Diagnosis Date  . Abdominal aortic aneurysm (AAA) >39 mm diameter (HCC) 11/08/1917  . Arthritis   . Cancer [facility])    prostate  . Essential hypertension 12/30/2018  . GERD (gastroesophageal reflux disease)   . H/O tobacco use, presenting hazards to health 12/30/2018   > 50-60 pack year quit 2012  . Headache(784.0)   . Hyperlipidemia   . Hypertension   . Pure hypercholesterolemia 12/30/2018   Past Surgical History:  Procedure Laterality Date  . ARTHROSCOPY KNEE W/ DRILLING Left 05/01/10  . BLADDER TUMOR EXCISION  2009  . CARDIAC CATHETERIZATION     hx 1980's can't remember M D   . COLONOSCOPY  2005, 2012  . FRACTURE SURGERY Right    ankle  . I & D KNEE WITH POLY EXCHANGE Left 03/14/2013   Procedure: total knee revision;  Surgeon: Vickey Huger, MD;  Location: [place];  Service: Orthopedics;  Laterality: Left;  . JOINT REPLACEMENT Left 02/06/11  . PROSTATECTOMY  2001  . urethal sling  2009   Social History   Tobacco Use  . Smoking status: Former Smoker   Packs/day: 1.00    Years: 40.00    Pack years: 40.00    Types: Cigarettes    Quit date: 12/24/2000    Years since quitting: 19.0  . Smokeless tobacco: Never Used  Substance Use Topics  . Alcohol use: Yes    Alcohol/week: 4.0 standard drinks    Types: 4 Cans of beer per week    Comment: occ    ROS  Review of Systems  Constitution: Negative for weight gain.  Cardiovascular: Negative for dyspnea on exertion, leg swelling and syncope.  Respiratory: Negative for hemoptysis.   Endocrine: Negative for cold intolerance.  Hematologic/Lymphatic: Does not bruise/bleed easily.  Musculoskeletal: Positive for joint pain (knees).  Gastrointestinal: Negative for hematochezia and melena.  Neurological: Negative for headaches and light-headedness.   Objective  Blood pressure 140/73, pulse 66, temperature (!) 96.3 F (35.7 C), height 5\' 9"  (1.753 m), weight 201 lb (91.2 kg), SpO2 94 %.  Vitals with BMI 12/29/2019 12/30/2018 07/01/2018  Height 5\' 9"  5\' 9"  5\' 9"   Weight 201 lbs 205 lbs 11 oz 197 lbs 2 oz  BMI 29.67 [id number]  Systolic [id number]  Diastolic 73 64 60  Pulse 66 64 63     Physical Exam  Constitutional: He appears well-developed and well-nourished.  Neck: No thyromegaly present.  Cardiovascular: Normal rate, regular rhythm, normal heart sounds and intact distal pulses. Exam reveals  no gallop.  No murmur heard. No leg edema, no JVD.  Pulmonary/Chest: Effort normal and breath sounds normal.  Abdominal: Soft. Bowel sounds are normal.  Pulsatile abdominal mass, measures 4 cm. Non-tender  Musculoskeletal:     Cervical back: Neck supple.  Skin: Skin is warm and dry.   Laboratory examination:   External labs 10/27/2019:   Cholesterol, total 135.000 m 10/27/2019 HDL 45 MG/DL 10/27/2019 LDL 54.000 mg 10/27/2019 Triglycerides 181.000 10/27/2019  A1C 5.700 % 10/27/2019 Hemoglobin 10.900 g/ 10/27/2019  Creatinine, Serum 1.000 mg/ 10/27/2019 Potassium 4.000 mm 12/02/2019 ALT (SGPT) 12.000  U/L 12/02/2019  Medications and allergies  No Known Allergies   Current Outpatient Medications  Medication Instructions  . amLODipine (NORVASC) 10 MG tablet TAKE 1 TABLET EVERY DAY  . aspirin EC 81 mg, Oral, Daily  . atorvastatin (LIPITOR) 20 mg, Oral, Daily  . irbesartan (AVAPRO) 300 MG tablet TAKE 1 TABLET EVERY DAY  . metoprolol tartrate (LOPRESSOR) 25 mg, Oral, 2 times daily  . omeprazole (PRILOSEC) 40 mg, Daily with breakfast    Radiology:  No results found.  Cardiac Studies:   Echocardiogram [01/10/2016]:  Left ventricle cavity is normal in size. Mild concentric hypertrophy of the left ventricle. Normal global wall motion. Normal diastolic filling pattern. Calculated EF 55%. Left atrial cavity is mildly dilated. Trace aortic regurgitation. Mild tricuspid regurgitation. No evidence of pulmonary hypertension.  Nuclear stress test [01/07/2016]:  1. The resting electrocardiogram demonstrated normal sinus rhythm, normal resting conduction and no resting arrhythmias. The stress electrocardiogram was normal. Ther was 2 mm upsloping ST depression with exercise back to baseline at <1 minute into recovery. The patient performed treadmill exercise using a Bruce protocol, completing 6;48 minutes. The patient completed an estimated workload of 8.27 METS, of the maximum predicted heart rate. The stress test was terminated because of fatigue. 2. Myocardial perfusion imaging is normal. Overall left ventricular systolic function was normal without regional wall motion abnormalities. The left ventricular ejection fraction was normal visually but calculated at (44%). This is a low risk study.  Abdominal Aortic Duplex 12/15/2019:  A moderate sized abdominal aortic aneurysm measuring 4.03 x 4.01 x 4.24 cm  is seen in the mid aorta.  Mild plaque noted in the proximal, mid and distal aorta.   No significant change from 12/23/2018. Recheck in 1 year  Assessment     ICD-10-CM   1. Abdominal  aortic aneurysm (AAA) >39 mm diameter (HCC)  I71.4 EKG 12-Lead  2. Essential hypertension  I10   3. Pure hypercholesterolemia  E78.00     EKG 12/29/2019: Sinus bradycardia at the rate of 54 bpm, left axis deviation, incomplete right bundle branch block.  Normal QT interval.  No evidence of ischemia.  Overall no significant change from EKG 12/30/2018: Sinus bradycardia at rate of 59 bpm,   Borderline criteria for left atrial enlargement.Normal QT interval.  No evidence of ischemia.   No orders of the defined types were placed in this encounter.   Medications Discontinued During This Encounter  Medication Reason  . irbesartan (AVAPRO) 150 MG tablet Error  . lisinopril (PRINIVIL,ZESTRIL) 20 MG tablet Error  . atorvastatin (LIPITOR) 20 MG tablet Error  . simvastatin (ZOCOR) 40 MG tablet Error  . timolol (BETIMOL) 0.25 % ophthalmic solution Error     Recommendations:   Rhashad Axley.  is a 79 y.o. Afro-American male with history of hypertension and hyperlipidemia, lipids are well controlled, 4 cm AAA. He has history of prostate cancer and presently on Lupron  injections and in remission, history of 60 pack year history of cigarette smoking, quit in 2002 and thoracic aortic atherosclerosis by chest x-ray. He is here on a 12 month office visit and follow-up of AAA.  He has had a low risk nuclear stress test in February 2017 although the EKG portion was abnormal which are suggestive ischemia and he had normal exercise tolerance. Echocardiogram at that time was essentially unremarkable.  He has not had any chest discomfort, no dyspnea, no clinical evidence of heart failure.  Vascular examination except for prominent pulsatile abdominal mass which is nontender is unremarkable.  Abdominal aortic aneurysm size has remained stable.  I will see him back in 1 year.  With regard to hyperlipidemia, lipids are excellent, I reviewed his external labs.  Blood pressure is also very well controlled and I  refilled his amlodipine.  Adrian Prows, MD, [facility] 12/29/2019, 9:22 AM [facility]. [place] Office: [phone]

## 2019-12-30 DIAGNOSIS — C61 Malignant neoplasm of prostate: Secondary | ICD-10-CM | POA: Diagnosis not present

## 2019-12-31 ENCOUNTER — Ambulatory Visit: Payer: Medicare HMO | Attending: Internal Medicine

## 2019-12-31 DIAGNOSIS — Z23 Encounter for immunization: Secondary | ICD-10-CM | POA: Insufficient documentation

## 2019-12-31 NOTE — Progress Notes (Signed)
   [id number]  Name:  Kevin Pacheco.    MRN: [id number] DOB: 03/26/1941  12/31/2019  Mr. Kevin Pacheco was observed post Covid-19 immunization for 15 minutes without incidence. He was provided with Vaccine Information Sheet and instruction to access the V-Safe system.   Mr. Kevin Pacheco was instructed to call 911 with any severe reactions post vaccine: Marland Kitchen Difficulty breathing  . Swelling of your face and throat  . A fast heartbeat  . A bad rash all over your body  . Dizziness and weakness    Immunizations Administered    Name Date Dose VIS Date Route   Pfizer COVID-19 Vaccine 12/31/2019  9:40 AM 0.3 mL 11/04/2019 Intramuscular   Manufacturer: [place]   Lot: [id number]   [place]: [id number]

## 2020-01-06 DIAGNOSIS — C61 Malignant neoplasm of prostate: Secondary | ICD-10-CM | POA: Diagnosis not present

## 2020-01-25 ENCOUNTER — Ambulatory Visit: Payer: Medicare HMO | Attending: Internal Medicine

## 2020-01-25 DIAGNOSIS — Z23 Encounter for immunization: Secondary | ICD-10-CM

## 2020-01-25 NOTE — Progress Notes (Signed)
   [id number]  Name:  Kevin Pacheco.    MRN: [id number] DOB: [birth year]  01/25/2020  Mr. Kevin Pacheco was observed post Covid-19 immunization for 15 minutes without incident. He was provided with Vaccine Information Sheet and instruction to access the V-Safe system.   Mr. Kevin Pacheco was instructed to call 911 with any severe reactions post vaccine: Marland Kitchen Difficulty breathing  . Swelling of face and throat  . A fast heartbeat  . A bad rash all over body  . Dizziness and weakness   Immunizations Administered    Name Date Dose VIS Date Route   Pfizer COVID-19 Vaccine 01/25/2020  8:27 AM 0.3 mL 11/04/2019 Intramuscular   Manufacturer: [place]   Lot: [id number]   [place]: [id number]

## 2020-01-28 ENCOUNTER — Other Ambulatory Visit: Payer: Self-pay | Admitting: Cardiology

## 2020-02-13 DIAGNOSIS — M1711 Unilateral primary osteoarthritis, right knee: Secondary | ICD-10-CM | POA: Diagnosis not present

## 2020-02-16 DIAGNOSIS — M545 Low back pain: Secondary | ICD-10-CM | POA: Diagnosis not present

## 2020-02-21 DIAGNOSIS — H401132 Primary open-angle glaucoma, bilateral, moderate stage: Secondary | ICD-10-CM | POA: Diagnosis not present

## 2020-02-21 DIAGNOSIS — H26493 Other secondary cataract, bilateral: Secondary | ICD-10-CM | POA: Diagnosis not present

## 2020-02-28 DIAGNOSIS — M545 Low back pain: Secondary | ICD-10-CM | POA: Diagnosis not present

## 2020-03-01 ENCOUNTER — Telehealth: Payer: Self-pay

## 2020-03-01 NOTE — Telephone Encounter (Signed)
test

## 2020-03-01 NOTE — Telephone Encounter (Signed)
Did not get what the message is and why I am getting this

## 2020-03-01 NOTE — Telephone Encounter (Signed)
It is small. No to lift heavy objects greater than 20 lbs, but otherwise no restrictions

## 2020-03-01 NOTE — Telephone Encounter (Signed)
Patient is asking if there are any restrictions he should be following due to his abdominal aortic aneurysm. Such as lifestyle changes, diet or medication changes.

## 2020-03-02 NOTE — Telephone Encounter (Signed)
Called pt to inform him about the message above. Pt understood

## 2020-03-06 DIAGNOSIS — M545 Low back pain: Secondary | ICD-10-CM | POA: Diagnosis not present

## 2020-03-13 DIAGNOSIS — M545 Low back pain: Secondary | ICD-10-CM | POA: Diagnosis not present

## 2020-03-22 DIAGNOSIS — M545 Low back pain: Secondary | ICD-10-CM | POA: Diagnosis not present

## 2020-03-26 DIAGNOSIS — M545 Low back pain: Secondary | ICD-10-CM | POA: Diagnosis not present

## 2020-03-27 DIAGNOSIS — M1711 Unilateral primary osteoarthritis, right knee: Secondary | ICD-10-CM | POA: Diagnosis not present

## 2020-03-29 DIAGNOSIS — Z01818 Encounter for other preprocedural examination: Secondary | ICD-10-CM | POA: Diagnosis not present

## 2020-03-29 DIAGNOSIS — M545 Low back pain: Secondary | ICD-10-CM | POA: Diagnosis not present

## 2020-04-02 ENCOUNTER — Encounter: Payer: Self-pay | Admitting: Cardiology

## 2020-04-03 DIAGNOSIS — M545 Low back pain: Secondary | ICD-10-CM | POA: Diagnosis not present

## 2020-04-05 DIAGNOSIS — M545 Low back pain: Secondary | ICD-10-CM | POA: Diagnosis not present

## 2020-04-09 DIAGNOSIS — D638 Anemia in other chronic diseases classified elsewhere: Secondary | ICD-10-CM | POA: Diagnosis not present

## 2020-04-09 DIAGNOSIS — D649 Anemia, unspecified: Secondary | ICD-10-CM | POA: Diagnosis not present

## 2020-04-10 ENCOUNTER — Ambulatory Visit: Payer: Self-pay | Admitting: Orthopedic Surgery

## 2020-04-11 ENCOUNTER — Encounter (HOSPITAL_COMMUNITY): Payer: Self-pay

## 2020-04-11 NOTE — Patient Instructions (Signed)
DUE TO COVID-19 ONLY ONE VISITOR ARE ALLOWED TO COME WITH YOU AND STAY IN THE WAITING ROOM ONLY DURING PRE OP AND PROCEDURE. THEN TWO VISITORS MAY VISIT WITH YOU IN YOUR PRIVATE ROOM DURING VISITING HOURS ONLY!!   COVID SWAB TESTING MUST BE COMPLETED ON:  Saturday, Apr 14, 2020  AT 11:45 AM [address] [place] [facility] enter pre surgical testing line (Must self quarantine after testing. Follow instructions on handout.)              Your procedure is scheduled on: Wednesday, Apr 18, 2020   Report to [facility] Main  Entrance   Report to Short Stay at 5:30 AM   [facility])   Call this number if you have problems the morning of surgery [phone]   Do not eat food:After Midnight.   May have liquids until 4:30 AM day of surgery   CLEAR LIQUID DIET  Foods Allowed                                                                     Foods Excluded  Water, Black Coffee and tea, regular and decaf                             liquids that you cannot  Plain Jell-O in any flavor  (No red)                                           see through such as: Fruit ices (not with fruit pulp)                                     milk, soups, orange juice  Iced Popsicles (No red)                                    All solid food Carbonated beverages, regular and diet                                    Apple juices Sports drinks like Gatorade (No red) Lightly seasoned clear broth or consume(fat free) Sugar, honey syrup  Sample Menu Breakfast                                Lunch                                     Supper Cranberry juice                    Beef broth  Chicken broth Jell-O                                     Grape juice                           Apple juice Coffee or tea                        Jell-O                                      Popsicle                                                Coffee or tea                         Coffee or tea   Complete one Ensure drink the morning of surgery at 4:30 AM the day of surgery.   Oral Hygiene is also important to reduce your risk of infection.                                    Remember - BRUSH YOUR TEETH THE MORNING OF SURGERY WITH YOUR REGULAR TOOTHPASTE   Do NOT smoke after Midnight   Take these medicines the morning of surgery with A SIP OF WATER: Amlodipine, Atorvastatin, Metoprolol, Omeprazole                               You may not have any metal on your body including jewelry, and body piercings             Do not wear lotions, powders, perfumes/cologne, or deodorant                        Men may shave face and neck.   Do not bring valuables to the hospital. [place].   Contacts, dentures or bridgework may not be worn into surgery.   Bring small overnight bag day of surgery.    Patients discharged the day of surgery will not be allowed to drive home.   Special Instructions: Bring a copy of your healthcare power of attorney and living will documents         the day of surgery if you haven't scanned them in before.              Please read over the following fact sheets you were given: IF YOU HAVE QUESTIONS ABOUT YOUR PRE OP INSTRUCTIONS PLEASE CALL [phone]   [place] - Preparing for Surgery Before surgery, you can play an important role.  Because skin is not sterile, your skin needs to be as free of germs as possible.  You can reduce the number of germs on your skin by washing with CHG (chlorahexidine gluconate) soap before surgery.  CHG  is an antiseptic cleaner which kills germs and bonds with the skin to continue killing germs even after washing. Please DO NOT use if you have an allergy to CHG or antibacterial soaps.  If your skin becomes reddened/irritated stop using the CHG and inform your nurse when you arrive at Short Stay. Do not shave (including legs and underarms) for at least 48  hours prior to the first CHG shower.  You may shave your face/neck.  Please follow these instructions carefully:  1.  Shower with CHG Soap the night before surgery and the  morning of surgery.  2.  If you choose to wash your hair, wash your hair first as usual with your normal  shampoo.  3.  After you shampoo, rinse your hair and body thoroughly to remove the shampoo.                             4.  Use CHG as you would any other liquid soap.  You can apply chg directly to the skin and wash.  Gently with a scrungie or clean washcloth.  5.  Apply the CHG Soap to your body ONLY FROM THE NECK DOWN.   Do   not use on face/ open                           Wound or open sores. Avoid contact with eyes, ears mouth and   genitals (private parts).                       Wash face,  Genitals (private parts) with your normal soap.             6.  Wash thoroughly, paying special attention to the area where your    surgery  will be performed.  7.  Thoroughly rinse your body with warm water from the neck down.  8.  DO NOT shower/wash with your normal soap after using and rinsing off the CHG Soap.                9.  Pat yourself dry with a clean towel.            10.  Wear clean pajamas.            11.  Place clean sheets on your bed the night of your first shower and do not  sleep with pets. Day of Surgery : Do not apply any lotions/deodorants the morning of surgery.  Please wear clean clothes to the hospital/surgery center.  FAILURE TO FOLLOW THESE INSTRUCTIONS MAY RESULT IN THE CANCELLATION OF YOUR SURGERY  PATIENT SIGNATURE_________________________________  NURSE SIGNATURE__________________________________  ________________________________________________________________________   Adam Phenix  An incentive spirometer is a tool that can help keep your lungs clear and active. This tool measures how well you are filling your lungs with each breath. Taking long deep breaths may help reverse or  decrease the chance of developing breathing (pulmonary) problems (especially infection) following:  A long period of time when you are unable to move or be active. BEFORE THE PROCEDURE   If the spirometer includes an indicator to show your best effort, your nurse or respiratory therapist will set it to a desired goal.  If possible, sit up straight or lean slightly forward. Try not to slouch.  Hold the incentive spirometer in an upright position. INSTRUCTIONS FOR USE  1. Sit  on the edge of your bed if possible, or sit up as far as you can in bed or on a chair. 2. Hold the incentive spirometer in an upright position. 3. Breathe out normally. 4. Place the mouthpiece in your mouth and seal your lips tightly around it. 5. Breathe in slowly and as deeply as possible, raising the piston or the ball toward the top of the column. 6. Hold your breath for 3-5 seconds or for as long as possible. Allow the piston or ball to fall to the bottom of the column. 7. Remove the mouthpiece from your mouth and breathe out normally. 8. Rest for a few seconds and repeat Steps 1 through 7 at least 10 times every 1-2 hours when you are awake. Take your time and take a few normal breaths between deep breaths. 9. The spirometer may include an indicator to show your best effort. Use the indicator as a goal to work toward during each repetition. 10. After each set of 10 deep breaths, practice coughing to be sure your lungs are clear. If you have an incision (the cut made at the time of surgery), support your incision when coughing by placing a pillow or rolled up towels firmly against it. Once you are able to get out of bed, walk around indoors and cough well. You may stop using the incentive spirometer when instructed by your caregiver.  RISKS AND COMPLICATIONS  Take your time so you do not get dizzy or light-headed.  If you are in pain, you may need to take or ask for pain medication before doing incentive spirometry.  It is harder to take a deep breath if you are having pain. AFTER USE  Rest and breathe slowly and easily.  It can be helpful to keep track of a log of your progress. Your caregiver can provide you with a simple table to help with this. If you are using the spirometer at home, follow these instructions: [place] IF:   You are having difficultly using the spirometer.  You have trouble using the spirometer as often as instructed.  Your pain medication is not giving enough relief while using the spirometer.  You develop fever of 100.5 F (38.1 C) or higher. SEEK IMMEDIATE MEDICAL CARE IF:   You cough up bloody sputum that had not been present before.  You develop fever of 102 F (38.9 C) or greater.  You develop worsening pain at or near the incision site. MAKE SURE YOU:   Understand these instructions.  Will watch your condition.  Will get help right away if you are not doing well or get worse. Document Released: 03/23/2007 Document Revised: 02/02/2012 Document Reviewed: 05/24/2007 ExitCare Patient Information 2014 ExitCare, [place].   ________________________________________________________________________  WHAT IS A BLOOD TRANSFUSION? Blood Transfusion Information  A transfusion is the replacement of blood or some of its parts. Blood is made up of multiple cells which provide different functions.  Red blood cells carry oxygen and are used for blood loss replacement.  White blood cells fight against infection.  Platelets control bleeding.  Plasma helps clot blood.  Other blood products are available for specialized needs, such as hemophilia or other clotting disorders. BEFORE THE TRANSFUSION  Who gives blood for transfusions?   Healthy volunteers who are fully evaluated to make sure their blood is safe. This is blood bank blood. Transfusion therapy is the safest it has ever been in the practice of medicine. Before blood is taken from a donor, a complete  history is taken to make sure that person has no history of diseases nor engages in risky social behavior (examples are intravenous drug use or sexual activity with multiple partners). The donor's travel history is screened to minimize risk of transmitting infections, such as malaria. The donated blood is tested for signs of infectious diseases, such as HIV and hepatitis. The blood is then tested to be sure it is compatible with you in order to minimize the chance of a transfusion reaction. If you or a relative donates blood, this is often done in anticipation of surgery and is not appropriate for emergency situations. It takes many days to process the donated blood. RISKS AND COMPLICATIONS Although transfusion therapy is very safe and saves many lives, the main dangers of transfusion include:   Getting an infectious disease.  Developing a transfusion reaction. This is an allergic reaction to something in the blood you were given. Every precaution is taken to prevent this. The decision to have a blood transfusion has been considered carefully by your caregiver before blood is given. Blood is not given unless the benefits outweigh the risks. AFTER THE TRANSFUSION  Right after receiving a blood transfusion, you will usually feel much better and more energetic. This is especially true if your red blood cells have gotten low (anemic). The transfusion raises the level of the red blood cells which carry oxygen, and this usually causes an energy increase.  The nurse administering the transfusion will monitor you carefully for complications. HOME CARE INSTRUCTIONS  No special instructions are needed after a transfusion. You may find your energy is better. Speak with your caregiver about any limitations on activity for underlying diseases you may have. SEEK MEDICAL CARE IF:   Your condition is not improving after your transfusion.  You develop redness or irritation at the intravenous (IV) site. SEEK  IMMEDIATE MEDICAL CARE IF:  Any of the following symptoms occur over the next 12 hours:  Shaking chills.  You have a temperature by mouth above 102 F (38.9 C), not controlled by medicine.  Chest, back, or muscle pain.  People around you feel you are not acting correctly or are confused.  Shortness of breath or difficulty breathing.  Dizziness and fainting.  You get a rash or develop hives.  You have a decrease in urine output.  Your urine turns a dark color or changes to pink, red, or brown. Any of the following symptoms occur over the next 10 days:  You have a temperature by mouth above 102 F (38.9 C), not controlled by medicine.  Shortness of breath.  Weakness after normal activity.  The white part of the eye turns yellow (jaundice).  You have a decrease in the amount of urine or are urinating less often.  Your urine turns a dark color or changes to pink, red, or brown. Document Released: 11/07/2000 Document Revised: 02/02/2012 Document Reviewed: 06/26/2008 [facility] Patient Information 2014 [place], [place].  _______________________________________________________________________

## 2020-04-12 ENCOUNTER — Encounter: Payer: Self-pay | Admitting: Cardiology

## 2020-04-13 DIAGNOSIS — C61 Malignant neoplasm of prostate: Secondary | ICD-10-CM | POA: Diagnosis not present

## 2020-04-14 ENCOUNTER — Other Ambulatory Visit (HOSPITAL_COMMUNITY)
Admission: RE | Admit: 2020-04-14 | Discharge: 2020-04-14 | Disposition: A | Discharge status: Home / Self Care | Payer: Medicare HMO | Source: Ambulatory Visit | Attending: Orthopedic Surgery | Admitting: Orthopedic Surgery

## 2020-04-14 DIAGNOSIS — Z01812 Encounter for preprocedural laboratory examination: Secondary | ICD-10-CM | POA: Diagnosis not present

## 2020-04-14 DIAGNOSIS — Z20822 Contact with and (suspected) exposure to covid-19: Secondary | ICD-10-CM | POA: Insufficient documentation

## 2020-04-14 LAB — SARS CORONAVIRUS 2 (TAT 6-24 HRS): SARS Coronavirus 2: NEGATIVE

## 2020-04-16 ENCOUNTER — Encounter (HOSPITAL_COMMUNITY)
Admission: RE | Admit: 2020-04-16 | Discharge: 2020-04-16 | Disposition: A | Discharge status: Home / Self Care | Payer: Medicare HMO | Source: Ambulatory Visit | Attending: Orthopedic Surgery | Admitting: Orthopedic Surgery

## 2020-04-16 ENCOUNTER — Ambulatory Visit: Payer: Self-pay | Admitting: Orthopedic Surgery

## 2020-04-16 ENCOUNTER — Encounter (HOSPITAL_COMMUNITY): Payer: Self-pay

## 2020-04-16 ENCOUNTER — Other Ambulatory Visit: Payer: Self-pay

## 2020-04-16 DIAGNOSIS — D573 Sickle-cell trait: Secondary | ICD-10-CM | POA: Diagnosis not present

## 2020-04-16 DIAGNOSIS — Z01812 Encounter for preprocedural laboratory examination: Secondary | ICD-10-CM | POA: Diagnosis not present

## 2020-04-16 DIAGNOSIS — K219 Gastro-esophageal reflux disease without esophagitis: Secondary | ICD-10-CM | POA: Insufficient documentation

## 2020-04-16 DIAGNOSIS — E7800 Pure hypercholesterolemia, unspecified: Secondary | ICD-10-CM | POA: Diagnosis not present

## 2020-04-16 DIAGNOSIS — M1711 Unilateral primary osteoarthritis, right knee: Secondary | ICD-10-CM | POA: Insufficient documentation

## 2020-04-16 DIAGNOSIS — Z79899 Other long term (current) drug therapy: Secondary | ICD-10-CM | POA: Insufficient documentation

## 2020-04-16 DIAGNOSIS — Z7901 Long term (current) use of anticoagulants: Secondary | ICD-10-CM | POA: Insufficient documentation

## 2020-04-16 DIAGNOSIS — Z7982 Long term (current) use of aspirin: Secondary | ICD-10-CM | POA: Insufficient documentation

## 2020-04-16 DIAGNOSIS — I10 Essential (primary) hypertension: Secondary | ICD-10-CM | POA: Insufficient documentation

## 2020-04-16 DIAGNOSIS — I714 Abdominal aortic aneurysm, without rupture: Secondary | ICD-10-CM | POA: Insufficient documentation

## 2020-04-16 HISTORY — DX: Unspecified urinary incontinence: R32

## 2020-04-16 HISTORY — DX: Sickle-cell trait: D57.3

## 2020-04-16 HISTORY — DX: Personal history of other diseases of the nervous system and sense organs: Z86.69

## 2020-04-16 HISTORY — DX: Malignant neoplasm of prostate: C61

## 2020-04-16 HISTORY — DX: Occlusion and stenosis of bilateral carotid arteries: I65.23

## 2020-04-16 LAB — CBC
HCT: 34 % — ABNORMAL LOW (ref 39.0–52.0)
Hemoglobin: 11.3 g/dL — ABNORMAL LOW (ref 13.0–17.0)
MCH: 30.5 pg (ref 26.0–34.0)
MCHC: 33.2 g/dL (ref 30.0–36.0)
MCV: 91.9 fL (ref 80.0–100.0)
Platelets: 195 10*3/uL (ref 150–400)
RBC: 3.7 MIL/uL — ABNORMAL LOW (ref 4.22–5.81)
RDW: 13.7 % (ref 11.5–15.5)
WBC: 4.4 10*3/uL (ref 4.0–10.5)
nRBC: 0 % (ref 0.0–0.2)

## 2020-04-16 LAB — COMPREHENSIVE METABOLIC PANEL
ALT: 19 U/L (ref 0–44)
AST: 19 U/L (ref 15–41)
Albumin: 4.1 g/dL (ref 3.5–5.0)
Alkaline Phosphatase: 63 U/L (ref 38–126)
Anion gap: 9 (ref 5–15)
BUN: 18 mg/dL (ref 8–23)
CO2: 26 mmol/L (ref 22–32)
Calcium: 9.2 mg/dL (ref 8.9–10.3)
Chloride: 107 mmol/L (ref 98–111)
Creatinine, Ser: 1.15 mg/dL (ref 0.61–1.24)
GFR calc Af Amer: >60 mL/min (ref >60)
GFR calc non Af Amer: 60 mL/min — ABNORMAL LOW (ref >60)
Glucose, Bld: 111 mg/dL — ABNORMAL HIGH (ref 70–99)
Potassium: 3.4 mmol/L — ABNORMAL LOW (ref 3.5–5.1)
Sodium: 142 mmol/L (ref 135–145)
Total Bilirubin: 0.5 mg/dL (ref 0.3–1.2)
Total Protein: 7.2 g/dL (ref 6.5–8.1)

## 2020-04-16 LAB — URINALYSIS, ROUTINE W REFLEX MICROSCOPIC
Bilirubin Urine: NEGATIVE
Glucose, UA: NEGATIVE mg/dL
Hgb urine dipstick: NEGATIVE
Ketones, ur: NEGATIVE mg/dL
Nitrite: NEGATIVE
Protein, ur: NEGATIVE mg/dL
Specific Gravity, Urine: 1.012 (ref 1.005–1.030)
pH: 5 (ref 5.0–8.0)

## 2020-04-16 LAB — PROTIME-INR
INR: 0.9 (ref 0.8–1.2)
Prothrombin Time: 12 seconds (ref 11.4–15.2)

## 2020-04-16 LAB — SURGICAL PCR SCREEN
MRSA, PCR: NEGATIVE
Staphylococcus aureus: NEGATIVE

## 2020-04-16 NOTE — H&P (View-Only) (Signed)
TOTAL KNEE ADMISSION H&P  Patient is being admitted for right total knee arthroplasty.  Subjective:  Chief Complaint:right knee pain.  HPI: Kevin Roup., 79 y.o. male, has a history of pain and functional disability in the right knee due to arthritis and has failed non-surgical conservative treatments for greater than 12 weeks to includeNSAID's and/or analgesics, corticosteriod injections, viscosupplementation injections, flexibility and strengthening excercises, use of assistive devices, weight reduction as appropriate and activity modification.  Onset of symptoms was gradual, starting >10 years ago with gradually worsening course since that time. The patient noted no past surgery on the right knee(s).  Patient currently rates pain in the right knee(s) at 10 out of 10 with activity. Patient has night pain, worsening of pain with activity and weight bearing, pain that interferes with activities of daily living, pain with passive range of motion, crepitus and joint swelling.  Patient has evidence of subchondral cysts, subchondral sclerosis, periarticular osteophytes and joint space narrowing by imaging studies.There is no active infection.  Patient Active Problem List   Diagnosis Date Noted  . Essential hypertension 12/30/2018  . Pure hypercholesterolemia 12/30/2018  . H/O tobacco use, presenting hazards to health 12/30/2018  . Sickle cell trait ([place]) 12/30/2018  . GERD (gastroesophageal reflux disease) 12/30/2018  . Thoracic aorta atherosclerosis ([place]) 12/30/2018  . Prostate cancer ([place]) 10/26/2015  . Abdominal aortic aneurysm (AAA) >39 mm diameter ([facility]) 11/08/1917   Past Medical History:  Diagnosis Date  . AAA (abdominal aortic aneurysm) ([place]) 12/15/2019   A moderate sized abdominal aortic aneurysm measuring 4.03 x 4.01 x 4.24 cm is seen in the mid aorta.  . Arthritis   . Bilateral carotid artery stenosis   . Cancer [facility])    prostate  . Essential hypertension 12/30/2018  . GERD  (gastroesophageal reflux disease)   . H/O tobacco use, presenting hazards to health 12/30/2018   > 50-60 pack year quit 2012  . Headache(784.0)   . Hyperlipidemia   . Hypertension   . Incomplete RBBB 12/29/2019   Noted on EKG  . Pure hypercholesterolemia 12/30/2018    Past Surgical History:  Procedure Laterality Date  . ARTHROSCOPY KNEE W/ DRILLING Left 05/01/10  . BLADDER TUMOR EXCISION  2009  . CARDIAC CATHETERIZATION     hx 1980's can't remember M D   . COLONOSCOPY  2005, 2012  . FRACTURE SURGERY Right    ankle  . I & D KNEE WITH POLY EXCHANGE Left 03/14/2013   Procedure: total knee revision;  Surgeon: Vickey Huger, MD;  Location: [place];  Service: Orthopedics;  Laterality: Left;  . JOINT REPLACEMENT Left 02/06/11  . PROSTATECTOMY  2001  . urethal sling  2009    Current Outpatient Medications  Medication Sig Dispense Refill Last Dose  . amLODipine (NORVASC) 10 MG tablet Take 1 tablet (10 mg total) by mouth daily. 90 tablet 3   . aspirin EC 81 MG tablet Take 81 mg by mouth daily.     Marland Kitchen aspirin-sod bicarb-citric acid (ALKA-SELTZER) 325 MG TBEF tablet Take 650 mg by mouth every 6 (six) hours as needed (indigestion).     Marland Kitchen atorvastatin (LIPITOR) 20 MG tablet TAKE 1 TABLET EVERY DAY (Patient taking differently: Take 20 mg by mouth daily. ) 90 tablet 1   . irbesartan (AVAPRO) 300 MG tablet TAKE 1 TABLET EVERY DAY (Patient taking differently: Take 300 mg by mouth daily. ) 90 tablet 3   . Iron-Vitamin C (VITRON-C PO) Take 1 tablet by mouth daily.     Marland Kitchen  leuprolide (LUPRON) 7.5 MG injection Inject 7.5 mg into the muscle every 3 (three) months.     . metoprolol tartrate (LOPRESSOR) 25 MG tablet Take 25 mg by mouth 2 (two) times daily.      Marland Kitchen omeprazole (PRILOSEC) 40 MG capsule Take 40 mg by mouth daily with breakfast.      No current facility-administered medications for this visit.   No Known Allergies  Social History   Tobacco Use  . Smoking status: Former Smoker    Packs/day: 1.00     Years: 40.00    Pack years: 40.00    Types: Cigarettes    Quit date: 12/24/2000    Years since quitting: 19.3  . Smokeless tobacco: Never Used  Substance Use Topics  . Alcohol use: Yes    Alcohol/week: 4.0 standard drinks    Types: 4 Cans of beer per week    Comment: occ    No family history on file.   Review of Systems  Constitutional: Negative.   HENT: Negative.   Eyes: Negative.   Respiratory: Negative.   Cardiovascular: Negative.   Gastrointestinal: Negative.   Endocrine: Negative.   Genitourinary: Negative.   Musculoskeletal: Positive for arthralgias.  Allergic/Immunologic: Negative.   Neurological: Negative.   Hematological: Negative.   Psychiatric/Behavioral: Negative.     Objective:  Physical Exam  Vitals reviewed. Constitutional: He appears well-developed and well-nourished.  HENT:  Head: Normocephalic and atraumatic.  Eyes: Pupils are equal, round, and reactive to light. Conjunctivae and EOM are normal.  Neck: No thyromegaly present.  Cardiovascular: Normal rate, regular rhythm and intact distal pulses.  Respiratory: Effort normal. No respiratory distress.  GI: Soft. He exhibits no distension.  Genitourinary:    Genitourinary Comments: deferred   Musculoskeletal:     Cervical back: Normal range of motion.     Right knee: Swelling, deformity and effusion present. Decreased range of motion. Tenderness present over the medial joint line and lateral joint line. Abnormal alignment.  Neurological: He is alert. He has normal reflexes.  Skin: Skin is warm and dry.  Psychiatric: He has a normal mood and affect. His behavior is normal. Judgment and thought content normal.    Vital signs in last 24 hours: @VSRANGES @  Labs:   Estimated body mass index is 29.68 kg/m as calculated from the following:   Height as of 12/29/19: 5\' 9"  (1.753 m).   Weight as of 12/29/19: 91.2 kg.   Imaging Review Plain radiographs demonstrate severe degenerative joint disease of the  right knee(s). The overall alignment issignificant varus. The bone quality appears to be adequate for age and reported activity level.      Assessment/Plan:  End stage arthritis, right knee   The patient history, physical examination, clinical judgment of the provider and imaging studies are consistent with end stage degenerative joint disease of the right knee(s) and total knee arthroplasty is deemed medically necessary. The treatment options including medical management, injection therapy arthroscopy and arthroplasty were discussed at length. The risks and benefits of total knee arthroplasty were presented and reviewed. The risks due to aseptic loosening, infection, stiffness, patella tracking problems, thromboembolic complications and other imponderables were discussed. The patient acknowledged the explanation, agreed to proceed with the plan and consent was signed. Patient is being admitted for inpatient treatment for surgery, pain control, PT, OT, prophylactic antibiotics, VTE prophylaxis, progressive ambulation and ADL's and discharge planning. The patient is planning to be discharged home with OPPT    Anticipated LOS equal to or greater  than 2 midnights due to - Age 55 and older with one or more of the following:  - Obesity  - Expected need for hospital services (PT, OT, Nursing) required for safe  discharge  - Anticipated need for postoperative skilled nursing care or inpatient rehab  - Active co-morbidities: Coronary Artery Disease OR   - Unanticipated findings during/Post Surgery: None  - Patient is a high risk of re-admission due to: None

## 2020-04-16 NOTE — H&P (Signed)
TOTAL KNEE ADMISSION H&P  Patient is being admitted for right total knee arthroplasty.  Subjective:  Chief Complaint:right knee pain.  HPI: Kevin Roup., 79 y.o. male, has a history of pain and functional disability in the right knee due to arthritis and has failed non-surgical conservative treatments for greater than 12 weeks to includeNSAID's and/or analgesics, corticosteriod injections, viscosupplementation injections, flexibility and strengthening excercises, use of assistive devices, weight reduction as appropriate and activity modification.  Onset of symptoms was gradual, starting >10 years ago with gradually worsening course since that time. The patient noted no past surgery on the right knee(s).  Patient currently rates pain in the right knee(s) at 10 out of 10 with activity. Patient has night pain, worsening of pain with activity and weight bearing, pain that interferes with activities of daily living, pain with passive range of motion, crepitus and joint swelling.  Patient has evidence of subchondral cysts, subchondral sclerosis, periarticular osteophytes and joint space narrowing by imaging studies.There is no active infection.  Patient Active Problem List   Diagnosis Date Noted  . Essential hypertension 12/30/2018  . Pure hypercholesterolemia 12/30/2018  . H/O tobacco use, presenting hazards to health 12/30/2018  . Sickle cell trait ([place]) 12/30/2018  . GERD (gastroesophageal reflux disease) 12/30/2018  . Thoracic aorta atherosclerosis ([place]) 12/30/2018  . Prostate cancer ([place]) 10/26/2015  . Abdominal aortic aneurysm (AAA) >39 mm diameter ([facility]) 11/08/1917   Past Medical History:  Diagnosis Date  . AAA (abdominal aortic aneurysm) ([place]) 12/15/2019   A moderate sized abdominal aortic aneurysm measuring 4.03 x 4.01 x 4.24 cm is seen in the mid aorta.  . Arthritis   . Bilateral carotid artery stenosis   . Cancer [facility])    prostate  . Essential hypertension 12/30/2018  . GERD  (gastroesophageal reflux disease)   . H/O tobacco use, presenting hazards to health 12/30/2018   > 50-60 pack year quit 2012  . Headache(784.0)   . Hyperlipidemia   . Hypertension   . Incomplete RBBB 12/29/2019   Noted on EKG  . Pure hypercholesterolemia 12/30/2018    Past Surgical History:  Procedure Laterality Date  . ARTHROSCOPY KNEE W/ DRILLING Left 05/01/10  . BLADDER TUMOR EXCISION  2009  . CARDIAC CATHETERIZATION     hx 1980's can't remember M D   . COLONOSCOPY  2005, 2012  . FRACTURE SURGERY Right    ankle  . I & D KNEE WITH POLY EXCHANGE Left 03/14/2013   Procedure: total knee revision;  Surgeon: Vickey Huger, MD;  Location: [place];  Service: Orthopedics;  Laterality: Left;  . JOINT REPLACEMENT Left 02/06/11  . PROSTATECTOMY  2001  . urethal sling  2009    Current Outpatient Medications  Medication Sig Dispense Refill Last Dose  . amLODipine (NORVASC) 10 MG tablet Take 1 tablet (10 mg total) by mouth daily. 90 tablet 3   . aspirin EC 81 MG tablet Take 81 mg by mouth daily.     Marland Kitchen aspirin-sod bicarb-citric acid (ALKA-SELTZER) 325 MG TBEF tablet Take 650 mg by mouth every 6 (six) hours as needed (indigestion).     Marland Kitchen atorvastatin (LIPITOR) 20 MG tablet TAKE 1 TABLET EVERY DAY (Patient taking differently: Take 20 mg by mouth daily. ) 90 tablet 1   . irbesartan (AVAPRO) 300 MG tablet TAKE 1 TABLET EVERY DAY (Patient taking differently: Take 300 mg by mouth daily. ) 90 tablet 3   . Iron-Vitamin C (VITRON-C PO) Take 1 tablet by mouth daily.     Marland Kitchen  leuprolide (LUPRON) 7.5 MG injection Inject 7.5 mg into the muscle every 3 (three) months.     . metoprolol tartrate (LOPRESSOR) 25 MG tablet Take 25 mg by mouth 2 (two) times daily.      Marland Kitchen omeprazole (PRILOSEC) 40 MG capsule Take 40 mg by mouth daily with breakfast.      No current facility-administered medications for this visit.   No Known Allergies  Social History   Tobacco Use  . Smoking status: Former Smoker    Packs/day: 1.00     Years: 40.00    Pack years: 40.00    Types: Cigarettes    Quit date: 12/24/2000    Years since quitting: 19.3  . Smokeless tobacco: Never Used  Substance Use Topics  . Alcohol use: Yes    Alcohol/week: 4.0 standard drinks    Types: 4 Cans of beer per week    Comment: occ    No family history on file.   Review of Systems  Constitutional: Negative.   HENT: Negative.   Eyes: Negative.   Respiratory: Negative.   Cardiovascular: Negative.   Gastrointestinal: Negative.   Endocrine: Negative.   Genitourinary: Negative.   Musculoskeletal: Positive for arthralgias.  Allergic/Immunologic: Negative.   Neurological: Negative.   Hematological: Negative.   Psychiatric/Behavioral: Negative.     Objective:  Physical Exam  Vitals reviewed. Constitutional: He appears well-developed and well-nourished.  HENT:  Head: Normocephalic and atraumatic.  Eyes: Pupils are equal, round, and reactive to light. Conjunctivae and EOM are normal.  Neck: No thyromegaly present.  Cardiovascular: Normal rate, regular rhythm and intact distal pulses.  Respiratory: Effort normal. No respiratory distress.  GI: Soft. He exhibits no distension.  Genitourinary:    Genitourinary Comments: deferred   Musculoskeletal:     Cervical back: Normal range of motion.     Right knee: Swelling, deformity and effusion present. Decreased range of motion. Tenderness present over the medial joint line and lateral joint line. Abnormal alignment.  Neurological: He is alert. He has normal reflexes.  Skin: Skin is warm and dry.  Psychiatric: He has a normal mood and affect. His behavior is normal. Judgment and thought content normal.    Vital signs in last 24 hours: @VSRANGES @  Labs:   Estimated body mass index is 29.68 kg/m as calculated from the following:   Height as of 12/29/19: 5\' 9"  (1.753 m).   Weight as of 12/29/19: 91.2 kg.   Imaging Review Plain radiographs demonstrate severe degenerative joint disease of the  right knee(s). The overall alignment issignificant varus. The bone quality appears to be adequate for age and reported activity level.      Assessment/Plan:  End stage arthritis, right knee   The patient history, physical examination, clinical judgment of the provider and imaging studies are consistent with end stage degenerative joint disease of the right knee(s) and total knee arthroplasty is deemed medically necessary. The treatment options including medical management, injection therapy arthroscopy and arthroplasty were discussed at length. The risks and benefits of total knee arthroplasty were presented and reviewed. The risks due to aseptic loosening, infection, stiffness, patella tracking problems, thromboembolic complications and other imponderables were discussed. The patient acknowledged the explanation, agreed to proceed with the plan and consent was signed. Patient is being admitted for inpatient treatment for surgery, pain control, PT, OT, prophylactic antibiotics, VTE prophylaxis, progressive ambulation and ADL's and discharge planning. The patient is planning to be discharged home with OPPT    Anticipated LOS equal to or greater  than 2 midnights due to - Age 13 and older with one or more of the following:  - Obesity  - Expected need for hospital services (PT, OT, Nursing) required for safe  discharge  - Anticipated need for postoperative skilled nursing care or inpatient rehab  - Active co-morbidities: Coronary Artery Disease OR   - Unanticipated findings during/Post Surgery: None  - Patient is a high risk of re-admission due to: None

## 2020-04-16 NOTE — Progress Notes (Signed)
PCP - Dr. Alfonso Patten. Tisovec last office visit 10/2019 Cardiologist - Dr. Christen Butter last office visit 12/29/2019 in epic  Chest x-ray - greater than 1year EKG - 12/29/19 in epic Stress Test - greater than 2 years ECHO - unsure Cardiac Cath - greater than 2 years  Sleep Study - N/A CPAP - N/A  Fasting Blood Sugar - N/A Checks Blood Sugar __N/A___ times a day  Blood Thinner Instructions:  N/A Aspirin Instructions: Yes Last Dose: Wil stop 3 days prior to surgery  Anesthesia review: AAA  Patient denies shortness of breath, fever, cough and chest pain at PAT appointment   Patient verbalized understanding of instructions that were given to them at the PAT appointment. Patient was also instructed that they will need to review over the PAT instructions again at home before surgery.

## 2020-04-17 NOTE — Progress Notes (Signed)
Anesthesia Chart Review   Case: [id number] Date/Time: 04/18/20 0715   Procedure: COMPUTER ASSISTED TOTAL KNEE ARTHROPLASTY (Right Knee)   Anesthesia type: Spinal   Pre-op diagnosis: degenerative joint disease right knee   Location: [facility] ROOM 07 / [facility]   Surgeons: Rod Can, MD      DISCUSSION:79 y.o. former smoker (40 pack years, quit 12/24/00) with h/o GERD, HTN, sickle cell trait, prostate cancer (on Lupron injections, in remission), 4 cm AAA, right knee djd scheduled for above procedure 04/18/2020 with Dr. Rod Can.   Pt last seen by cardiologist, Dr. Adrian Prows, 12/29/2019.  Per OV note, "He has had a low risk nuclear stress test in February 2017 although the EKG portion was abnormal which are suggestive ischemia and he had normal exercise tolerance. Echocardiogram at that time was essentially unremarkable.  He has not had any chest discomfort, no dyspnea, no clinical evidence of heart failure.  Vascular examination except for prominent pulsatile abdominal mass which is nontender is unremarkable.  Abdominal aortic aneurysm size has remained stable.  I will see him back in 1 year."  Anticipate pt can proceed with planned procedure barring acute status change.   VS: BP (!) 143/58   Pulse 68   Temp 36.7 C (Oral)   Resp 16   Ht 5\' 9"  (1.753 m)   Wt 93.4 kg   SpO2 100%   BMI 30.42 kg/m   PROVIDERS: Tisovec, [name], MD is PCP   Adrian Prows, MD is Cardiologist  LABS: Labs reviewed: Acceptable for surgery. (all labs ordered are listed, but only abnormal results are displayed)  Labs Reviewed  CBC - Abnormal; Notable for the following components:      Result Value   RBC 3.70 (*)    Hemoglobin 11.3 (*)    HCT 34.0 (*)    All other components within normal limits  COMPREHENSIVE METABOLIC PANEL - Abnormal; Notable for the following components:   Potassium 3.4 (*)    Glucose, Bld 111 (*)    GFR calc non Af Amer 60 (*)    All other components within normal limits   URINALYSIS, ROUTINE W REFLEX MICROSCOPIC - Abnormal; Notable for the following components:   Leukocytes,Ua SMALL (*)    Bacteria, UA MANY (*)    All other components within normal limits  SURGICAL PCR SCREEN  PROTIME-INR  TYPE AND SCREEN     IMAGES: Abdominal Aortic Duplex 12/15/2019:  A moderate sized abdominal aortic aneurysm measuring 4.03 x 4.01 x 4.24 cm  is seen in the mid aorta.  Mild plaque noted in the proximal, mid and distal aorta.   No significant change from 12/23/2018. Recheck in 1 year.   EKG: EKG 12/29/2019: Sinus bradycardia at the rate of 54 bpm, left axis deviation, incomplete right bundle branch block.  Normal QT interval.  No evidence of ischemia.   CV: Echocardiogram [01/10/2016]:  Left ventricle cavity is normal in size. Mild concentric hypertrophy of the left ventricle. Normal global wall motion. Normal diastolic filling pattern. Calculated EF 55%.  Past Medical History:  Diagnosis Date  . AAA (abdominal aortic aneurysm) ([place]) 12/15/2019   A moderate sized abdominal aortic aneurysm measuring 4.03 x 4.01 x 4.24 cm is seen in the mid aorta.  . Arthritis   . Bilateral carotid artery stenosis   . Essential hypertension 12/30/2018  . GERD (gastroesophageal reflux disease)   . H/O tobacco use, presenting hazards to health 12/30/2018   > 50-60 pack year quit 2012  .  Headache(784.0)   . History of glaucoma   . Incomplete RBBB 12/29/2019   Noted on EKG  . Prostate cancer ([place])   . Pure hypercholesterolemia 12/30/2018  . Sickle cell trait ([place])   . Urinary incontinence     Past Surgical History:  Procedure Laterality Date  . ARTHROSCOPY KNEE W/ DRILLING Left 05/01/10  . BLADDER TUMOR EXCISION  2009  . CARDIAC CATHETERIZATION     hx 1980's can't remember M D   . CATARACT EXTRACTION, BILATERAL    . COLONOSCOPY  2005, 2012  . FRACTURE SURGERY Right    ankle  . I & D KNEE WITH POLY EXCHANGE Left 03/14/2013   Procedure: total knee revision;  Surgeon: Vickey Huger, MD;  Location: [facility];  Service: Orthopedics;  Laterality: Left;  . JOINT REPLACEMENT Left 02/06/11  . PROSTATECTOMY  2001  . urethal sling  2009    MEDICATIONS: . amLODipine (NORVASC) 10 MG tablet  . aspirin EC 81 MG tablet  . aspirin-sod bicarb-citric acid (ALKA-SELTZER) 325 MG TBEF tablet  . atorvastatin (LIPITOR) 20 MG tablet  . irbesartan (AVAPRO) 300 MG tablet  . Iron-Vitamin C (VITRON-C PO)  . leuprolide (LUPRON) 7.5 MG injection  . metoprolol tartrate (LOPRESSOR) 25 MG tablet  . omeprazole (PRILOSEC) 40 MG capsule   No current facility-administered medications for this encounter.    Maia Plan [facility] Pre-Surgical Testing [phone] 04/17/20 9:59 AM

## 2020-04-17 NOTE — Anesthesia Preprocedure Evaluation (Addendum)
Anesthesia Evaluation  Patient identified by MRN, date of birth, ID band Patient awake    Reviewed: Allergy & Precautions, NPO status , Patient's Chart, lab work & pertinent test results, reviewed documented beta blocker date and time   History of Anesthesia Complications Negative for: history of anesthetic complications  Airway Mallampati: II  TM Distance: >3 FB Neck ROM: Full    Dental  (+) Edentulous Upper, Edentulous Lower   Pulmonary former smoker,    Pulmonary exam normal        Cardiovascular hypertension, Pt. on medications and Pt. on home beta blockers + Peripheral Vascular Disease   Rhythm:Regular Rate:Bradycardia   '21 AAA - A moderate sized abdominal aortic aneurysm measuring 4.03 x 4.01 x 4.24 cm is seen in the mid aorta. Mild plaque noted in the proximal, mid and distal aorta.  Pt last seen by cardiologist, Dr. Adrian Prows, 12/29/2019.  Per OV note, "He has had a low risk nuclear stress test in February 2017 although the EKG portion was abnormal which are suggestive ischemia and he had normal exercise tolerance. Echocardiogram at that time was essentially unremarkable.  He has not had any chest discomfort, no dyspnea, no clinical evidence of heart failure.  Vascular examination except for prominent pulsatile abdominal mass which is nontender is unremarkable.  Abdominal aortic aneurysm size has remained stable.  I will see him back in 1 year."    Neuro/Psych  Headaches, negative psych ROS   GI/Hepatic Neg liver ROS, GERD  Controlled and Medicated,  Endo/Other   Obesity   Renal/GU negative Renal ROS Bladder dysfunction      Musculoskeletal  (+) Arthritis ,   Abdominal   Peds  Hematology  (+) Blood dyscrasia, Sickle cell trait and anemia ,  Plt 195k    Anesthesia Other Findings   Reproductive/Obstetrics                           Anesthesia Physical Anesthesia Plan  ASA:  III  Anesthesia Plan: Spinal   Post-op Pain Management:  [facility]ional for Post-op pain   Induction:   PONV Risk Score and Plan: 1 and Treatment may vary due to age or medical condition and Propofol infusion  Airway Management Planned: Natural Airway and Simple Face Mask  Additional Equipment: None  Intra-op Plan:   Post-operative Plan:   Informed Consent: I have reviewed the patients History and Physical, chart, labs and discussed the procedure including the risks, benefits and alternatives for the proposed anesthesia with the patient or authorized representative who has indicated his/her understanding and acceptance.       Plan Discussed with: CRNA and Anesthesiologist  Anesthesia Plan Comments: (Labs reviewed, platelets acceptable. Discussed risks and benefits of spinal, including spinal/epidural hematoma, infection, failed block, and PDPH. Patient expressed understanding and wished to proceed. )      Anesthesia Quick Evaluation

## 2020-04-18 ENCOUNTER — Ambulatory Visit (HOSPITAL_COMMUNITY): Payer: Medicare HMO | Admitting: Certified Registered Nurse Anesthetist

## 2020-04-18 ENCOUNTER — Encounter (HOSPITAL_COMMUNITY): Payer: Self-pay | Admitting: Orthopedic Surgery

## 2020-04-18 ENCOUNTER — Ambulatory Visit (HOSPITAL_COMMUNITY): Payer: Medicare HMO

## 2020-04-18 ENCOUNTER — Ambulatory Visit (HOSPITAL_COMMUNITY)
Admission: RE | Admit: 2020-04-18 | Discharge: 2020-04-19 | Disposition: A | Discharge status: Home / Self Care | Payer: Medicare HMO | Attending: Orthopedic Surgery | Admitting: Orthopedic Surgery

## 2020-04-18 ENCOUNTER — Encounter (HOSPITAL_COMMUNITY)
Admission: RE | Disposition: A | Discharge status: Home / Self Care | Payer: Self-pay | Source: Home / Self Care | Attending: Orthopedic Surgery

## 2020-04-18 ENCOUNTER — Ambulatory Visit (HOSPITAL_COMMUNITY): Payer: Medicare HMO | Admitting: Physician Assistant

## 2020-04-18 ENCOUNTER — Other Ambulatory Visit: Payer: Self-pay

## 2020-04-18 DIAGNOSIS — Z9079 Acquired absence of other genital organ(s): Secondary | ICD-10-CM | POA: Insufficient documentation

## 2020-04-18 DIAGNOSIS — Z96651 Presence of right artificial knee joint: Secondary | ICD-10-CM | POA: Diagnosis not present

## 2020-04-18 DIAGNOSIS — I739 Peripheral vascular disease, unspecified: Secondary | ICD-10-CM | POA: Insufficient documentation

## 2020-04-18 DIAGNOSIS — Z8546 Personal history of malignant neoplasm of prostate: Secondary | ICD-10-CM | POA: Insufficient documentation

## 2020-04-18 DIAGNOSIS — G8918 Other acute postprocedural pain: Secondary | ICD-10-CM | POA: Diagnosis not present

## 2020-04-18 DIAGNOSIS — E669 Obesity, unspecified: Secondary | ICD-10-CM | POA: Diagnosis not present

## 2020-04-18 DIAGNOSIS — Z7982 Long term (current) use of aspirin: Secondary | ICD-10-CM | POA: Diagnosis not present

## 2020-04-18 DIAGNOSIS — Z6830 Body mass index (BMI) 30.0-30.9, adult: Secondary | ICD-10-CM | POA: Insufficient documentation

## 2020-04-18 DIAGNOSIS — E7800 Pure hypercholesterolemia, unspecified: Secondary | ICD-10-CM | POA: Diagnosis not present

## 2020-04-18 DIAGNOSIS — K219 Gastro-esophageal reflux disease without esophagitis: Secondary | ICD-10-CM | POA: Insufficient documentation

## 2020-04-18 DIAGNOSIS — I700 Atherosclerosis of aorta: Secondary | ICD-10-CM | POA: Insufficient documentation

## 2020-04-18 DIAGNOSIS — M1711 Unilateral primary osteoarthritis, right knee: Secondary | ICD-10-CM | POA: Diagnosis not present

## 2020-04-18 DIAGNOSIS — Z87891 Personal history of nicotine dependence: Secondary | ICD-10-CM | POA: Diagnosis not present

## 2020-04-18 DIAGNOSIS — Z79899 Other long term (current) drug therapy: Secondary | ICD-10-CM | POA: Insufficient documentation

## 2020-04-18 DIAGNOSIS — E785 Hyperlipidemia, unspecified: Secondary | ICD-10-CM | POA: Diagnosis not present

## 2020-04-18 DIAGNOSIS — I714 Abdominal aortic aneurysm, without rupture: Secondary | ICD-10-CM | POA: Insufficient documentation

## 2020-04-18 DIAGNOSIS — D573 Sickle-cell trait: Secondary | ICD-10-CM | POA: Diagnosis not present

## 2020-04-18 DIAGNOSIS — I10 Essential (primary) hypertension: Secondary | ICD-10-CM | POA: Diagnosis not present

## 2020-04-18 DIAGNOSIS — Z471 Aftercare following joint replacement surgery: Secondary | ICD-10-CM | POA: Diagnosis not present

## 2020-04-18 DIAGNOSIS — M1990 Unspecified osteoarthritis, unspecified site: Secondary | ICD-10-CM | POA: Diagnosis not present

## 2020-04-18 HISTORY — PX: KNEE ARTHROPLASTY: SHX992

## 2020-04-18 LAB — TYPE AND SCREEN
ABO/RH(D): O POS
Antibody Screen: NEGATIVE

## 2020-04-18 SURGERY — ARTHROPLASTY, KNEE, TOTAL, USING IMAGELESS COMPUTER-ASSISTED NAVIGATION
Anesthesia: Spinal | Site: Knee | Laterality: Right

## 2020-04-18 MED ORDER — METHOCARBAMOL 500 MG PO TABS
500.0000 mg | ORAL_TABLET | Freq: Four times a day (QID) | ORAL | Status: DC | PRN
  Administered 2020-04-19: 500 mg via ORAL
  Filled 2020-04-18: qty 1

## 2020-04-18 MED ORDER — POVIDONE-IODINE 10 % EX SWAB: 2.0000 "application " | Freq: Once | CUTANEOUS | Status: DC

## 2020-04-18 MED ORDER — CELECOXIB 200 MG PO CAPS
200.0000 mg | ORAL_CAPSULE | Freq: Two times a day (BID) | ORAL | Status: DC
  Administered 2020-04-18 – 2020-04-19 (×2): 200 mg via ORAL
  Filled 2020-04-18 (×2): qty 1

## 2020-04-18 MED ORDER — SODIUM CHLORIDE 0.9 % IV SOLN: INTRAVENOUS | Status: DC

## 2020-04-18 MED ORDER — HYDROCODONE-ACETAMINOPHEN 7.5-325 MG PO TABS
1.0000 | ORAL_TABLET | ORAL | Status: DC | PRN
  Administered 2020-04-19: 1 via ORAL
  Filled 2020-04-18: qty 1

## 2020-04-18 MED ORDER — FENTANYL CITRATE (PF) 100 MCG/2ML IJ SOLN: 25.0000 ug | INTRAMUSCULAR | Status: DC | PRN

## 2020-04-18 MED ORDER — CEFAZOLIN SODIUM-DEXTROSE 2-4 GM/100ML-% IV SOLN
2.0000 g | Freq: Four times a day (QID) | INTRAVENOUS | Status: AC
  Administered 2020-04-18 (×2): 2 g via INTRAVENOUS
  Filled 2020-04-18 (×2): qty 100

## 2020-04-18 MED ORDER — ISOPROPYL ALCOHOL 70 % SOLN
Status: AC
  Filled 2020-04-18: qty 480

## 2020-04-18 MED ORDER — SODIUM CHLORIDE 0.9 % IR SOLN
Status: DC | PRN
  Administered 2020-04-18: 3000 mL

## 2020-04-18 MED ORDER — IRBESARTAN 150 MG PO TABS
300.0000 mg | ORAL_TABLET | Freq: Every day | ORAL | Status: DC
  Administered 2020-04-18 – 2020-04-19 (×2): 300 mg via ORAL
  Filled 2020-04-18 (×2): qty 2

## 2020-04-18 MED ORDER — PROPOFOL 1000 MG/100ML IV EMUL
INTRAVENOUS | Status: AC
  Filled 2020-04-18: qty 100

## 2020-04-18 MED ORDER — FENTANYL CITRATE (PF) 100 MCG/2ML IJ SOLN
INTRAMUSCULAR | Status: AC
  Filled 2020-04-18: qty 2

## 2020-04-18 MED ORDER — AMLODIPINE BESYLATE 10 MG PO TABS
10.0000 mg | ORAL_TABLET | Freq: Every day | ORAL | Status: DC
  Administered 2020-04-19: 10 mg via ORAL
  Filled 2020-04-18: qty 1

## 2020-04-18 MED ORDER — POVIDONE-IODINE 10 % EX SWAB
2.0000 "application " | Freq: Once | CUTANEOUS | Status: AC
  Administered 2020-04-18: 2 via TOPICAL

## 2020-04-18 MED ORDER — PANTOPRAZOLE SODIUM 40 MG PO TBEC
80.0000 mg | DELAYED_RELEASE_TABLET | Freq: Every day | ORAL | Status: DC
  Administered 2020-04-19: 80 mg via ORAL
  Filled 2020-04-18: qty 2

## 2020-04-18 MED ORDER — IRRISEPT - 450ML BOTTLE WITH 0.05% CHG IN STERILE WATER, USP 99.95% OPTIME
TOPICAL | Status: DC | PRN
  Administered 2020-04-18: 450 mL

## 2020-04-18 MED ORDER — PROPOFOL 10 MG/ML IV BOLUS
INTRAVENOUS | Status: AC
  Filled 2020-04-18: qty 20

## 2020-04-18 MED ORDER — KETOROLAC TROMETHAMINE 30 MG/ML IJ SOLN
INTRAMUSCULAR | Status: DC | PRN
  Administered 2020-04-18: 30 mg

## 2020-04-18 MED ORDER — METOCLOPRAMIDE HCL 5 MG/ML IJ SOLN: 5.0000 mg | Freq: Three times a day (TID) | INTRAMUSCULAR | Status: DC | PRN

## 2020-04-18 MED ORDER — TRANEXAMIC ACID-NACL 1000-0.7 MG/100ML-% IV SOLN
1000.0000 mg | INTRAVENOUS | Status: AC
  Administered 2020-04-18: 1000 mg via INTRAVENOUS
  Filled 2020-04-18: qty 100

## 2020-04-18 MED ORDER — CEFAZOLIN SODIUM-DEXTROSE 2-4 GM/100ML-% IV SOLN
2.0000 g | INTRAVENOUS | Status: AC
  Administered 2020-04-18: 2 g via INTRAVENOUS
  Filled 2020-04-18: qty 100

## 2020-04-18 MED ORDER — SODIUM CHLORIDE (PF) 0.9 % IJ SOLN
INTRAMUSCULAR | Status: AC
  Filled 2020-04-18: qty 50

## 2020-04-18 MED ORDER — MENTHOL 3 MG MT LOZG: 1.0000 | LOZENGE | OROMUCOSAL | Status: DC | PRN

## 2020-04-18 MED ORDER — 0.9 % SODIUM CHLORIDE (POUR BTL) OPTIME
TOPICAL | Status: DC | PRN
  Administered 2020-04-18: 1000 mL

## 2020-04-18 MED ORDER — DEXAMETHASONE SODIUM PHOSPHATE 10 MG/ML IJ SOLN
INTRAMUSCULAR | Status: AC
  Filled 2020-04-18: qty 1

## 2020-04-18 MED ORDER — PROPOFOL 10 MG/ML IV BOLUS
INTRAVENOUS | Status: DC | PRN
  Administered 2020-04-18: 30 mg via INTRAVENOUS

## 2020-04-18 MED ORDER — ONDANSETRON HCL 4 MG/2ML IJ SOLN
INTRAMUSCULAR | Status: AC
  Filled 2020-04-18: qty 2

## 2020-04-18 MED ORDER — ROPIVACAINE HCL 7.5 MG/ML IJ SOLN
INTRAMUSCULAR | Status: DC | PRN
Start: 2020-04-18 — End: 2020-04-18
  Administered 2020-04-18: 20 mL via PERINEURAL

## 2020-04-18 MED ORDER — MORPHINE SULFATE (PF) 2 MG/ML IV SOLN: 0.5000 mg | INTRAVENOUS | Status: DC | PRN

## 2020-04-18 MED ORDER — ONDANSETRON HCL 4 MG/2ML IJ SOLN: 4.0000 mg | Freq: Four times a day (QID) | INTRAMUSCULAR | Status: DC | PRN

## 2020-04-18 MED ORDER — SENNA 8.6 MG PO TABS
1.0000 | ORAL_TABLET | Freq: Two times a day (BID) | ORAL | Status: DC
  Administered 2020-04-18 – 2020-04-19 (×2): 8.6 mg via ORAL
  Filled 2020-04-18 (×2): qty 1

## 2020-04-18 MED ORDER — KETOROLAC TROMETHAMINE 30 MG/ML IJ SOLN
INTRAMUSCULAR | Status: AC
  Filled 2020-04-18: qty 1

## 2020-04-18 MED ORDER — ONDANSETRON HCL 4 MG PO TABS: 4.0000 mg | ORAL_TABLET | Freq: Four times a day (QID) | ORAL | Status: DC | PRN

## 2020-04-18 MED ORDER — ONDANSETRON HCL 4 MG/2ML IJ SOLN: 4.0000 mg | Freq: Once | INTRAMUSCULAR | Status: DC | PRN

## 2020-04-18 MED ORDER — METHOCARBAMOL 500 MG IVPB - SIMPLE MED
500.0000 mg | Freq: Four times a day (QID) | INTRAVENOUS | Status: DC | PRN
  Filled 2020-04-18: qty 50

## 2020-04-18 MED ORDER — STERILE WATER FOR IRRIGATION IR SOLN
Status: DC | PRN
  Administered 2020-04-18: 1000 mL

## 2020-04-18 MED ORDER — DIPHENHYDRAMINE HCL 12.5 MG/5ML PO ELIX: 12.5000 mg | ORAL_SOLUTION | ORAL | Status: DC | PRN

## 2020-04-18 MED ORDER — OXYCODONE HCL 5 MG/5ML PO SOLN: 5.0000 mg | Freq: Once | ORAL | Status: DC | PRN

## 2020-04-18 MED ORDER — POLYETHYLENE GLYCOL 3350 17 G PO PACK: 17.0000 g | PACK | Freq: Every day | ORAL | Status: DC | PRN

## 2020-04-18 MED ORDER — DEXAMETHASONE SODIUM PHOSPHATE 10 MG/ML IJ SOLN
10.0000 mg | Freq: Once | INTRAMUSCULAR | Status: AC
  Administered 2020-04-19: 10 mg via INTRAVENOUS
  Filled 2020-04-18: qty 1

## 2020-04-18 MED ORDER — FENTANYL CITRATE (PF) 100 MCG/2ML IJ SOLN
INTRAMUSCULAR | Status: DC | PRN
  Administered 2020-04-18: 50 ug via INTRAVENOUS

## 2020-04-18 MED ORDER — EPHEDRINE 5 MG/ML INJ
INTRAVENOUS | Status: AC
  Filled 2020-04-18: qty 10

## 2020-04-18 MED ORDER — LACTATED RINGERS IV SOLN: INTRAVENOUS | Status: DC

## 2020-04-18 MED ORDER — BUPIVACAINE-EPINEPHRINE (PF) 0.25% -1:200000 IJ SOLN
INTRAMUSCULAR | Status: AC
  Filled 2020-04-18: qty 30

## 2020-04-18 MED ORDER — BUPIVACAINE-EPINEPHRINE 0.25% -1:200000 IJ SOLN
INTRAMUSCULAR | Status: DC | PRN
  Administered 2020-04-18: 30 mL

## 2020-04-18 MED ORDER — ALUM & MAG HYDROXIDE-SIMETH 200-200-20 MG/5ML PO SUSP: 30.0000 mL | ORAL | Status: DC | PRN

## 2020-04-18 MED ORDER — PHENYLEPHRINE HCL (PRESSORS) 10 MG/ML IV SOLN
INTRAVENOUS | Status: AC
  Filled 2020-04-18: qty 1

## 2020-04-18 MED ORDER — ONDANSETRON HCL 4 MG/2ML IJ SOLN
INTRAMUSCULAR | Status: DC | PRN
  Administered 2020-04-18: 4 mg via INTRAVENOUS

## 2020-04-18 MED ORDER — MIDAZOLAM HCL 2 MG/2ML IJ SOLN
INTRAMUSCULAR | Status: AC
  Filled 2020-04-18: qty 2

## 2020-04-18 MED ORDER — PHENOL 1.4 % MT LIQD: 1.0000 | OROMUCOSAL | Status: DC | PRN

## 2020-04-18 MED ORDER — SODIUM CHLORIDE 0.9 % IR SOLN
Status: DC | PRN
  Administered 2020-04-18: 1000 mL

## 2020-04-18 MED ORDER — PHENYLEPHRINE HCL-NACL 10-0.9 MG/250ML-% IV SOLN
INTRAVENOUS | Status: DC | PRN
Start: 2020-04-18 — End: 2020-04-18
  Administered 2020-04-18: 25 ug/min via INTRAVENOUS

## 2020-04-18 MED ORDER — ATORVASTATIN CALCIUM 20 MG PO TABS
20.0000 mg | ORAL_TABLET | Freq: Every day | ORAL | Status: DC
  Administered 2020-04-19: 20 mg via ORAL
  Filled 2020-04-18: qty 1

## 2020-04-18 MED ORDER — ACETAMINOPHEN 325 MG PO TABS: 325.0000 mg | ORAL_TABLET | Freq: Four times a day (QID) | ORAL | Status: DC | PRN

## 2020-04-18 MED ORDER — METOPROLOL TARTRATE 25 MG PO TABS
25.0000 mg | ORAL_TABLET | Freq: Two times a day (BID) | ORAL | Status: DC
  Administered 2020-04-18 – 2020-04-19 (×2): 25 mg via ORAL
  Filled 2020-04-18 (×2): qty 1

## 2020-04-18 MED ORDER — MIDAZOLAM HCL 2 MG/2ML IJ SOLN
INTRAMUSCULAR | Status: DC | PRN
  Administered 2020-04-18: 2 mg via INTRAVENOUS

## 2020-04-18 MED ORDER — HYDROCODONE-ACETAMINOPHEN 5-325 MG PO TABS
1.0000 | ORAL_TABLET | ORAL | Status: DC | PRN
  Administered 2020-04-18 – 2020-04-19 (×4): 2 via ORAL
  Filled 2020-04-18 (×4): qty 2

## 2020-04-18 MED ORDER — SODIUM CHLORIDE (PF) 0.9 % IJ SOLN
INTRAMUSCULAR | Status: DC | PRN
  Administered 2020-04-18: 50 mL

## 2020-04-18 MED ORDER — EPHEDRINE SULFATE-NACL 50-0.9 MG/10ML-% IV SOSY
PREFILLED_SYRINGE | INTRAVENOUS | Status: DC | PRN
  Administered 2020-04-18: 10 mg via INTRAVENOUS

## 2020-04-18 MED ORDER — METOCLOPRAMIDE HCL 5 MG PO TABS: 5.0000 mg | ORAL_TABLET | Freq: Three times a day (TID) | ORAL | Status: DC | PRN

## 2020-04-18 MED ORDER — PROPOFOL 500 MG/50ML IV EMUL
INTRAVENOUS | Status: DC | PRN
  Administered 2020-04-18: 115 ug/kg/min via INTRAVENOUS

## 2020-04-18 MED ORDER — OXYCODONE HCL 5 MG PO TABS: 5.0000 mg | ORAL_TABLET | Freq: Once | ORAL | Status: DC | PRN

## 2020-04-18 MED ORDER — BUPIVACAINE IN DEXTROSE 0.75-8.25 % IT SOLN
INTRATHECAL | Status: DC | PRN
Start: 2020-04-18 — End: 2020-04-18
  Administered 2020-04-18: 1.8 mL via INTRATHECAL

## 2020-04-18 MED ORDER — ISOPROPYL ALCOHOL 70 % SOLN
Status: DC | PRN
  Administered 2020-04-18: 1 via TOPICAL

## 2020-04-18 MED ORDER — DEXAMETHASONE SODIUM PHOSPHATE 10 MG/ML IJ SOLN
INTRAMUSCULAR | Status: DC | PRN
  Administered 2020-04-18: 8 mg via INTRAVENOUS

## 2020-04-18 MED ORDER — DOCUSATE SODIUM 100 MG PO CAPS
100.0000 mg | ORAL_CAPSULE | Freq: Two times a day (BID) | ORAL | Status: DC
  Administered 2020-04-18 – 2020-04-19 (×2): 100 mg via ORAL
  Filled 2020-04-18 (×2): qty 1

## 2020-04-18 MED ORDER — APIXABAN 2.5 MG PO TABS
2.5000 mg | ORAL_TABLET | Freq: Two times a day (BID) | ORAL | Status: DC
  Administered 2020-04-19: 2.5 mg via ORAL
  Filled 2020-04-18: qty 1

## 2020-04-18 SURGICAL SUPPLY — 74 items
ADH SKN CLS APL DERMABOND .7 (GAUZE/BANDAGES/DRESSINGS) ×2
APL PRP STRL LF DISP 70% ISPRP (MISCELLANEOUS) ×2
BAG SPEC THK2 15X12 ZIP CLS (MISCELLANEOUS)
BAG ZIPLOCK 12X15 (MISCELLANEOUS) IMPLANT
BATTERY INSTRU NAVIGATION (MISCELLANEOUS) ×6 IMPLANT
BLADE SAW RECIPROCATING 77.5 (BLADE) ×2 IMPLANT
BNDG ELASTIC 4X5.8 VLCR STR LF (GAUZE/BANDAGES/DRESSINGS) ×2 IMPLANT
BNDG ELASTIC 6X5.8 VLCR STR LF (GAUZE/BANDAGES/DRESSINGS) ×2 IMPLANT
BSPLAT TIB 6 KN TRITANIUM (Knees) ×1 IMPLANT
BTRY SRG DRVR LF (MISCELLANEOUS) ×3
CHLORAPREP W/TINT 26 (MISCELLANEOUS) ×4 IMPLANT
COMPONENT TRI CR RETAIN SZ6 RT (Miscellaneous) IMPLANT
COVER SURGICAL LIGHT HANDLE (MISCELLANEOUS) ×2 IMPLANT
COVER WAND RF STERILE (DRAPES) IMPLANT
CUFF TOURN SGL QUICK 34 (TOURNIQUET CUFF) ×2
CUFF TRNQT CYL 34X4.125X (TOURNIQUET CUFF) ×1 IMPLANT
DECANTER SPIKE VIAL GLASS SM (MISCELLANEOUS) ×4 IMPLANT
DERMABOND ADVANCED (GAUZE/BANDAGES/DRESSINGS) ×2
DERMABOND ADVANCED .7 DNX12 (GAUZE/BANDAGES/DRESSINGS) ×2 IMPLANT
DRAPE SHEET LG 3/4 BI-LAMINATE (DRAPES) ×6 IMPLANT
DRAPE U-SHAPE 47X51 STRL (DRAPES) ×2 IMPLANT
DRESSING AQUACEL AG SP 3.5X10 (GAUZE/BANDAGES/DRESSINGS) IMPLANT
DRSG AQUACEL AG ADV 3.5X10 (GAUZE/BANDAGES/DRESSINGS) ×2 IMPLANT
DRSG AQUACEL AG SP 3.5X10 (GAUZE/BANDAGES/DRESSINGS) ×2
DRSG TEGADERM 4X4.75 (GAUZE/BANDAGES/DRESSINGS) IMPLANT
ELECT BLADE TIP CTD 4 INCH (ELECTRODE) ×2 IMPLANT
ELECT REM PT RETURN 15FT ADLT (MISCELLANEOUS) ×2 IMPLANT
EVACUATOR 1/8 PVC DRAIN (DRAIN) IMPLANT
GAUZE SPONGE 4X4 12PLY STRL (GAUZE/BANDAGES/DRESSINGS) ×2 IMPLANT
GLOVE BIO SURGEON STRL SZ8.5 (GLOVE) ×4 IMPLANT
GLOVE BIOGEL PI IND STRL 8.5 (GLOVE) ×1 IMPLANT
GLOVE BIOGEL PI INDICATOR 8.5 (GLOVE) ×1
GOWN SPEC L3 XXLG W/TWL (GOWN DISPOSABLE) ×2 IMPLANT
HANDPIECE INTERPULSE COAX TIP (DISPOSABLE) ×2
HOLDER FOLEY CATH W/STRAP (MISCELLANEOUS) ×2 IMPLANT
HOOD PEEL AWAY FLYTE STAYCOOL (MISCELLANEOUS) ×6 IMPLANT
INSERT KNEE TIB BRG SZ 6 (Insert) ×1 IMPLANT
JET LAVAGE IRRISEPT WOUND (IRRIGATION / IRRIGATOR) ×2
KIT TURNOVER KIT A (KITS) IMPLANT
KNEE TIBIAL COMPONENT SZ6 (Knees) ×1 IMPLANT
LAVAGE JET IRRISEPT WOUND (IRRIGATION / IRRIGATOR) ×1 IMPLANT
MARKER SKIN DUAL TIP RULER LAB (MISCELLANEOUS) ×2 IMPLANT
NDL SAFETY ECLIPSE 18X1.5 (NEEDLE) ×1 IMPLANT
NDL SPNL 18GX3.5 QUINCKE PK (NEEDLE) ×1 IMPLANT
NEEDLE HYPO 18GX1.5 SHARP (NEEDLE) ×2
NEEDLE SPNL 18GX3.5 QUINCKE PK (NEEDLE) ×2 IMPLANT
NS IRRIG 1000ML POUR BTL (IV SOLUTION) ×2 IMPLANT
PACK TOTAL KNEE CUSTOM (KITS) ×2 IMPLANT
PADDING CAST COTTON 6X4 STRL (CAST SUPPLIES) ×2 IMPLANT
PATELLA ASYMMETRIC 38X11 KNEE (Orthopedic Implant) ×1 IMPLANT
PENCIL SMOKE EVACUATOR (MISCELLANEOUS) IMPLANT
PROTECTOR NERVE ULNAR (MISCELLANEOUS) ×2 IMPLANT
SAW OSC TIP CART 19.5X105X1.3 (SAW) ×2 IMPLANT
SEALER BIPOLAR AQUA 6.0 (INSTRUMENTS) ×2 IMPLANT
SET HNDPC FAN SPRY TIP SCT (DISPOSABLE) ×1 IMPLANT
SET PAD KNEE POSITIONER (MISCELLANEOUS) ×2 IMPLANT
SPONGE DRAIN TRACH 4X4 STRL 2S (GAUZE/BANDAGES/DRESSINGS) IMPLANT
SUT MNCRL AB 3-0 PS2 18 (SUTURE) ×2 IMPLANT
SUT MNCRL AB 4-0 PS2 18 (SUTURE) ×2 IMPLANT
SUT MON AB 2-0 CT1 36 (SUTURE) ×2 IMPLANT
SUT STRATAFIX PDO 1 14 VIOLET (SUTURE) ×2
SUT STRATFX PDO 1 14 VIOLET (SUTURE) ×1
SUT VIC AB 1 CTX 36 (SUTURE) ×4
SUT VIC AB 1 CTX36XBRD ANBCTR (SUTURE) ×2 IMPLANT
SUT VIC AB 2-0 CT1 27 (SUTURE) ×2
SUT VIC AB 2-0 CT1 TAPERPNT 27 (SUTURE) ×1 IMPLANT
SUTURE STRATFX PDO 1 14 VIOLET (SUTURE) ×1 IMPLANT
SYR 3ML LL SCALE MARK (SYRINGE) ×2 IMPLANT
TOWER CARTRIDGE SMART MIX (DISPOSABLE) IMPLANT
TRAY FOLEY MTR SLVR 16FR STAT (SET/KITS/TRAYS/PACK) IMPLANT
TRIATHLON CRUCIATE RETAIN SZ6 (Miscellaneous) ×2 IMPLANT
WATER STERILE IRR 1000ML POUR (IV SOLUTION) ×4 IMPLANT
WRAP KNEE MAXI GEL POST OP (GAUZE/BANDAGES/DRESSINGS) ×2 IMPLANT
YANKAUER SUCT BULB TIP 10FT TU (MISCELLANEOUS) ×2 IMPLANT

## 2020-04-18 NOTE — Anesthesia Postprocedure Evaluation (Signed)
Anesthesia Post Note  Patient: Kevin Pacheco.  Procedure(s) Performed: COMPUTER ASSISTED TOTAL KNEE ARTHROPLASTY (Right Knee)     Patient location during evaluation: PACU Anesthesia Type: Spinal Level of consciousness: awake and alert Pain management: pain level controlled Vital Signs Assessment: post-procedure vital signs reviewed and stable Respiratory status: spontaneous breathing and respiratory function stable Cardiovascular status: blood pressure returned to baseline and stable Postop Assessment: spinal receding and no apparent nausea or vomiting Anesthetic complications: no    Last Vitals:  Vitals:   04/18/20 1115 04/18/20 1127  BP: 118/81 117/69  Pulse: (!) 51 (!) 50  Resp: 19 15  Temp:    SpO2: 100%     Last Pain:  Vitals:   04/18/20 1115  TempSrc:   PainSc: 0-No pain                 Audry Pili

## 2020-04-18 NOTE — Transfer of Care (Signed)
Immediate Anesthesia Transfer of Care Note  Patient: Baldeep Mattheis.  Procedure(s) Performed: COMPUTER ASSISTED TOTAL KNEE ARTHROPLASTY (Right Knee)  Patient Location: PACU  Anesthesia Type:Spinal  Level of Consciousness: awake, alert  and oriented  Airway & Oxygen Therapy: Patient Spontanous Breathing and Patient connected to face mask oxygen  Post-op Assessment: Report given to RN and Post -op Vital signs reviewed and stable  Post vital signs: Reviewed and stable  Last Vitals:  Vitals Value Taken Time  BP 132/114 04/18/20 1017  Temp    Pulse 128 04/18/20 1019  Resp 21 04/18/20 1019  SpO2 96 % 04/18/20 1019  Vitals shown include unvalidated device data.  Last Pain:  Vitals:   04/18/20 0606  TempSrc: Oral  PainSc:          Complications: No apparent anesthesia complications

## 2020-04-18 NOTE — Anesthesia Procedure Notes (Signed)
Spinal  Patient location during procedure: OR Start time: 04/18/2020 7:30 AM End time: 04/18/2020 7:37 AM Staffing Performed: anesthesiologist  Anesthesiologist: Audry Pili, MD Preanesthetic Checklist Completed: patient identified, IV checked, risks and benefits discussed, surgical consent, monitors and equipment checked, pre-op evaluation and timeout performed Spinal Block Patient position: sitting Prep: DuraPrep Patient monitoring: heart rate, cardiac monitor, continuous pulse ox and blood pressure Approach: midline Location: L3-4 Injection technique: single-shot Needle Needle type: Quincke  Needle gauge: 22 G Additional Notes Consent was obtained prior to the procedure with all questions answered and concerns addressed. Risks including, but not limited to, bleeding, infection, nerve damage, paralysis, failed block, inadequate analgesia, allergic reaction, high spinal, itching, and headache were discussed and the patient wished to proceed. Functioning IV was confirmed and monitors were applied. Sterile prep and drape, including hand hygiene, mask, and sterile gloves were used. The patient was positioned and the spine was prepped. The skin was anesthetized with lidocaine. First attempt by CRNA with 24ga Pencan unsuccessful. Dr. Fransisco Beau successful with 22ga Quincke. Free flow of clear CSF was obtained prior to injecting local anesthetic into the CSF. The spinal needle aspirated freely following injection. The needle was carefully withdrawn. The patient tolerated the procedure well.   Renold Don, MD

## 2020-04-18 NOTE — Progress Notes (Addendum)
Physical Therapy Evaluation Patient Details Name: Kevin Pacheco. MRN: [id number] DOB: Feb 03, 1941 Today's Date: 04/18/2020   History of Present Illness  Pt is an 79 year old male s/p R TKA with hx of L TKA and L TKA revision  Clinical Impression  Pt is s/p TKA resulting in the deficits listed below (see PT Problem List).  Pt will benefit from skilled PT to increase their independence and safety with mobility to allow discharge to the venue listed below.  Pt assisted with ambulating POD #0.  Pt anticipates d/c home tomorrow and would like to be discharged by 3 pm, so his spouse can make a 4pm appointment.  Anticipate good progress if pain remains controlled.     Follow Up Recommendations Follow surgeon's recommendation for DC plan and follow-up therapies    Equipment Recommendations  Rolling walker with 5" wheels    Recommendations for Other Services       Precautions / Restrictions Precautions Precautions: Fall;Knee Restrictions Weight Bearing Restrictions: No      Mobility  Bed Mobility Overal bed mobility: Needs Assistance Bed Mobility: Supine to Sit     Supine to sit: Min guard     General bed mobility comments: min/guard for lines and sheets, pt eager to mobilize  Transfers Overall transfer level: Needs assistance Equipment used: Rolling walker (2 wheeled) Transfers: Sit to/from Stand Sit to Stand: Min guard         General transfer comment: verbal cues for UE and LE positioning  Ambulation/Gait Ambulation/Gait assistance: Min guard Gait Distance (Feet): 80 Feet Assistive device: Rolling walker (2 wheeled) Gait Pattern/deviations: Step-to pattern;Decreased stance time - right;Antalgic     General Gait Details: verbal cues for sequence, RW positioning, step length  Stairs            Wheelchair Mobility    Modified Rankin (Stroke Patients Only)       Balance                                             Pertinent  Vitals/Pain Pain Assessment: No/denies pain    Home Living Family/patient expects to be discharged to:: Private residence Living Arrangements: Spouse/significant other Available Help at Discharge: Family Type of Home: House Home Access: Stairs to enter Entrance Stairs-Rails: None Technical brewer of Steps: 1+1 Home Layout: One level Home Equipment: None      Prior Function Level of Independence: Independent               Hand Dominance        Extremity/Trunk Assessment        Lower Extremity Assessment Lower Extremity Assessment: RLE deficits/detail RLE Deficits / Details: able to perform SLR, observed 60* AAROM knee flexoin functionally       Communication   Communication: No difficulties  Cognition Arousal/Alertness: Awake/alert Behavior During Therapy: WFL for tasks assessed/performed Overall Cognitive Status: Within Functional Limits for tasks assessed                                        General Comments      Exercises     Assessment/Plan    PT Assessment Patient needs continued PT services  PT Problem List Decreased strength;Decreased mobility;Decreased activity tolerance;Pain;Decreased knowledge of use of DME;Decreased knowledge of precautions;Decreased  range of motion       PT Treatment Interventions DME instruction;Therapeutic activities;Gait training;Therapeutic exercise;Patient/family education;Functional mobility training;Stair training    PT Goals (Current goals can be found in the Care Plan section)  Acute Rehab PT Goals PT Goal Formulation: With patient Time For Goal Achievement: 04/25/20 Potential to Achieve Goals: Good    Frequency 7X/week   Barriers to discharge        Co-evaluation               AM-PAC PT "6 Clicks" Mobility  Outcome Measure Help needed turning from your back to your side while in a flat bed without using bedrails?: A Little Help needed moving from lying on your back to  sitting on the side of a flat bed without using bedrails?: A Little Help needed moving to and from a bed to a chair (including a wheelchair)?: A Little Help needed standing up from a chair using your arms (e.g., wheelchair or bedside chair)?: A Little Help needed to walk in hospital room?: A Little Help needed climbing 3-5 steps with a railing? : A Little 6 Click Score: 18    End of Session Equipment Utilized During Treatment: Gait belt Activity Tolerance: Patient tolerated treatment well Patient left: in chair;with call bell/phone within reach;with chair alarm set Nurse Communication: Mobility status PT Visit Diagnosis: Other abnormalities of gait and mobility (R26.89)    Time: [id number] PT Time Calculation (min) (ACUTE ONLY): 20 min   Charges:   PT Evaluation $PT Eval Low Complexity: 1 Low         Kati PT, DPT Acute Rehabilitation Services Office: [phone]   Trena Platt 04/18/2020, 3:31 PM

## 2020-04-18 NOTE — Anesthesia Procedure Notes (Signed)
Anesthesia Regional Block: Adductor canal block   Pre-Anesthetic Checklist: ,, timeout performed, Correct Patient, Correct Site, Correct Laterality, Correct Procedure, Correct Position, site marked, Risks and benefits discussed,  Surgical consent,  Pre-op evaluation,  At surgeon's request and post-op pain management  Laterality: Left  Prep: chloraprep       Needles:  Injection technique: Single-shot  Needle Type: Echogenic Needle     Needle Length: 10cm  Needle Gauge: 21     Additional Needles:   Narrative:  Start time: 04/18/2020 6:48 AM End time: 04/18/2020 6:52 AM Injection made incrementally with aspirations every 5 mL.  Performed by: Personally  Anesthesiologist: Audry Pili, MD  Additional Notes: No pain on injection. No increased resistance to injection. Injection made in 5cc increments. Good needle visualization. Patient tolerated the procedure well.

## 2020-04-18 NOTE — Interval H&P Note (Signed)
History and Physical Interval Note:  04/18/2020 7:18 AM  Kevin Pacheco.  has presented today for surgery, with the diagnosis of degenerative joint disease right knee.  The various methods of treatment have been discussed with the patient and family. After consideration of risks, benefits and other options for treatment, the patient has consented to  Procedure(s): COMPUTER ASSISTED TOTAL KNEE ARTHROPLASTY (Right) as a surgical intervention.  The patient's history has been reviewed, patient examined, no change in status, stable for surgery.  I have reviewed the patient's chart and labs.  Questions were answered to the patient's satisfaction.     Hilton Cork Andreia Gandolfi

## 2020-04-18 NOTE — Care Plan (Signed)
Ortho Bundle Case Management Note  Patient Details  Name: Kevin Pacheco. MRN: [id number] Date of Birth: 1941-09-28  R TKA on 04-18-20 DCP:  Home with wife.  1 story home with 2 ste.  DME:  RW ordered through [facility].  Has elevated toilets and doesn't want a 3-in-1. PT:  [facility].  PT eval scheduled on 04-24-20.                   DME Arranged:  Gilford Rile rolling DME Agency:  [facility]  HH Arranged:  NA [place] Agency:  NA  Additional Comments: Please contact me with any questions of if this plan should need to change.  Marianne Sofia, RN,CCM [facility]  [phone] 04/18/2020, 8:15 AM

## 2020-04-18 NOTE — Op Note (Signed)
OPERATIVE REPORT  SURGEON: Rod Can, MD   ASSISTANT: Nehemiah Massed, PA-C  PREOPERATIVE DIAGNOSIS: Right knee arthritis.   POSTOPERATIVE DIAGNOSIS: Right knee arthritis.   PROCEDURE: Right total knee arthroplasty.   IMPLANTS: Stryker Triathlon CR femur, size 6. Stryker Tritanium tibia, size 6. X3 polyethelyene insert, size 9 mm, CR. 3 button asymmetric patella, size 38 mm.  ANESTHESIA:  MAC, Regional and Spinal  TOURNIQUET TIME: Not utilized.   ESTIMATED BLOOD LOSS:-200 mL    ANTIBIOTICS: 2 g Ancef.  DRAINS: None.  COMPLICATIONS: None   CONDITION: PACU - hemodynamically stable.   BRIEF CLINICAL NOTE: Kevin Pacheco. is a 79 y.o. male with a long-standing history of Right knee arthritis. After failing conservative management, the patient was indicated for total knee arthroplasty. The risks, benefits, and alternatives to the procedure were explained, and the patient elected to proceed.  PROCEDURE IN DETAIL: Adductor canal block was obtained in the pre-op holding area. Once inside the operative room, spinal anesthesia was obtained, and a foley catheter was inserted. The patient was then positioned, a nonsterile tourniquet was placed, and the lower extremity was prepped and draped in the normal sterile surgical fashion.  A time-out was called verifying side and site of surgery. The patient received IV antibiotics within 60 minutes of beginning the procedure. The tourniquet was not utilized.   An anterior approach to the knee was performed utilizing a midvastus arthrotomy. A medial release was performed and the patellar fat pad was excised. Stryker navigation was used to cut the distal femur perpendicular to the mechanical axis. A freehand patellar resection was performed, and the patella was sized an prepared with 3 lug holes.  Nagivation was used to make a neutral  proximal tibia resection, taking 9 mm of bone from the less affected lateral side with 3 degrees of slope. The menisci were excised. A spacer block was placed, and the alignment and balance in extension were confirmed.   The distal femur was sized using the 3-degree external rotation guide referencing the posterior femoral cortex. The appropriate 4-in-1 cutting block was pinned into place. Rotation was checked using Whiteside's line, the epicondylar axis, and then confirmed with a spacer block in flexion. The remaining femoral cuts were performed, taking care to protect the MCL.  The tibia was sized and the trial tray was pinned into place. The remaining trail components were inserted. The knee was stable to varus and valgus stress through a full range of motion. The patella tracked centrally, and the PCL was well balanced. The trial components were removed, and the proximal tibial surface was prepared. Final components were impacted into place. The knee was tested for a final time and found to be well balanced.   The wound was copiously irrigated with Irrisept solution and normal saline using pule lavage.  Marcaine solution was injected into the periarticular soft tissue.  The wound was closed in layers using #1 Vicryl and Stratafix for the fascia, 2-0 Vicryl for the subcutaneous fat, 2-0 Monocryl for the deep dermal layer, 3-0 running Monocryl subcuticular Stitch, and 4-0 Monocryl stay sutures at both ends of the wound. Dermabond was applied to the skin.  Once the glue was fully dried, an Aquacell Ag and compressive dressing were applied.  Tthe patient was transported to the recovery room in stable condition.  Sponge, needle, and instrument counts were correct at the end of the case x2.  The patient tolerated the procedure well and there were no known complications.  Please note that  a surgical assistant was a medical necessity for this procedure in order to perform it in a safe and expeditious manner.  Surgical assistant was necessary to retract the ligaments and vital neurovascular structures to prevent injury to them and also necessary for proper positioning of the limb to allow for anatomic placement of the prosthesis.

## 2020-04-18 NOTE — Anesthesia Procedure Notes (Signed)
Procedure Name: MAC Date/Time: 04/18/2020 7:27 AM Performed by: Niel Hummer, CRNA Pre-anesthesia Checklist: Patient identified, Emergency Drugs available, Suction available and Patient being monitored Oxygen Delivery Method: Simple face mask

## 2020-04-18 NOTE — Discharge Instructions (Signed)
° °Dr. Brian Swinteck °Total Joint Specialist °[facility] °[address] °[place], [place] [address] °[phone] ° °TOTAL KNEE REPLACEMENT POSTOPERATIVE DIRECTIONS ° ° ° °Knee Rehabilitation, Guidelines Following Surgery  °Results after knee surgery are often greatly improved when you follow the exercise, range of motion and muscle strengthening exercises prescribed by your doctor. Safety measures are also important to protect the knee from further injury. Any time any of these exercises cause you to have increased pain or swelling in your knee joint, decrease the amount until you are comfortable again and slowly increase them. If you have problems or questions, call your caregiver or physical therapist for advice.  ° °WEIGHT BEARING °Weight bearing as tolerated with assist device (walker, cane, etc) as directed, use it as long as suggested by your surgeon or therapist, typically at least 4-6 weeks. ° °HOME CARE INSTRUCTIONS  °Remove items at home which could result in a fall. This includes throw rugs or furniture in walking pathways.  °Continue medications as instructed at time of discharge. °You may have some home medications which will be placed on hold until you complete the course of blood thinner medication.  °You may start showering once you are discharged home but do not submerge the incision under water. Just pat the incision dry and apply a dry gauze dressing on daily. °Walk with walker as instructed.  °You may resume a sexual relationship in one month or when given the OK by your doctor.  °· Use walker as long as suggested by your caregivers. °· Avoid periods of inactivity such as sitting longer than an hour when not asleep. This helps prevent blood clots.  °You may put full weight on your legs and walk as much as is comfortable.  °You may return to work once you are cleared by your doctor.  °Do not drive a car for 6 weeks or until released by you surgeon.  °· Do not drive  while taking narcotics.  °Wear the elastic stockings for three weeks following surgery during the day but you may remove then at night. °Make sure you keep all of your appointments after your operation with all of your doctors and caregivers. You should call the office at the above phone number and make an appointment for approximately two weeks after the date of your surgery. °Do not remove your surgical dressing. The dressing is waterproof; you may take showers in 3 days, but do not take tub baths or submerge the dressing. °Please pick up a stool softener and laxative for home use as long as you are requiring pain medications. °· ICE to the affected knee every three hours for 30 minutes at a time and then as needed for pain and swelling.  Continue to use ice on the knee for pain and swelling from surgery. You may notice swelling that will progress down to the foot and ankle.  This is normal after surgery.  Elevate the leg when you are not up walking on it.   °It is important for you to complete the blood thinner medication as prescribed by your doctor. °· Continue to use the breathing machine which will help keep your temperature down.  It is common for your temperature to cycle up and down following surgery, especially at night when you are not up moving around and exerting yourself.  The breathing machine keeps your lungs expanded and your temperature down. ° °RANGE OF MOTION AND STRENGTHENING EXERCISES  °Rehabilitation of the knee is important following   a knee injury or an operation. After just a few days of immobilization, the muscles of the thigh which control the knee become weakened and shrink (atrophy). Knee exercises are designed to build up the tone and strength of the thigh muscles and to improve knee motion. Often times heat used for twenty to thirty minutes before working out will loosen up your tissues and help with improving the range of motion but do not use heat for the first two weeks following  surgery. These exercises can be done on a training (exercise) mat, on the floor, on a table or on a bed. Use what ever works the best and is most comfortable for you Knee exercises include:  °Leg Lifts - While your knee is still immobilized in a splint or cast, you can do straight leg raises. Lift the leg to 60 degrees, hold for 3 sec, and slowly lower the leg. Repeat 10-20 times 2-3 times daily. Perform this exercise against resistance later as your knee gets better.  °Quad and Hamstring Sets - Tighten up the muscle on the front of the thigh (Quad) and hold for 5-10 sec. Repeat this 10-20 times hourly. Hamstring sets are done by pushing the foot backward against an object and holding for 5-10 sec. Repeat as with quad sets.  °A rehabilitation program following serious knee injuries can speed recovery and prevent re-injury in the future due to weakened muscles. Contact your doctor or a physical therapist for more information on knee rehabilitation.  ° °SKILLED REHAB INSTRUCTIONS: °If the patient is transferred to a skilled rehab facility following release from the hospital, a list of the current medications will be sent to the facility for the patient to continue.  When discharged from the skilled rehab facility, please have the facility set up the patient's Home Health Physical Therapy prior to being released. Also, the skilled facility will be responsible for providing the patient with their medications at time of release from the facility to include their pain medication, the muscle relaxants, and their blood thinner medication. If the patient is still at the rehab facility at time of the two week follow up appointment, the skilled rehab facility will also need to assist the patient in arranging follow up appointment in our office and any transportation needs. ° °MAKE SURE YOU:  °Understand these instructions.  °Will watch your condition.  °Will get help right away if you are not doing well or get worse.   ° ° °Pick up stool softner and laxative for home use following surgery while on pain medications. °Do NOT remove your dressing. You may shower.  °Do not take tub baths or submerge incision under water. °May shower starting three days after surgery. °Please use a clean towel to pat the incision dry following showers. °Continue to use ice for pain and swelling after surgery. °Do not use any lotions or creams on the incision until instructed by your surgeon. ° °Information on my medicine - ELIQUIS® (apixaban) ° °This medication education was reviewed with me or my healthcare representative as part of my discharge preparation.  The pharmacist that spoke with me during my hospital stay was:   ° °Why was Eliquis® prescribed for you? °Eliquis® was prescribed for you to reduce the risk of blood clots forming after orthopedic surgery.   ° °What do You need to know about Eliquis®? °Take your Eliquis® TWICE DAILY - one tablet in the morning and one tablet in the evening with or without food.  It would   be best to take the dose about the same time each day. ° °If you have difficulty swallowing the tablet whole please discuss with your pharmacist how to take the medication safely. ° °Take Eliquis® exactly as prescribed by your doctor and DO NOT stop taking Eliquis® without talking to the doctor who prescribed the medication.  Stopping without other medication to take the place of Eliquis® may increase your risk of developing a clot. ° °After discharge, you should have regular check-up appointments with your healthcare provider that is prescribing your Eliquis®. ° °What do you do if you miss a dose? °If a dose of ELIQUIS® is not taken at the scheduled time, take it as soon as possible on the same day and twice-daily administration should be resumed.  The dose should not be doubled to make up for a missed dose.  Do not take more than one tablet of ELIQUIS at the same time. ° °Important Safety Information °A possible side effect of  Eliquis® is bleeding. You should call your healthcare provider right away if you experience any of the following: °? Bleeding from an injury or your nose that does not stop. °? Unusual colored urine (red or dark brown) or unusual colored stools (red or black). °? Unusual bruising for unknown reasons. °? A serious fall or if you hit your head (even if there is no bleeding). ° °Some medicines may interact with Eliquis® and might increase your risk of bleeding or clotting while on Eliquis®. To help avoid this, consult your healthcare provider or pharmacist prior to using any new prescription or non-prescription medications, including herbals, vitamins, non-steroidal anti-inflammatory drugs (NSAIDs) and supplements. ° °This website has more information on Eliquis® (apixaban): http://www.eliquis.com/eliquis/home ° ° °

## 2020-04-19 ENCOUNTER — Encounter: Payer: Self-pay | Admitting: *Deleted

## 2020-04-19 DIAGNOSIS — Z87891 Personal history of nicotine dependence: Secondary | ICD-10-CM | POA: Diagnosis not present

## 2020-04-19 DIAGNOSIS — Z79899 Other long term (current) drug therapy: Secondary | ICD-10-CM | POA: Diagnosis not present

## 2020-04-19 DIAGNOSIS — I739 Peripheral vascular disease, unspecified: Secondary | ICD-10-CM | POA: Diagnosis not present

## 2020-04-19 DIAGNOSIS — I714 Abdominal aortic aneurysm, without rupture: Secondary | ICD-10-CM | POA: Diagnosis not present

## 2020-04-19 DIAGNOSIS — M1711 Unilateral primary osteoarthritis, right knee: Secondary | ICD-10-CM | POA: Diagnosis not present

## 2020-04-19 DIAGNOSIS — Z7982 Long term (current) use of aspirin: Secondary | ICD-10-CM | POA: Diagnosis not present

## 2020-04-19 DIAGNOSIS — E669 Obesity, unspecified: Secondary | ICD-10-CM | POA: Diagnosis not present

## 2020-04-19 DIAGNOSIS — Z96651 Presence of right artificial knee joint: Secondary | ICD-10-CM | POA: Diagnosis not present

## 2020-04-19 DIAGNOSIS — K219 Gastro-esophageal reflux disease without esophagitis: Secondary | ICD-10-CM | POA: Diagnosis not present

## 2020-04-19 DIAGNOSIS — I10 Essential (primary) hypertension: Secondary | ICD-10-CM | POA: Diagnosis not present

## 2020-04-19 LAB — BASIC METABOLIC PANEL
Anion gap: 9 (ref 5–15)
BUN: 19 mg/dL (ref 8–23)
CO2: 22 mmol/L (ref 22–32)
Calcium: 8.3 mg/dL — ABNORMAL LOW (ref 8.9–10.3)
Chloride: 106 mmol/L (ref 98–111)
Creatinine, Ser: 0.93 mg/dL (ref 0.61–1.24)
GFR calc Af Amer: >60 mL/min (ref >60)
GFR calc non Af Amer: >60 mL/min (ref >60)
Glucose, Bld: 143 mg/dL — ABNORMAL HIGH (ref 70–99)
Potassium: 3.7 mmol/L (ref 3.5–5.1)
Sodium: 137 mmol/L (ref 135–145)

## 2020-04-19 LAB — CBC
HCT: 27.5 % — ABNORMAL LOW (ref 39.0–52.0)
Hemoglobin: 9.1 g/dL — ABNORMAL LOW (ref 13.0–17.0)
MCH: 30.5 pg (ref 26.0–34.0)
MCHC: 33.1 g/dL (ref 30.0–36.0)
MCV: 92.3 fL (ref 80.0–100.0)
Platelets: 153 10*3/uL (ref 150–400)
RBC: 2.98 MIL/uL — ABNORMAL LOW (ref 4.22–5.81)
RDW: 13.4 % (ref 11.5–15.5)
WBC: 9 10*3/uL (ref 4.0–10.5)
nRBC: 0 % (ref 0.0–0.2)

## 2020-04-19 MED ORDER — SENNA 8.6 MG PO TABS: 2.0000 | ORAL_TABLET | Freq: Every day | ORAL | 0 refills | Status: DC

## 2020-04-19 MED ORDER — APIXABAN 2.5 MG PO TABS: 2.5000 mg | ORAL_TABLET | Freq: Two times a day (BID) | ORAL | 0 refills | Status: DC

## 2020-04-19 MED ORDER — CELECOXIB 200 MG PO CAPS: 200.0000 mg | ORAL_CAPSULE | Freq: Every day | ORAL | 2 refills | Status: DC

## 2020-04-19 MED ORDER — DOCUSATE SODIUM 100 MG PO CAPS: 100.0000 mg | ORAL_CAPSULE | Freq: Two times a day (BID) | ORAL | 0 refills | Status: DC

## 2020-04-19 MED ORDER — ONDANSETRON HCL 4 MG PO TABS: 4.0000 mg | ORAL_TABLET | Freq: Four times a day (QID) | ORAL | 0 refills | Status: DC | PRN

## 2020-04-19 MED ORDER — HYDROCODONE-ACETAMINOPHEN 5-325 MG PO TABS: 1.0000 | ORAL_TABLET | ORAL | 0 refills | Status: DC | PRN

## 2020-04-19 NOTE — Progress Notes (Signed)
Physical Therapy Treatment Patient Details Name: Kevin Pacheco. MRN: [id number] DOB: 20-Dec-1940 Today's Date: 04/19/2020    History of Present Illness Pt is an 79 year old male s/p R TKA with hx of L TKA and L TKA revision    PT Comments    Pt ambulated in hallway and practiced safe step technique.  Pt also performed LE exercises and provided with HEP handout.  Pt had no further questions and feels ready for d/c home today.    Follow Up Recommendations  Follow surgeon's recommendation for DC plan and follow-up therapies     Equipment Recommendations  Rolling walker with 5" wheels    Recommendations for Other Services       Precautions / Restrictions Precautions Precautions: Fall;Knee Restrictions Weight Bearing Restrictions: No    Mobility  Bed Mobility Overal bed mobility: Needs Assistance Bed Mobility: Supine to Sit     Supine to sit: Supervision        Transfers Overall transfer level: Needs assistance Equipment used: Rolling walker (2 wheeled) Transfers: Sit to/from Stand Sit to Stand: Supervision         General transfer comment: verbal cues for UE and LE positioning  Ambulation/Gait Ambulation/Gait assistance: Supervision;Min guard Gait Distance (Feet): 200 Feet Assistive device: Rolling walker (2 wheeled) Gait Pattern/deviations: Step-to pattern;Decreased stance time - right;Antalgic;Step-through pattern     General Gait Details: verbal cues for sequence, RW positioning, step length, heel strike   Stairs Stairs: Yes Stairs assistance: Min guard Stair Management: Step to pattern;Forwards;With walker Number of Stairs: 1 General stair comments: verbal cues for sequence and RW positioning; performed twice, pt reports understanding   Wheelchair Mobility    Modified Rankin (Stroke Patients Only)       Balance                                            Cognition Arousal/Alertness: Awake/alert Behavior During  Therapy: WFL for tasks assessed/performed Overall Cognitive Status: Within Functional Limits for tasks assessed                                        Exercises Total Joint Exercises Ankle Circles/Pumps: AROM;Both;10 reps Quad Sets: AROM;Both;10 reps Short Arc Quad: AROM;Right;10 reps Hip ABduction/ADduction: AROM;Right;10 reps Straight Leg Raises: AROM;Right;10 reps Knee Flexion: Right;Seated;10 reps;AAROM    General Comments        Pertinent Vitals/Pain Pain Assessment: 0-10 Pain Score: 3  Pain Location: right knee Pain Descriptors / Indicators: Aching;Sore Pain Intervention(s): Repositioned;Monitored during session;Ice applied    Home Living                      Prior Function            PT Goals (current goals can now be found in the care plan section) Progress towards PT goals: Progressing toward goals    Frequency    7X/week      PT Plan Current plan remains appropriate    Co-evaluation              AM-PAC PT "6 Clicks" Mobility   Outcome Measure  Help needed turning from your back to your side while in a flat bed without using bedrails?: A Little Help needed moving from lying on your  back to sitting on the side of a flat bed without using bedrails?: A Little Help needed moving to and from a bed to a chair (including a wheelchair)?: A Little Help needed standing up from a chair using your arms (e.g., wheelchair or bedside chair)?: A Little Help needed to walk in hospital room?: A Little Help needed climbing 3-5 steps with a railing? : A Little 6 Click Score: 18    End of Session Equipment Utilized During Treatment: Gait belt Activity Tolerance: Patient tolerated treatment well Patient left: in chair;with call bell/phone within reach;with chair alarm set Nurse Communication: Mobility status PT Visit Diagnosis: Other abnormalities of gait and mobility (R26.89)     Time: 1023-1050 PT Time Calculation (min) (ACUTE  ONLY): 27 min  Charges:  $Gait Training: 8-22 mins $Therapeutic Exercise: 8-22 mins                     Arlyce Dice, DPT Acute Rehabilitation Services Office: [place] 04/19/2020, 4:14 PM

## 2020-04-19 NOTE — Progress Notes (Signed)
    Subjective:  Patient reports pain as mild.  Denies N/V/CP/SOB. Wants to go home  Objective:   VITALS:   Vitals:   04/18/20 1951 04/18/20 2023 04/19/20 0153 04/19/20 0447  BP:  119/65 123/70 124/70  Pulse: 64 (!) 59 (!) 57 (!) 59  Resp:  15 16 15   Temp:  (!) 97.5 F (36.4 C) 97.6 F (36.4 C) (!) 97.5 F (36.4 C)  TempSrc:      SpO2:  98% 99% 96%  Weight:      Height:        NAD ABD soft Sensation intact distally Intact pulses distally Dorsiflexion/Plantar flexion intact Incision: dressing C/D/I Compartment soft   Lab Results  Component Value Date   WBC 9.0 04/19/2020   HGB 9.1 (L) 04/19/2020   HCT 27.5 (L) 04/19/2020   MCV 92.3 04/19/2020   PLT 153 04/19/2020   BMET    Component Value Date/Time   NA 137 04/19/2020 0346   K 3.7 04/19/2020 0346   CL 106 04/19/2020 0346   CO2 22 04/19/2020 0346   GLUCOSE 143 (H) 04/19/2020 0346   BUN 19 04/19/2020 0346   CREATININE 0.93 04/19/2020 0346   CALCIUM 8.3 (L) 04/19/2020 0346   GFRNONAA >60 04/19/2020 0346   GFRAA >60 04/19/2020 0346     Assessment/Plan: 1 Day Post-Op   Principal Problem:   Osteoarthritis of right knee   WBAT with walker DVT ppx: apixaban (h/o prostate CA), SCDs, TEDS PO pain control PT/OT Dispo: d/c home with OPPT   Kevin Pacheco 04/19/2020, 9:39 AM   Rod Can, MD [phone] Kevin Pacheco is now [facility]  [place] [address], [address], [place], [place] [address] Phone: [phone] [email or web address] Facebook  [employer]

## 2020-04-19 NOTE — Plan of Care (Signed)
  Problem: Health Behavior/Discharge Planning: Goal: Ability to manage health-related needs will improve Outcome: Adequate for Discharge   Problem: Clinical Measurements: Goal: Ability to maintain clinical measurements within normal limits will improve Outcome: Adequate for Discharge Goal: Will remain free from infection Outcome: Adequate for Discharge Goal: Diagnostic test results will improve Outcome: Adequate for Discharge Goal: Respiratory complications will improve Outcome: Adequate for Discharge Goal: Cardiovascular complication will be avoided Outcome: Adequate for Discharge   Problem: Activity: Goal: Risk for activity intolerance will decrease Outcome: Adequate for Discharge   Problem: Nutrition: Goal: Adequate nutrition will be maintained Outcome: Adequate for Discharge   Problem: Elimination: Goal: Will not experience complications related to bowel motility Outcome: Adequate for Discharge Goal: Will not experience complications related to urinary retention Outcome: Adequate for Discharge   Problem: Pain Managment: Goal: General experience of comfort will improve Outcome: Adequate for Discharge   Problem: Safety: Goal: Ability to remain free from injury will improve Outcome: Adequate for Discharge   Problem: Skin Integrity: Goal: Risk for impaired skin integrity will decrease Outcome: Adequate for Discharge   Problem: Education: Goal: Knowledge of the prescribed therapeutic regimen will improve Outcome: Adequate for Discharge   Problem: Activity: Goal: Ability to avoid complications of mobility impairment will improve Outcome: Adequate for Discharge Goal: Range of joint motion will improve Outcome: Adequate for Discharge   Problem: Clinical Measurements: Goal: Postoperative complications will be avoided or minimized Outcome: Adequate for Discharge   Problem: Pain Management: Goal: Pain level will decrease with appropriate interventions Outcome:  Adequate for Discharge   Problem: Skin Integrity: Goal: Will show signs of wound healing Outcome: Adequate for Discharge   Problem: Acute Rehab PT Goals(only PT should resolve) Goal: Pt Will Go Supine/Side To Sit Outcome: Adequate for Discharge Goal: Patient Will Transfer Sit To/From Stand Outcome: Adequate for Discharge Goal: Pt Will Ambulate Outcome: Adequate for Discharge Goal: Pt Will Go Up/Down Stairs Outcome: Adequate for Discharge

## 2020-04-19 NOTE — TOC Transition Note (Signed)
Transition of Care [facility]) - CM/SW Discharge Note   Patient Details  Name: Kevin Pacheco. MRN: [id number] Date of Birth: 05-12-1941  [facility] of [facility]) CM/SW Contact:  Lia Hopping, LCSW Phone Number: 04/19/2020, 9:59 AM   Clinical Narrative:    [place] [facility] plan of care.  RW delivered to patient bedside by [employer].        Patient Goals and CMS Choice        Discharge Placement                      Discharge Plan and Services                DME Arranged: Walker rolling DME Agency: [employer] Date DME Agency Contacted: 04/19/20 Time DME Agency Contacted: 0900 Representative spoke with at DME Agency: Ovid Curd HH Arranged: NA [place] Agency: NA        Social Determinants of Health ([place]) Interventions     Readmission Risk Interventions No flowsheet data found.

## 2020-04-19 NOTE — Discharge Summary (Signed)
Physician Discharge Summary  Patient ID: Kevin Pacheco. MRN: [id number] DOB/AGE: 81-Dec-1942 81 y.o.  Admit date: 04/18/2020 Discharge date: 04/19/2020  Admission Diagnoses:  Osteoarthritis of right knee  Discharge Diagnoses:  Principal Problem:   Osteoarthritis of right knee   Past Medical History:  Diagnosis Date  . AAA (abdominal aortic aneurysm) ([place]) 12/15/2019   A moderate sized abdominal aortic aneurysm measuring 4.03 x 4.01 x 4.24 cm is seen in the mid aorta.  . Arthritis   . Bilateral carotid artery stenosis   . Essential hypertension 12/30/2018  . GERD (gastroesophageal reflux disease)   . H/O tobacco use, presenting hazards to health 12/30/2018   > 50-60 pack year quit 2012  . Headache(784.0)   . History of glaucoma   . Incomplete RBBB 12/29/2019   Noted on EKG  . Prostate cancer ([place])   . Pure hypercholesterolemia 12/30/2018  . Sickle cell trait ([place])   . Urinary incontinence     Surgeries: Procedure(s): COMPUTER ASSISTED TOTAL KNEE ARTHROPLASTY on 04/18/2020   Consultants (if any):   Discharged Condition: Improved  Hospital Course: Kevin Kilmer. is an 79 y.o. male who was admitted 04/18/2020 with a diagnosis of Osteoarthritis of right knee and went to the operating room on 04/18/2020 and underwent the above named procedures.    He was given perioperative antibiotics:  Anti-infectives (From admission, onward)   Start     Dose/Rate Route Frequency Ordered Stop   04/18/20 1400  ceFAZolin (ANCEF) IVPB 2g/100 mL premix     2 g 200 mL/hr over 30 Minutes Intravenous Every 6 hours 04/18/20 1122 04/18/20 2017   04/18/20 0600  ceFAZolin (ANCEF) IVPB 2g/100 mL premix     2 g 200 mL/hr over 30 Minutes Intravenous On call to O.R. 04/18/20 [id number] 04/18/20 [id number]    .  He was given sequential compression devices, early ambulation, and apixaban for DVT prophylaxis.  He benefited maximally from the hospital stay and there were no complications.    Recent vital  signs:  Vitals:   04/19/20 0153 04/19/20 0447  BP: 123/70 124/70  Pulse: (!) 57 (!) 59  Resp: 16 15  Temp: 97.6 F (36.4 C) (!) 97.5 F (36.4 C)  SpO2: 99% 96%    Recent laboratory studies:  Lab Results  Component Value Date   HGB 9.1 (L) 04/19/2020   HGB 11.3 (L) 04/16/2020   HGB 13.9 12/14/2015   Lab Results  Component Value Date   WBC 9.0 04/19/2020   PLT 153 04/19/2020   Lab Results  Component Value Date   INR 0.9 04/16/2020   Lab Results  Component Value Date   NA 137 04/19/2020   K 3.7 04/19/2020   CL 106 04/19/2020   CO2 22 04/19/2020   BUN 19 04/19/2020   CREATININE 0.93 04/19/2020   GLUCOSE 143 (H) 04/19/2020    Discharge Medications:   Allergies as of 04/19/2020   No Known Allergies     Medication List    STOP taking these medications   aspirin EC 81 MG tablet     TAKE these medications   amLODipine 10 MG tablet Commonly known as: NORVASC Take 1 tablet (10 mg total) by mouth daily.   apixaban 2.5 MG Tabs tablet Commonly known as: ELIQUIS Take 1 tablet (2.5 mg total) by mouth every 12 (twelve) hours.   aspirin-sod bicarb-citric acid 325 MG Tbef tablet Commonly known as: ALKA-SELTZER Take 650 mg by mouth every 6 (six) hours as needed (indigestion).  atorvastatin 20 MG tablet Commonly known as: LIPITOR TAKE 1 TABLET EVERY DAY   celecoxib 200 MG capsule Commonly known as: CELEBREX Take 1 capsule (200 mg total) by mouth daily.   docusate sodium 100 MG capsule Commonly known as: COLACE Take 1 capsule (100 mg total) by mouth 2 (two) times daily.   HYDROcodone-acetaminophen 5-325 MG tablet Commonly known as: NORCO/VICODIN Take 1 tablet by mouth every 4 (four) hours as needed for moderate pain (pain score 4-6).   irbesartan 300 MG tablet Commonly known as: AVAPRO TAKE 1 TABLET EVERY DAY   leuprolide 7.5 MG injection Commonly known as: LUPRON Inject 7.5 mg into the muscle every 3 (three) months.   metoprolol tartrate 25 MG  tablet Commonly known as: LOPRESSOR Take 25 mg by mouth 2 (two) times daily.   omeprazole 40 MG capsule Commonly known as: PRILOSEC Take 40 mg by mouth daily with breakfast.   ondansetron 4 MG tablet Commonly known as: ZOFRAN Take 1 tablet (4 mg total) by mouth every 6 (six) hours as needed for nausea.   senna 8.6 MG Tabs tablet Commonly known as: SENOKOT Take 2 tablets (17.2 mg total) by mouth at bedtime.   VITRON-C PO Take 1 tablet by mouth daily.       Diagnostic Studies: DG Knee Right Port  Result Date: 04/18/2020 CLINICAL DATA:  Postop total knee replacement. EXAM: PORTABLE RIGHT KNEE - 1-2 VIEW COMPARISON:  None. FINDINGS: Right total knee arthroplasty. Subcutaneous and joint air/fluid present. No complicating features. IMPRESSION: Right total knee arthroplasty with expected postoperative findings. Electronically Signed   By: Kevin Picket M.D.   On: 04/18/2020 10:59    Disposition: Discharge disposition: 01-Home or Self Care       Discharge Instructions    Call MD / Call 911   Complete by: As directed    If you experience chest pain or shortness of breath, CALL 911 and be transported to the hospital emergency room.  If you develope a fever above 101 F, pus (white drainage) or increased drainage or redness at the wound, or calf pain, call your surgeon's office.   Constipation Prevention   Complete by: As directed    Drink plenty of fluids.  Prune juice may be helpful.  You may use a stool softener, such as Colace (over the counter) 100 mg twice a day.  Use MiraLax (over the counter) for constipation as needed.   Diet - low sodium heart healthy   Complete by: As directed    Do not put a pillow under the knee. Place it under the heel.   Complete by: As directed    Driving restrictions   Complete by: As directed    No driving for 6 weeks   Increase activity slowly as tolerated   Complete by: As directed    Lifting restrictions   Complete by: As directed    No  lifting for 6 weeks   TED hose   Complete by: As directed    Use stockings (TED hose) for 2 weeks on both leg(s).  You may remove them at night for sleeping.      Follow-up Information    [facility] Go on 04/24/2020.   Why: You are scheduled for a physical therapy appointment on 04-24-20 at 9:15 am.  Contact information: [place] [id number] [phone]        Rod Can, MD. Go on 05/01/2020.   Specialty: Orthopedic Surgery Why: You are  scheduled for a post-operative appointment on 05-01-20 at 8:00 am.  Contact information: [address] [place] [address] [phone]            Signed: Hilton Cork Letisha Pacheco 04/19/2020, 9:39 AM

## 2020-04-24 DIAGNOSIS — M25661 Stiffness of right knee, not elsewhere classified: Secondary | ICD-10-CM | POA: Diagnosis not present

## 2020-04-24 DIAGNOSIS — M25561 Pain in right knee: Secondary | ICD-10-CM | POA: Diagnosis not present

## 2020-04-26 DIAGNOSIS — M25661 Stiffness of right knee, not elsewhere classified: Secondary | ICD-10-CM | POA: Diagnosis not present

## 2020-04-26 DIAGNOSIS — M25561 Pain in right knee: Secondary | ICD-10-CM | POA: Diagnosis not present

## 2020-05-01 DIAGNOSIS — Z96651 Presence of right artificial knee joint: Secondary | ICD-10-CM | POA: Diagnosis not present

## 2020-05-01 DIAGNOSIS — Z471 Aftercare following joint replacement surgery: Secondary | ICD-10-CM | POA: Diagnosis not present

## 2020-05-01 DIAGNOSIS — M25561 Pain in right knee: Secondary | ICD-10-CM | POA: Diagnosis not present

## 2020-05-01 DIAGNOSIS — M25661 Stiffness of right knee, not elsewhere classified: Secondary | ICD-10-CM | POA: Diagnosis not present

## 2020-05-03 DIAGNOSIS — C61 Malignant neoplasm of prostate: Secondary | ICD-10-CM | POA: Diagnosis not present

## 2020-05-03 DIAGNOSIS — I700 Atherosclerosis of aorta: Secondary | ICD-10-CM | POA: Diagnosis not present

## 2020-05-03 DIAGNOSIS — E663 Overweight: Secondary | ICD-10-CM | POA: Diagnosis not present

## 2020-05-03 DIAGNOSIS — R7301 Impaired fasting glucose: Secondary | ICD-10-CM | POA: Diagnosis not present

## 2020-05-03 DIAGNOSIS — M25661 Stiffness of right knee, not elsewhere classified: Secondary | ICD-10-CM | POA: Diagnosis not present

## 2020-05-03 DIAGNOSIS — Z96653 Presence of artificial knee joint, bilateral: Secondary | ICD-10-CM | POA: Diagnosis not present

## 2020-05-03 DIAGNOSIS — I6529 Occlusion and stenosis of unspecified carotid artery: Secondary | ICD-10-CM | POA: Diagnosis not present

## 2020-05-03 DIAGNOSIS — M25561 Pain in right knee: Secondary | ICD-10-CM | POA: Diagnosis not present

## 2020-05-03 DIAGNOSIS — D638 Anemia in other chronic diseases classified elsewhere: Secondary | ICD-10-CM | POA: Diagnosis not present

## 2020-05-03 DIAGNOSIS — E7800 Pure hypercholesterolemia, unspecified: Secondary | ICD-10-CM | POA: Diagnosis not present

## 2020-05-03 DIAGNOSIS — I10 Essential (primary) hypertension: Secondary | ICD-10-CM | POA: Diagnosis not present

## 2020-05-08 DIAGNOSIS — M25661 Stiffness of right knee, not elsewhere classified: Secondary | ICD-10-CM | POA: Diagnosis not present

## 2020-05-08 DIAGNOSIS — M25561 Pain in right knee: Secondary | ICD-10-CM | POA: Diagnosis not present

## 2020-05-10 DIAGNOSIS — M25561 Pain in right knee: Secondary | ICD-10-CM | POA: Diagnosis not present

## 2020-05-10 DIAGNOSIS — M25661 Stiffness of right knee, not elsewhere classified: Secondary | ICD-10-CM | POA: Diagnosis not present

## 2020-05-15 DIAGNOSIS — M25661 Stiffness of right knee, not elsewhere classified: Secondary | ICD-10-CM | POA: Diagnosis not present

## 2020-05-15 DIAGNOSIS — M25561 Pain in right knee: Secondary | ICD-10-CM | POA: Diagnosis not present

## 2020-05-17 DIAGNOSIS — M25561 Pain in right knee: Secondary | ICD-10-CM | POA: Diagnosis not present

## 2020-05-17 DIAGNOSIS — M25661 Stiffness of right knee, not elsewhere classified: Secondary | ICD-10-CM | POA: Diagnosis not present

## 2020-05-22 DIAGNOSIS — M25561 Pain in right knee: Secondary | ICD-10-CM | POA: Diagnosis not present

## 2020-05-22 DIAGNOSIS — M25661 Stiffness of right knee, not elsewhere classified: Secondary | ICD-10-CM | POA: Diagnosis not present

## 2020-05-24 DIAGNOSIS — M25561 Pain in right knee: Secondary | ICD-10-CM | POA: Diagnosis not present

## 2020-05-24 DIAGNOSIS — M25661 Stiffness of right knee, not elsewhere classified: Secondary | ICD-10-CM | POA: Diagnosis not present

## 2020-05-29 DIAGNOSIS — Z96651 Presence of right artificial knee joint: Secondary | ICD-10-CM | POA: Diagnosis not present

## 2020-05-29 DIAGNOSIS — Z471 Aftercare following joint replacement surgery: Secondary | ICD-10-CM | POA: Diagnosis not present

## 2020-07-12 ENCOUNTER — Other Ambulatory Visit: Payer: Self-pay | Admitting: Cardiology

## 2020-07-20 DIAGNOSIS — C61 Malignant neoplasm of prostate: Secondary | ICD-10-CM | POA: Diagnosis not present

## 2020-09-07 IMAGING — DX DG KNEE 1-2V PORT*R*
3 series · 3 of 3 positions shown · non-contrast
Comparison: None.

CLINICAL DATA: Postop total knee replacement.

EXAM:
PORTABLE RIGHT KNEE - 1-2 VIEW

[knee ap]
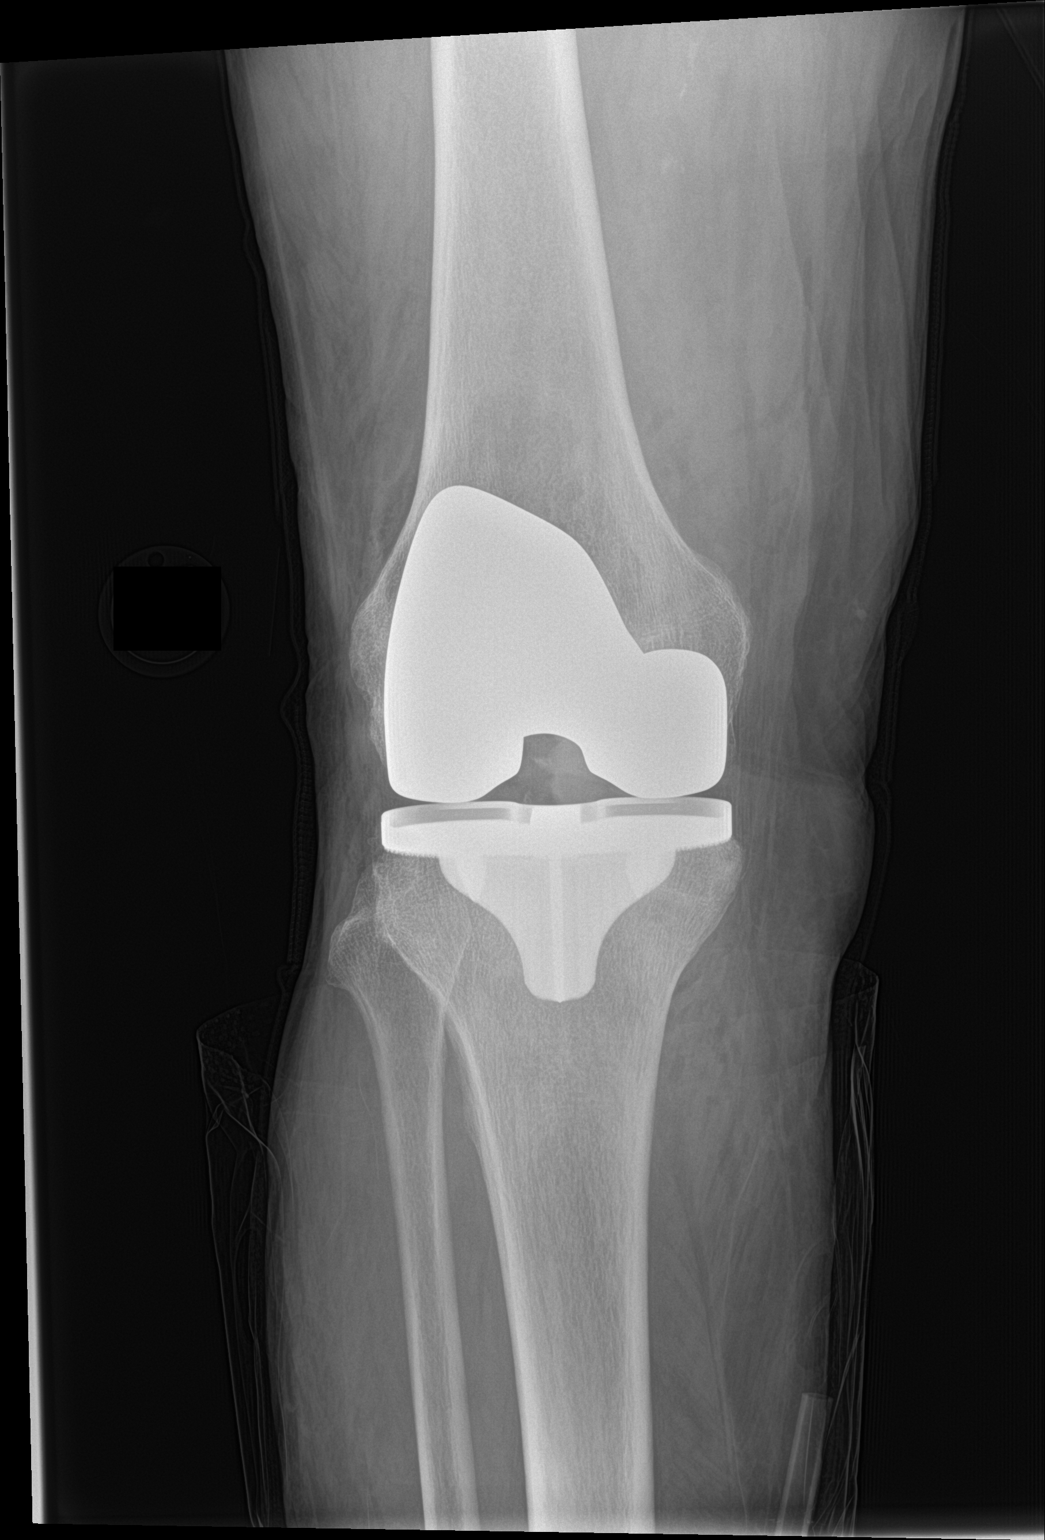

[knee lat (1 of 2)]
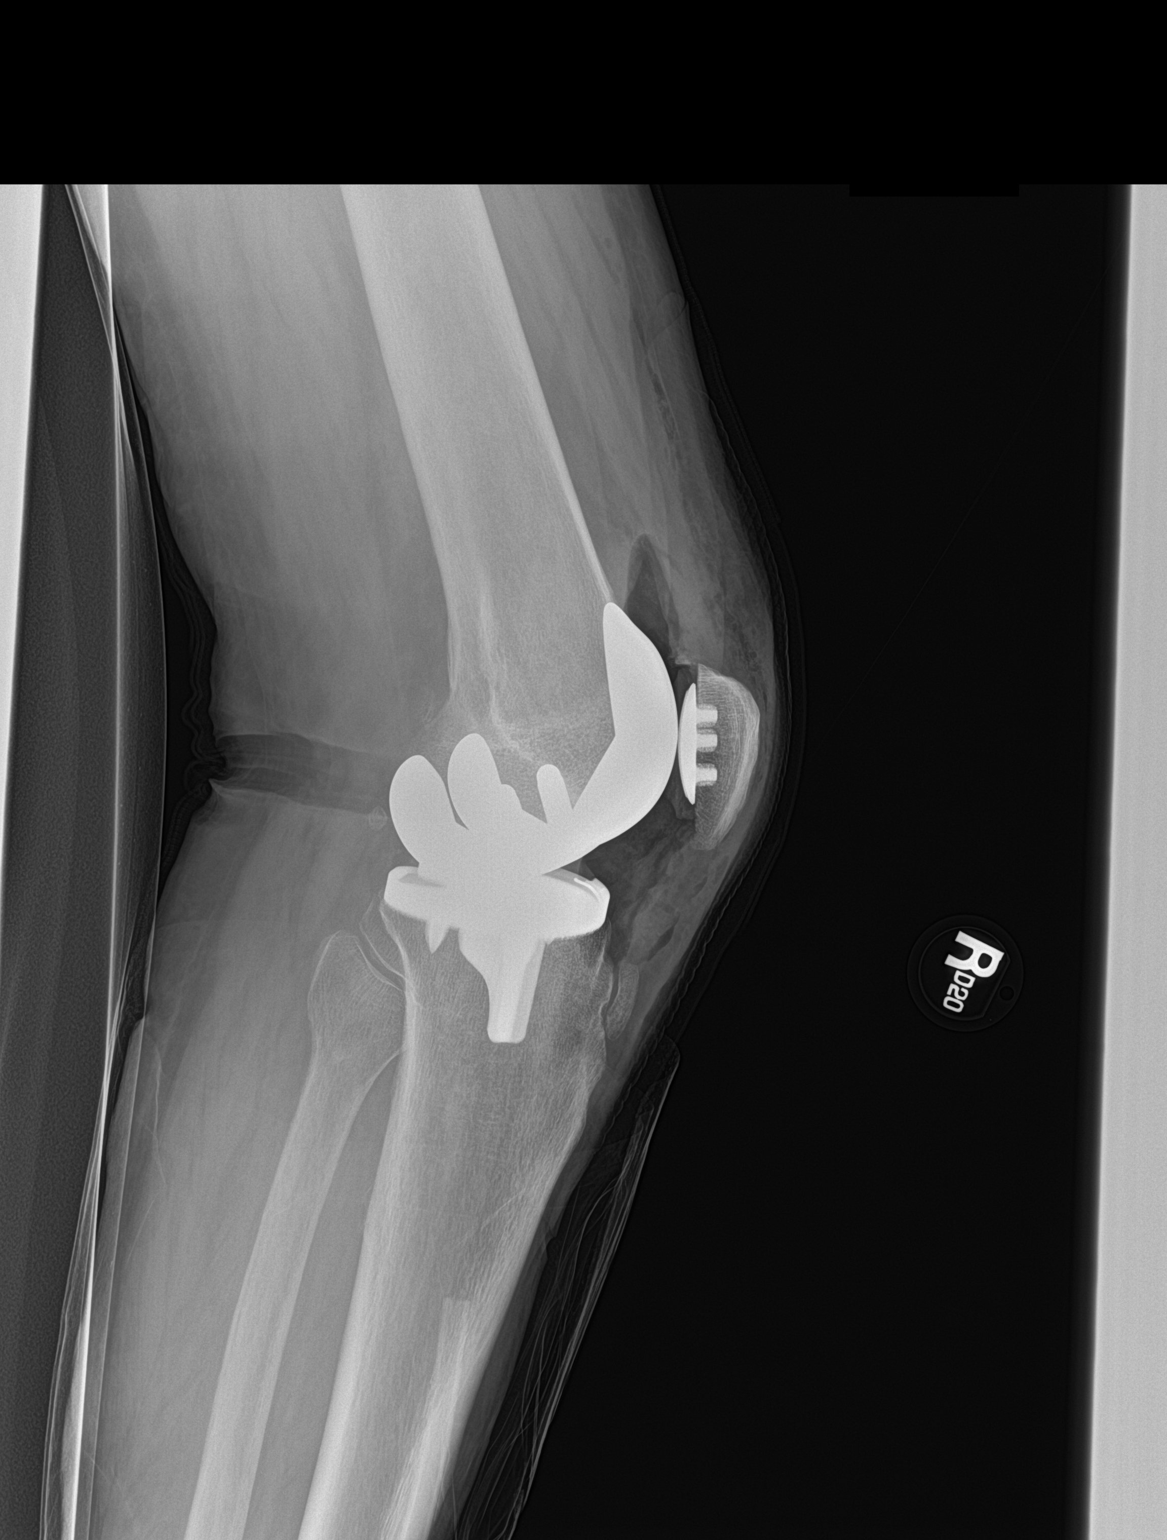

[knee lat (2 of 2)]
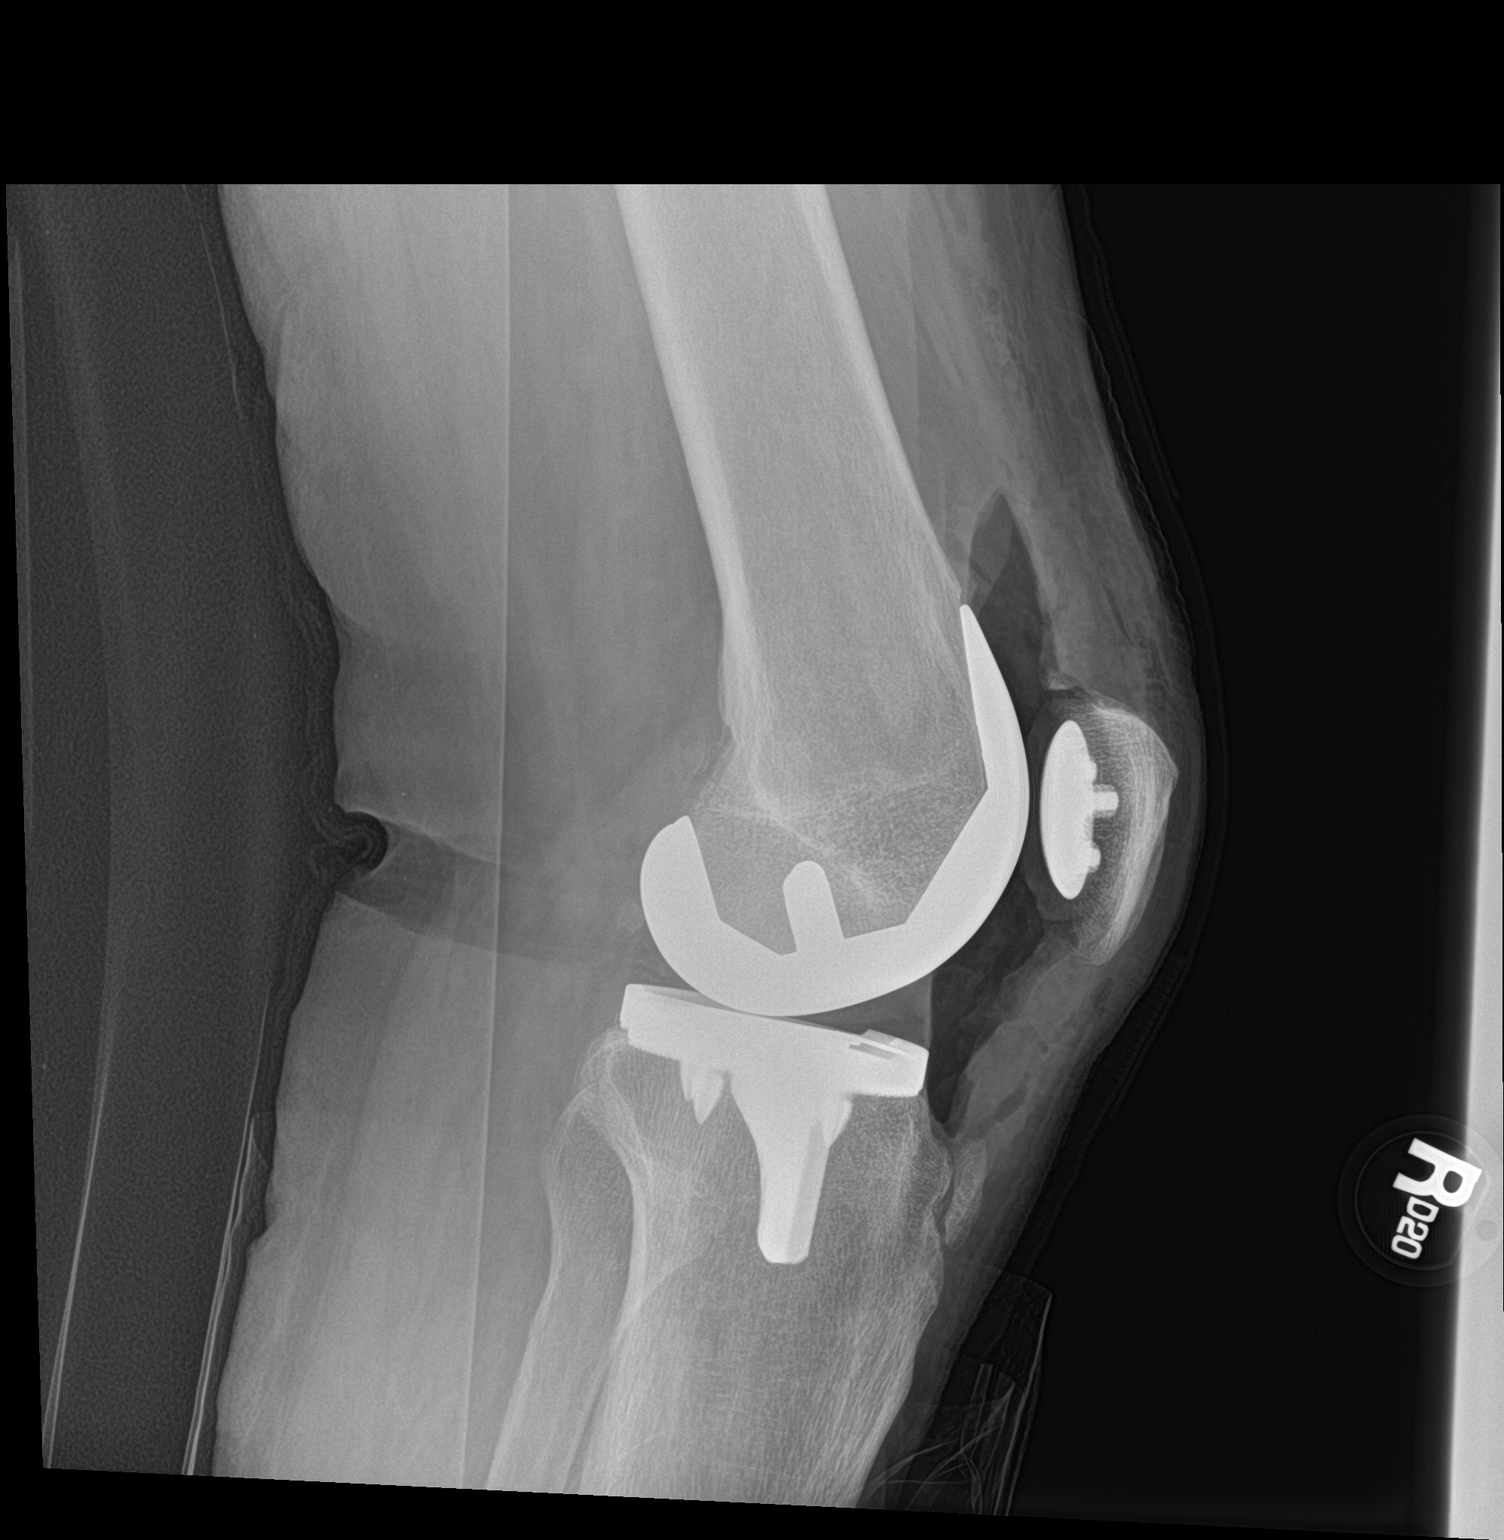

[3 of 3 positions shown; findings below may reference images not displayed]

FINDINGS: Right total knee arthroplasty. Subcutaneous and joint air/fluid
present. No complicating features.
IMPRESSION: Right total knee arthroplasty with expected postoperative findings.

## 2020-09-10 DIAGNOSIS — H401132 Primary open-angle glaucoma, bilateral, moderate stage: Secondary | ICD-10-CM | POA: Diagnosis not present

## 2020-10-25 DIAGNOSIS — C61 Malignant neoplasm of prostate: Secondary | ICD-10-CM | POA: Diagnosis not present

## 2020-10-29 DIAGNOSIS — Z125 Encounter for screening for malignant neoplasm of prostate: Secondary | ICD-10-CM | POA: Diagnosis not present

## 2020-10-29 DIAGNOSIS — R7301 Impaired fasting glucose: Secondary | ICD-10-CM | POA: Diagnosis not present

## 2020-10-29 DIAGNOSIS — E7800 Pure hypercholesterolemia, unspecified: Secondary | ICD-10-CM | POA: Diagnosis not present

## 2020-11-05 DIAGNOSIS — Z1212 Encounter for screening for malignant neoplasm of rectum: Secondary | ICD-10-CM | POA: Diagnosis not present

## 2020-11-05 DIAGNOSIS — Z96653 Presence of artificial knee joint, bilateral: Secondary | ICD-10-CM | POA: Diagnosis not present

## 2020-11-05 DIAGNOSIS — R82998 Other abnormal findings in urine: Secondary | ICD-10-CM | POA: Diagnosis not present

## 2020-11-05 DIAGNOSIS — I10 Essential (primary) hypertension: Secondary | ICD-10-CM | POA: Diagnosis not present

## 2020-11-05 DIAGNOSIS — I6529 Occlusion and stenosis of unspecified carotid artery: Secondary | ICD-10-CM | POA: Diagnosis not present

## 2020-11-05 DIAGNOSIS — I700 Atherosclerosis of aorta: Secondary | ICD-10-CM | POA: Diagnosis not present

## 2020-11-05 DIAGNOSIS — N3945 Continuous leakage: Secondary | ICD-10-CM | POA: Diagnosis not present

## 2020-11-05 DIAGNOSIS — C61 Malignant neoplasm of prostate: Secondary | ICD-10-CM | POA: Diagnosis not present

## 2020-11-05 DIAGNOSIS — R32 Unspecified urinary incontinence: Secondary | ICD-10-CM | POA: Diagnosis not present

## 2020-11-05 DIAGNOSIS — I714 Abdominal aortic aneurysm, without rupture: Secondary | ICD-10-CM | POA: Diagnosis not present

## 2020-11-05 DIAGNOSIS — Z0000 Encounter for general adult medical examination without abnormal findings: Secondary | ICD-10-CM | POA: Diagnosis not present

## 2020-11-12 DIAGNOSIS — Z96651 Presence of right artificial knee joint: Secondary | ICD-10-CM | POA: Diagnosis not present

## 2020-11-13 ENCOUNTER — Other Ambulatory Visit: Payer: Self-pay | Admitting: Cardiology

## 2020-12-01 DIAGNOSIS — Z1152 Encounter for screening for COVID-19: Secondary | ICD-10-CM | POA: Diagnosis not present

## 2020-12-14 ENCOUNTER — Ambulatory Visit: Payer: Medicare HMO

## 2020-12-14 ENCOUNTER — Other Ambulatory Visit: Payer: Self-pay

## 2020-12-14 DIAGNOSIS — I714 Abdominal aortic aneurysm, without rupture, unspecified: Secondary | ICD-10-CM

## 2020-12-15 ENCOUNTER — Other Ambulatory Visit: Payer: Self-pay | Admitting: Cardiology

## 2020-12-15 DIAGNOSIS — I714 Abdominal aortic aneurysm, without rupture, unspecified: Secondary | ICD-10-CM

## 2020-12-28 ENCOUNTER — Ambulatory Visit: Payer: Medicare HMO | Admitting: Cardiology

## 2020-12-31 ENCOUNTER — Ambulatory Visit: Payer: Medicare HMO | Admitting: Cardiology

## 2020-12-31 ENCOUNTER — Other Ambulatory Visit: Payer: Self-pay

## 2020-12-31 ENCOUNTER — Encounter: Payer: Self-pay | Admitting: Cardiology

## 2020-12-31 VITALS — BP 132/70 | HR 73 | Temp 97.5°F | Resp 17 | Ht 69.0 in | Wt 208.0 lb

## 2020-12-31 DIAGNOSIS — I714 Abdominal aortic aneurysm, without rupture, unspecified: Secondary | ICD-10-CM

## 2020-12-31 DIAGNOSIS — N3945 Continuous leakage: Secondary | ICD-10-CM | POA: Diagnosis not present

## 2020-12-31 DIAGNOSIS — I10 Essential (primary) hypertension: Secondary | ICD-10-CM | POA: Diagnosis not present

## 2020-12-31 DIAGNOSIS — E7800 Pure hypercholesterolemia, unspecified: Secondary | ICD-10-CM

## 2020-12-31 MED ORDER — OLMESARTAN MEDOXOMIL 40 MG PO TABS: 40.0000 mg | ORAL_TABLET | Freq: Every day | ORAL | 3 refills | Status: DC

## 2020-12-31 NOTE — Progress Notes (Signed)
Primary Physician/Referring:  Haywood Pao, MD  Patient ID: Kevin Roup., male    DOB: 1941-08-10, 80 y.o.   MRN: [id number]  Chief Complaint  Patient presents with  . AAA  . Hypertension  . Hyperlipidemia    1 year   HPI:    Fredderick Swanger.  is a 80 y.o. Afro-American male with history of hypertension and hyperlipidemia, lipids are well controlled, 4 cm AAA. He has history of prostate cancer and presently on Lupron injections and in remission, history of 60 pack year history of cigarette smoking, quit in 2002 and thoracic aortic atherosclerosis by chest x-ray. He is here on a 12 month office visit and follow-up of AAA.  He has had a low risk nuclear stress test in February 2017 although the EKG portion was abnormal which are suggestive ischemia and he had normal exercise tolerance. Echocardiogram at that time was essentially unremarkable.  He has noticed his blood pressure to be elevated at home but otherwise no other specific complaints today.  Past Medical History:  Diagnosis Date  . AAA (abdominal aortic aneurysm) ([place]) 12/15/2019   A moderate sized abdominal aortic aneurysm measuring 4.03 x 4.01 x 4.24 cm is seen in the mid aorta.  . Arthritis   . Bilateral carotid artery stenosis   . Essential hypertension 12/30/2018  . GERD (gastroesophageal reflux disease)   . H/O tobacco use, presenting hazards to health 12/30/2018   > 50-60 pack year quit 2012  . Headache(784.0)   . History of glaucoma   . Incomplete RBBB 12/29/2019   Noted on EKG  . Prostate cancer ([place])   . Pure hypercholesterolemia 12/30/2018  . Sickle cell trait ([place])   . Urinary incontinence    Past Surgical History:  Procedure Laterality Date  . ARTHROSCOPY KNEE W/ DRILLING Left 05/01/10  . BLADDER TUMOR EXCISION  2009  . CARDIAC CATHETERIZATION     hx 1980's can't remember M D   . CATARACT EXTRACTION, BILATERAL    . COLONOSCOPY  2005, 2012  . FRACTURE SURGERY Right    ankle  . I & D KNEE  WITH POLY EXCHANGE Left 03/14/2013   Procedure: total knee revision;  Surgeon: Vickey Huger, MD;  Location: [facility];  Service: Orthopedics;  Laterality: Left;  . JOINT REPLACEMENT Left 02/06/11  . KNEE ARTHROPLASTY Right 04/18/2020   Procedure: COMPUTER ASSISTED TOTAL KNEE ARTHROPLASTY;  Surgeon: Rod Can, MD;  Location: [facility];  Service: Orthopedics;  Laterality: Right;  . PROSTATECTOMY  2001  . urethal sling  2009   Social History   Tobacco Use  . Smoking status: Former Smoker    Packs/day: 1.00    Years: 40.00    Pack years: 40.00    Types: Cigarettes    Quit date: 12/24/2000    Years since quitting: 20.0  . Smokeless tobacco: Never Used  Substance Use Topics  . Alcohol use: Yes    Alcohol/week: 4.0 standard drinks    Types: 4 Cans of beer per week    ROS  Review of Systems  Constitutional: Negative for weight gain.  Cardiovascular: Negative for dyspnea on exertion, leg swelling and syncope.  Respiratory: Negative for hemoptysis.   Endocrine: Negative for cold intolerance.  Hematologic/Lymphatic: Does not bruise/bleed easily.  Musculoskeletal: Positive for joint pain (knees).  Gastrointestinal: Negative for hematochezia and melena.  Neurological: Negative for headaches and light-headedness.   Objective  Blood pressure 132/70, pulse 73, temperature (!) 97.5 F (36.4 C), temperature source Temporal, resp.  rate 17, height 5\' 9"  (1.753 m), weight 208 lb (94.3 kg), SpO2 98 %.  Vitals with BMI 12/31/2020 04/19/2020 04/19/2020  Height 5\' 9"  - -  Weight 208 lbs - -  BMI 99991111 - -  Systolic [id number]  Diastolic 70 71 70  Pulse 73 57 59     Physical Exam Constitutional:      Appearance: He is well-developed.  Neck:     Thyroid: No thyromegaly.  Cardiovascular:     Rate and Rhythm: Normal rate and regular rhythm.     Pulses: Intact distal pulses.     Heart sounds: Normal heart sounds. No murmur heard. No gallop.      Comments: No leg edema, no JVD. Pulmonary:      Effort: Pulmonary effort is normal.     Breath sounds: Normal breath sounds.  Abdominal:     General: Bowel sounds are normal.     Palpations: Abdomen is soft.     Comments: Pulsatile abdominal mass, measures 4 cm. Non-tender  Musculoskeletal:     Cervical back: Neck supple.  Skin:    General: Skin is warm and dry.    Laboratory examination:   External labs 10/27/2019:   Cholesterol, total 135.000 m 10/27/2019 HDL 45 MG/DL 10/27/2019 LDL 54.000 mg 10/27/2019 Triglycerides 181.000 10/27/2019  A1C 5.700 % 10/27/2019 Hemoglobin 10.900 g/ 10/27/2019  Creatinine, Serum 1.000 mg/ 10/27/2019 Potassium 4.000 mm 12/02/2019 ALT (SGPT) 12.000 U/L 12/02/2019  Medications and allergies  No Known Allergies   Current Outpatient Medications on File Prior to Visit  Medication Sig Dispense Refill  . amLODipine (NORVASC) 10 MG tablet Take 1 tablet (10 mg total) by mouth daily. 90 tablet 3  . aspirin-sod bicarb-citric acid (ALKA-SELTZER) 325 MG TBEF tablet Take 650 mg by mouth every 6 (six) hours as needed (indigestion).    Marland Kitchen atorvastatin (LIPITOR) 20 MG tablet TAKE 1 TABLET EVERY DAY 90 tablet 3  . leuprolide (LUPRON) 7.5 MG injection Inject 7.5 mg into the muscle every 3 (three) months.    . metoprolol tartrate (LOPRESSOR) 25 MG tablet Take 25 mg by mouth 2 (two) times daily.     Marland Kitchen omeprazole (PRILOSEC) 40 MG capsule Take 40 mg by mouth daily with breakfast.     No current facility-administered medications on file prior to visit.    Radiology:  No results found.  Cardiac Studies:   Echocardiogram [01/10/2016]:  Left ventricle cavity is normal in size. Mild concentric hypertrophy of the left ventricle. Normal global wall motion. Normal diastolic filling pattern. Calculated EF 55%. Left atrial cavity is mildly dilated. Trace aortic regurgitation. Mild tricuspid regurgitation. No evidence of pulmonary hypertension.  Nuclear stress test [01/07/2016]:  1. The resting electrocardiogram  demonstrated normal sinus rhythm, normal resting conduction and no resting arrhythmias. The stress electrocardiogram was normal. Ther was 2 mm upsloping ST depression with exercise back to baseline at <1 minute into recovery. The patient performed treadmill exercise using a Bruce protocol, completing 6;48 minutes. The patient completed an estimated workload of 8.27 METS, of the maximum predicted heart rate. The stress test was terminated because of fatigue. 2. Myocardial perfusion imaging is normal. Overall left ventricular systolic function was normal without regional wall motion abnormalities. The left ventricular ejection fraction was normal visually but calculated at (44%). This is a low risk study.  Abdominal Aortic Duplex 12/14/2020: Moderate dilatation of the abdominal aorta is noted in the mid aorta. Mild plaque noted in the proximal, mid and distal aorta.  An  abdominal aortic aneurysm measuring 4.1 x 4 x 4.36 cm is seen in mid aorta. Normal distal aorta. Bilateral common iliac arteries show aneurysmal dilatation measuring 1.4 to 1.5 cm. Normal velocity in the aorta and iliac vessels.  Compared to the study done on 12/18/2019, mild increase in the size of the aneurysm from 4.1 cm. Recheck in 6 months for stability.  EKG:   EKG 12/31/2020: Normal sinus rhythm at rate of 58 bpm, left axis deviation, incomplete right bundle branch block.  No significant change from 12/29/2019.  Assessment     ICD-10-CM   1. Essential hypertension  I10 EKG 12-Lead    olmesartan (BENICAR) 40 MG tablet    Basic metabolic panel    Basic metabolic panel  2. Abdominal aortic aneurysm (AAA) >39 mm diameter ([facility])  I71.4   3. Pure hypercholesterolemia  E78.00   4. Continuous leakage of urine  N39.45 Ambulatory referral to Urology   Meds ordered this encounter  Medications  . olmesartan (BENICAR) 40 MG tablet    Sig: Take 1 tablet (40 mg total) by mouth daily.    Dispense:  90 tablet    Refill:  3     Discontinue Avapro  Efficacy    Medications Discontinued During This Encounter  Medication Reason  . apixaban (ELIQUIS) 2.5 MG TABS tablet Error  . celecoxib (CELEBREX) 200 MG capsule Error  . docusate sodium (COLACE) 100 MG capsule Error  . HYDROcodone-acetaminophen (NORCO/VICODIN) 5-325 MG tablet Error  . Iron-Vitamin C (VITRON-C PO) Error  . ondansetron (ZOFRAN) 4 MG tablet Error  . senna (SENOKOT) 8.6 MG TABS tablet Error  . irbesartan (AVAPRO) 300 MG tablet Change in therapy     Recommendations:   Terek Bee.  is a 80 y.o. Afro-American male with history of hypertension and hyperlipidemia, lipids are well controlled, 4 cm AAA. He has history of prostate cancer S/P radical prostatectomy in 2001 and presently on Lupron injections and in remission, history of 60 pack year history of cigarette smoking, quit in 2002 and thoracic aortic atherosclerosis by chest x-ray. He is here on a 12 month office visit and follow-up of AAA.  I reviewed the results of the AAA duplex, there is very mild progression of AAA.  We will recheck in 6 months and I will see him back then.  With regard to hypertension, although blood pressure was controlled, patient states that his blood pressure has not been well controlled at home.  Goal blood pressure 130/80 mmHg or less.  We will discontinue Avapro and switch him to Benicar 40 mg daily.  He will get a BMP in 2 to 3 weeks and will follow up with Ms. Lawerance Cruel, PA-C for hypertension management in 4 to 6 weeks.  Patient complains of continuous urinary dripping and incontinence since radical prostatectomy and has recently gotten worse.  He would like to get a second opinion, referral made to [facility].   Adrian Prows, MD, [facility] 12/31/2020, [place] PM Office: [phone] Pager: [phone]

## 2021-01-15 DIAGNOSIS — I10 Essential (primary) hypertension: Secondary | ICD-10-CM | POA: Diagnosis not present

## 2021-01-16 LAB — BASIC METABOLIC PANEL
BUN/Creatinine Ratio: 13 (ref 10–24)
BUN: 13 mg/dL (ref 8–27)
CO2: 22 mmol/L (ref 20–29)
Calcium: 9.6 mg/dL (ref 8.6–10.2)
Chloride: 105 mmol/L (ref 96–106)
Creatinine, Ser: 1.03 mg/dL (ref 0.76–1.27)
GFR calc Af Amer: 78 mL/min/{1.73_m2} (ref >59)
GFR calc non Af Amer: 68 mL/min/{1.73_m2} (ref >59)
Glucose: 107 mg/dL — ABNORMAL HIGH (ref 65–99)
Potassium: 3.8 mmol/L (ref 3.5–5.2)
Sodium: 145 mmol/L — ABNORMAL HIGH (ref 134–144)

## 2021-01-18 ENCOUNTER — Ambulatory Visit: Payer: Medicare HMO | Admitting: Cardiology

## 2021-01-31 DIAGNOSIS — C61 Malignant neoplasm of prostate: Secondary | ICD-10-CM | POA: Diagnosis not present

## 2021-02-07 NOTE — Progress Notes (Signed)
Primary Physician/Referring:  Haywood Pao, MD  Patient ID: Kevin Roup., male    DOB: 12-25-1940, 80 y.o.   MRN: [id number]  Chief Complaint  Patient presents with  . Hypertension  . Follow-up   HPI:    Kevin Pacheco.  is a 80 y.o. Afro-American male with history of hypertension and hyperlipidemia, lipids are well controlled, 4 cm AAA. He has history of prostate cancer and presently on Lupron injections and in remission, history of 60 pack year history of cigarette smoking, quit in 2002 and thoracic aortic atherosclerosis by chest x-ray.   He has had a low risk nuclear stress test in February 2017 although the EKG portion was abnormal which are suggestive ischemia and he had normal exercise tolerance. Echocardiogram at that time was essentially unremarkable  Patient presents for 6 week follow up of hypertension. At last visit discontinued Avapro and switched him to Benicar 40 mg daily, repeat BMP remained stable.  Patient is tolerating present medications without issue.  He states he does not check his blood pressure daily, however he does monitor it periodically at home.  States since switching to Benicar his pressures have averaged 125-130/70-75 mmHg.  Overall he is feeling well, not specific complaints today.  Denies chest pain, dyspnea, syncope, near syncope, leg swelling.  Past Medical History:  Diagnosis Date  . AAA (abdominal aortic aneurysm) ([place]) 12/15/2019   A moderate sized abdominal aortic aneurysm measuring 4.03 x 4.01 x 4.24 cm is seen in the mid aorta.  . Arthritis   . Bilateral carotid artery stenosis   . Essential hypertension 12/30/2018  . GERD (gastroesophageal reflux disease)   . H/O tobacco use, presenting hazards to health 12/30/2018   > 50-60 pack year quit 2012  . Headache(784.0)   . History of glaucoma   . Incomplete RBBB 12/29/2019   Noted on EKG  . Prostate cancer ([place])   . Pure hypercholesterolemia 12/30/2018  . Sickle cell trait ([place])    . Urinary incontinence    Family History  Problem Relation Age of Onset  . Heart disease Father   . Heart attack Father     Past Surgical History:  Procedure Laterality Date  . ARTHROSCOPY KNEE W/ DRILLING Left 05/01/10  . BLADDER TUMOR EXCISION  2009  . CARDIAC CATHETERIZATION     hx 1980's can't remember M D   . CATARACT EXTRACTION, BILATERAL    . COLONOSCOPY  2005, 2012  . FRACTURE SURGERY Right    ankle  . I & D KNEE WITH POLY EXCHANGE Left 03/14/2013   Procedure: total knee revision;  Surgeon: Vickey Huger, MD;  Location: [facility];  Service: Orthopedics;  Laterality: Left;  . JOINT REPLACEMENT Left 02/06/11  . KNEE ARTHROPLASTY Right 04/18/2020   Procedure: COMPUTER ASSISTED TOTAL KNEE ARTHROPLASTY;  Surgeon: Rod Can, MD;  Location: [facility];  Service: Orthopedics;  Laterality: Right;  . PROSTATECTOMY  2001  . urethal sling  2009   Social History   Tobacco Use  . Smoking status: Former Smoker    Packs/day: 1.00    Years: 40.00    Pack years: 40.00    Types: Cigarettes    Quit date: 12/24/2000    Years since quitting: 20.1  . Smokeless tobacco: Never Used  Substance Use Topics  . Alcohol use: Yes    Alcohol/week: 4.0 standard drinks    Types: 4 Cans of beer per week    ROS  Review of Systems  Constitutional: Negative for malaise/fatigue  and weight gain.  Cardiovascular: Negative for chest pain, claudication, dyspnea on exertion, leg swelling, near-syncope, orthopnea, palpitations, paroxysmal nocturnal dyspnea and syncope.  Respiratory: Negative for hemoptysis and shortness of breath.   Endocrine: Negative for cold intolerance.  Hematologic/Lymphatic: Does not bruise/bleed easily.  Musculoskeletal: Positive for joint pain (knees).  Gastrointestinal: Negative for hematochezia and melena.  Neurological: Negative for dizziness, headaches, light-headedness and weakness.   Objective  Blood pressure 134/74, pulse (!) 57, temperature 98.2 F (36.8 C), resp. rate 16,  height 5\' 9"  (1.753 m), weight 207 lb (93.9 kg), SpO2 99 %.  Vitals with BMI 02/12/2021 12/31/2020 04/19/2020  Height 5\' 9"  5\' 9"  -  Weight 207 lbs 208 lbs -  BMI 44.01 02.7 -  Systolic 253 664 403  Diastolic 74 70 71  Pulse 57 73 57     Physical Exam Constitutional:      Appearance: Normal appearance. He is well-developed.  Neck:     Thyroid: No thyromegaly.  Cardiovascular:     Rate and Rhythm: Normal rate and regular rhythm.     Pulses: Intact distal pulses.     Heart sounds: Normal heart sounds. No murmur heard. No gallop.      Comments: No leg edema, no JVD. Pulmonary:     Effort: Pulmonary effort is normal.     Breath sounds: Normal breath sounds.  Abdominal:     General: Bowel sounds are normal.     Palpations: Abdomen is soft.     Comments: Pulsatile abdominal mass, measures 4 cm. Non-tender  Musculoskeletal:     Cervical back: Neck supple.  Skin:    General: Skin is warm and dry.  Neurological:     General: No focal deficit present.     Mental Status: He is alert and oriented to person, place, and time.    Laboratory examination:   CMP Latest Ref Rng & Units 01/15/2021 04/19/2020 04/16/2020  Glucose 65 - 99 mg/dL 107(H) 143(H) 111(H)  BUN 8 - 27 mg/dL 13 19 18   Creatinine 0.76 - 1.27 mg/dL 1.03 0.93 1.15  Sodium 134 - 144 mmol/L 145(H) 137 142  Potassium 3.5 - 5.2 mmol/L 3.8 3.7 3.4(L)  Chloride 96 - 106 mmol/L 105 106 107  CO2 20 - 29 mmol/L 22 22 26   Calcium 8.6 - 10.2 mg/dL 9.6 8.3(L) 9.2  Total Protein 6.5 - 8.1 g/dL - - 7.2  Total Bilirubin 0.3 - 1.2 mg/dL - - 0.5  Alkaline Phos 38 - 126 U/L - - 63  AST 15 - 41 U/L - - 19  ALT 0 - 44 U/L - - 19   CBC Latest Ref Rng & Units 04/19/2020 04/16/2020 12/14/2015  WBC 4.0 - 10.5 K/uL 9.0 4.4 3.8(L)  Hemoglobin 13.0 - 17.0 g/dL 9.1(L) 11.3(L) 13.9  Hematocrit 39.0 - 52.0 % 27.5(L) 34.0(L) 40.8  Platelets 150 - 400 K/uL 153 195 178   Lipid Panel  No results found for: CHOL, TRIG, HDL, CHOLHDL, VLDL, LDLCALC,  LDLDIRECT HEMOGLOBIN A1C No results found for: HGBA1C, MPG TSH No results for input(s): TSH in the last 8760 hours.  External labs 10/27/2019:   Cholesterol, total 135.000 m 10/27/2019 HDL 45 MG/DL 10/27/2019 LDL 54.000 mg 10/27/2019 Triglycerides 181.000 10/27/2019  A1C 5.700 % 10/27/2019 Hemoglobin 10.900 g/ 10/27/2019  Creatinine, Serum 1.000 mg/ 10/27/2019 Potassium 4.000 mm 12/02/2019 ALT (SGPT) 12.000 U/L 12/02/2019  Medications and allergies  No Known Allergies   Current Outpatient Medications on File Prior to Visit  Medication Sig Dispense  Refill  . amLODipine (NORVASC) 10 MG tablet Take 1 tablet (10 mg total) by mouth daily. 90 tablet 3  . aspirin EC 81 MG tablet Take 81 mg by mouth daily. Swallow whole.    Marland Kitchen aspirin-sod bicarb-citric acid (ALKA-SELTZER) 325 MG TBEF tablet Take 650 mg by mouth every 6 (six) hours as needed (indigestion).    Marland Kitchen atorvastatin (LIPITOR) 20 MG tablet TAKE 1 TABLET EVERY DAY 90 tablet 3  . leuprolide (LUPRON) 7.5 MG injection Inject 7.5 mg into the muscle every 3 (three) months.    . metoprolol tartrate (LOPRESSOR) 25 MG tablet Take 25 mg by mouth 2 (two) times daily.     Marland Kitchen olmesartan (BENICAR) 40 MG tablet Take 1 tablet (40 mg total) by mouth daily. 90 tablet 3  . omeprazole (PRILOSEC) 40 MG capsule Take 40 mg by mouth daily with breakfast.     No current facility-administered medications on file prior to visit.    Radiology:  No results found.  Cardiac Studies:   Echocardiogram [01/10/2016]:  Left ventricle cavity is normal in size. Mild concentric hypertrophy of the left ventricle. Normal global wall motion. Normal diastolic filling pattern. Calculated EF 55%. Left atrial cavity is mildly dilated. Trace aortic regurgitation. Mild tricuspid regurgitation. No evidence of pulmonary hypertension.  Nuclear stress test [01/07/2016]:  1. The resting electrocardiogram demonstrated normal sinus rhythm, normal resting conduction and no resting  arrhythmias. The stress electrocardiogram was normal. Ther was 2 mm upsloping ST depression with exercise back to baseline at <1 minute into recovery. The patient performed treadmill exercise using a Bruce protocol, completing 6;48 minutes. The patient completed an estimated workload of 8.27 METS, of the maximum predicted heart rate. The stress test was terminated because of fatigue. 2. Myocardial perfusion imaging is normal. Overall left ventricular systolic function was normal without regional wall motion abnormalities. The left ventricular ejection fraction was normal visually but calculated at (44%). This is a low risk study.  Abdominal Aortic Duplex 12/14/2020: Moderate dilatation of the abdominal aorta is noted in the mid aorta. Mild plaque noted in the proximal, mid and distal aorta.  An abdominal aortic aneurysm measuring 4.1 x 4 x 4.36 cm is seen in mid aorta. Normal distal aorta. Bilateral common iliac arteries show aneurysmal dilatation measuring 1.4 to 1.5 cm. Normal velocity in the aorta and iliac vessels.  Compared to the study done on 12/18/2019, mild increase in the size of the aneurysm from 4.1 cm. Recheck in 6 months for stability.   EKG   EKG 12/31/2020: Normal sinus rhythm at rate of 58 bpm, left axis deviation, incomplete right bundle branch block.  No significant change from 12/29/2019.  Assessment     ICD-10-CM   1. Essential hypertension  I10    No orders of the defined types were placed in this encounter.   There are no discontinued medications.   Recommendations:   Kevin Pacheco.  is a 80 y.o. Afro-American male with history of hypertension and hyperlipidemia, lipids are well controlled, 4 cm AAA. He has history of prostate cancer S/P radical prostatectomy in 2001 and presently on Lupron injections and in remission, history of 60 pack year history of cigarette smoking, quit in 2002 and thoracic aortic atherosclerosis by chest x-ray.   Patient presents for 6  week follow up of hypertension.  Although his blood pressure is mildly elevated in the office today, he reports home blood pressures which have improved since switching to Benicar, averaging 125-130/70-75 mmHg.  Blood pressure is  now well controlled, will continue current antihypertensive medications.  Discussed with patient that his goal blood pressure would be 130/80 mmHg or less, he agrees to continue to monitor his blood pressure on a regular basis at home and notify our office if it does not remain at goal.  Patient's primary concern today continues to be urinary dripping and incontinence since radical prostatectomy.  At last visit Dr. Einar Gip referred patient to [facility], however he has not yet followed up with them.  Advised patient to call [facility] to reschedule appointment for second opinion.  At last visit AAA duplex had revealed very mild progression of AAA.  Patient will follow up with Dr. Einar Gip for this in 6 months as previously scheduled.   Alethia Berthold, PA-C 02/12/2021, 8:57 AM Office: [phone]

## 2021-02-12 ENCOUNTER — Ambulatory Visit: Payer: Medicare HMO | Admitting: Student

## 2021-02-12 ENCOUNTER — Other Ambulatory Visit: Payer: Self-pay

## 2021-02-12 ENCOUNTER — Encounter: Payer: Self-pay | Admitting: Student

## 2021-02-12 VITALS — BP 134/74 | HR 57 | Temp 98.2°F | Resp 16 | Ht 69.0 in | Wt 207.0 lb

## 2021-02-12 DIAGNOSIS — I10 Essential (primary) hypertension: Secondary | ICD-10-CM

## 2021-03-11 DIAGNOSIS — H401132 Primary open-angle glaucoma, bilateral, moderate stage: Secondary | ICD-10-CM | POA: Diagnosis not present

## 2021-03-18 ENCOUNTER — Other Ambulatory Visit: Payer: Self-pay | Admitting: Cardiology

## 2021-03-18 ENCOUNTER — Telehealth: Payer: Self-pay

## 2021-03-18 DIAGNOSIS — I10 Essential (primary) hypertension: Secondary | ICD-10-CM

## 2021-03-18 NOTE — Telephone Encounter (Signed)
Patient called this morning confuse on what cardiac medication he needs to be taking. Patient states Dr Einar Gip stopped him on a medication and started him on a new one. I went back and I think benicar is want he was switched to but patient wants to know what he was discontinue please advise

## 2021-03-18 NOTE — Telephone Encounter (Signed)
discontinued Avapro and switched him to Benicar 40 mg daily,

## 2021-03-18 NOTE — Telephone Encounter (Signed)
Patient is aware 

## 2021-04-24 DIAGNOSIS — M7631 Iliotibial band syndrome, right leg: Secondary | ICD-10-CM | POA: Diagnosis not present

## 2021-04-27 ENCOUNTER — Other Ambulatory Visit: Payer: Self-pay | Admitting: Cardiology

## 2021-05-09 DIAGNOSIS — C61 Malignant neoplasm of prostate: Secondary | ICD-10-CM | POA: Diagnosis not present

## 2021-05-29 DIAGNOSIS — R0609 Other forms of dyspnea: Secondary | ICD-10-CM | POA: Diagnosis not present

## 2021-05-29 DIAGNOSIS — M25471 Effusion, right ankle: Secondary | ICD-10-CM | POA: Diagnosis not present

## 2021-05-29 DIAGNOSIS — M25472 Effusion, left ankle: Secondary | ICD-10-CM | POA: Diagnosis not present

## 2021-05-29 DIAGNOSIS — H6983 Other specified disorders of Eustachian tube, bilateral: Secondary | ICD-10-CM | POA: Diagnosis not present

## 2021-06-17 ENCOUNTER — Other Ambulatory Visit: Payer: Medicare HMO

## 2021-06-17 DIAGNOSIS — H6501 Acute serous otitis media, right ear: Secondary | ICD-10-CM | POA: Diagnosis not present

## 2021-06-17 DIAGNOSIS — H6981 Other specified disorders of Eustachian tube, right ear: Secondary | ICD-10-CM | POA: Diagnosis not present

## 2021-06-18 ENCOUNTER — Other Ambulatory Visit: Payer: Self-pay

## 2021-06-18 ENCOUNTER — Ambulatory Visit: Payer: Medicare HMO

## 2021-06-18 DIAGNOSIS — I714 Abdominal aortic aneurysm, without rupture, unspecified: Secondary | ICD-10-CM

## 2021-06-27 ENCOUNTER — Encounter: Payer: Self-pay | Admitting: Cardiology

## 2021-06-27 ENCOUNTER — Ambulatory Visit: Payer: Medicare HMO | Admitting: Cardiology

## 2021-06-27 ENCOUNTER — Other Ambulatory Visit: Payer: Self-pay

## 2021-06-27 VITALS — BP 127/67 | HR 67 | Temp 98.0°F | Resp 17 | Ht 69.0 in | Wt 206.6 lb

## 2021-06-27 DIAGNOSIS — I714 Abdominal aortic aneurysm, without rupture, unspecified: Secondary | ICD-10-CM

## 2021-06-27 DIAGNOSIS — I10 Essential (primary) hypertension: Secondary | ICD-10-CM

## 2021-06-27 DIAGNOSIS — E7800 Pure hypercholesterolemia, unspecified: Secondary | ICD-10-CM | POA: Diagnosis not present

## 2021-06-27 NOTE — Progress Notes (Signed)
Primary Physician/Referring:  Haywood Pao, MD  Patient ID: Kevin Roup., male    DOB: January 02, 1941, 80 y.o.   MRN: [id number]  Chief Complaint  Patient presents with   AAA   Follow-up    6 months   HPI:    Kevin Pacheco.  is a 80 y.o. Afro-American male with history of hypertension and hyperlipidemia, lipids are well controlled, 4 cm AAA. He has history of prostate cancer and presently on Lupron injections and in remission, history of 60 pack year history of cigarette smoking, quit in 2002 and thoracic aortic atherosclerosis by chest x-ray.   He has had a low risk nuclear stress test in February 2017 although the EKG portion was abnormal which are suggestive ischemia and he had normal exercise tolerance. Echocardiogram at that time was essentially unremarkable  The patient presents today for 6 months follow up of AAA. The patient reports doing well overall and denies any abdominal pain. The patient is currently being followed by ENT for some decreased hearing in his right ear due to some fluid in his middle ear. He also reports some back pain over his left shoulder that began after he climbed a ladder 3 days ago to fix his gutters and is worse with movement. The patient has been scheduled to see [facility] on 07/03/2021 for evaluation of urinary incontinence that he has been having since a radical prostatectomy for prostate cancer.   The patient states that his blood pressure readings were good at home so her no longer monitors his BP at home. He is currently taking Benicar 40 mg, Metoprolol tartrate 25 mg BID, and Amlodipine 10 mg. He reports no known side effects from these medications and seems to be tolerating them well.   The patient does endorses dyspnea when he bends over to pick things up It is quickly relieved when he takes a short break and he can resume normal activity. He denies dyspnea when he push mows and when he climbed the ladder recently. The  patient has not been walking and states that he needs to get back to the [facility]. He denies SOB while sleeping and has no trouble with sleeping flat on his back.   The patient denies abdominal pain, chest pain, palpitations, syncope, dizziness, difficulty sleeping due to breathing, excessive fatigue, orthopnea, melena, hematochezia, or hematuria.   Past Medical History:  Diagnosis Date   AAA (abdominal aortic aneurysm) ([place]) 12/15/2019   A moderate sized abdominal aortic aneurysm measuring 4.03 x 4.01 x 4.24 cm is seen in the mid aorta.   Arthritis    Bilateral carotid artery stenosis    Essential hypertension 12/30/2018   GERD (gastroesophageal reflux disease)    H/O tobacco use, presenting hazards to health 12/30/2018   > 50-60 pack year quit 2012   Headache(784.0)    History of glaucoma    Incomplete RBBB 12/29/2019   Noted on EKG   Prostate cancer ([place])    Pure hypercholesterolemia 12/30/2018   Sickle cell trait ([place])    Urinary incontinence    Family History  Problem Relation Age of Onset   Heart disease Father    Heart attack Father    COPD Sister     Past Surgical History:  Procedure Laterality Date   ARTHROSCOPY KNEE W/ DRILLING Left 05/01/10   BLADDER TUMOR EXCISION  2009   CARDIAC CATHETERIZATION     hx 1980's can't remember M D    CATARACT EXTRACTION, BILATERAL  COLONOSCOPY  2005, 2012   FRACTURE SURGERY Right    ankle   I & D KNEE WITH POLY EXCHANGE Left 03/14/2013   Procedure: total knee revision;  Surgeon: Vickey Huger, MD;  Location: [facility];  Service: Orthopedics;  Laterality: Left;   JOINT REPLACEMENT Left 02/06/11   KNEE ARTHROPLASTY Right 04/18/2020   Procedure: COMPUTER ASSISTED TOTAL KNEE ARTHROPLASTY;  Surgeon: Rod Can, MD;  Location: [facility];  Service: Orthopedics;  Laterality: Right;   PROSTATECTOMY  2001   urethal sling  2009   Social History   Tobacco Use   Smoking status: Former    Packs/day: 1.00    Years: 40.00    Pack years: 40.00     Types: Cigarettes    Quit date: 12/24/2000    Years since quitting: 20.5   Smokeless tobacco: Never  Substance Use Topics   Alcohol use: Yes    Alcohol/week: 4.0 standard drinks    Types: 4 Cans of beer per week    ROS  Review of Systems  Constitutional: Negative for malaise/fatigue.  Cardiovascular:  Negative for chest pain, claudication, dyspnea on exertion, leg swelling, near-syncope, orthopnea, palpitations and syncope.  Respiratory:  Negative for shortness of breath and sleep disturbances due to breathing.   Musculoskeletal:  Positive for back pain (in left shoulder after climbing a ladder) and joint pain (knees). Negative for myalgias.  Gastrointestinal:  Negative for hematochezia and melena.  Genitourinary:  Positive for bladder incontinence.  Neurological:  Negative for dizziness.  Objective  Blood pressure 127/67, pulse 67, temperature 98 F (36.7 C), temperature source Temporal, resp. rate 17, height $RemoveBe'5\' 9"'YsISgeNak$  (1.753 m), weight 93.7 kg, SpO2 97 %.  Vitals with BMI 06/27/2021 02/12/2021 12/31/2020  Height $Remov'5\' 9"'qvdkdD$  $Remove'5\' 9"'tCLGQOW$  $RemoveB'5\' 9"'ouarogyF$   Weight 206 lbs 10 oz 207 lbs 208 lbs  BMI 30.5 32.35 57.3  Systolic 220 254 270  Diastolic 67 74 70  Pulse 67 57 73     Physical Exam Vitals reviewed.  Constitutional:      Appearance: Normal appearance. He is well-developed.  Neck:     Thyroid: No thyromegaly.     Vascular: No carotid bruit.  Cardiovascular:     Rate and Rhythm: Normal rate and regular rhythm.     Pulses: Normal pulses and intact distal pulses.     Heart sounds: Normal heart sounds. No murmur heard.   No gallop.     Comments: No leg edema, no JVD. Pulmonary:     Effort: Pulmonary effort is normal. No respiratory distress.     Breath sounds: Normal breath sounds. No wheezing, rhonchi or rales.  Abdominal:     Tenderness: There is no abdominal tenderness.     Comments: Pulsatile abdominal mass, measures 4 cm. Non-tender  Musculoskeletal:     Cervical back: Neck supple.     Right  lower leg: No edema.     Left lower leg: No edema.  Neurological:     General: No focal deficit present.     Mental Status: He is alert and oriented to person, place, and time.   Laboratory examination:   CMP Latest Ref Rng & Units 01/15/2021 04/19/2020 04/16/2020  Glucose 65 - 99 mg/dL 107(H) 143(H) 111(H)  BUN 8 - 27 mg/dL $Remove'13 19 18  'URAmUdo$ Creatinine 0.76 - 1.27 mg/dL 1.03 0.93 1.15  Sodium 134 - 144 mmol/L 145(H) 137 142  Potassium 3.5 - 5.2 mmol/L 3.8 3.7 3.4(L)  Chloride 96 - 106 mmol/L 105 106 107  CO2  20 - 29 mmol/L $RemoveB'22 22 26  'zMLJdojv$ Calcium 8.6 - 10.2 mg/dL 9.6 8.3(L) 9.2  Total Protein 6.5 - 8.1 g/dL - - 7.2  Total Bilirubin 0.3 - 1.2 mg/dL - - 0.5  Alkaline Phos 38 - 126 U/L - - 63  AST 15 - 41 U/L - - 19  ALT 0 - 44 U/L - - 19   CBC Latest Ref Rng & Units 04/19/2020 04/16/2020 12/14/2015  WBC 4.0 - 10.5 K/uL 9.0 4.4 3.8(L)  Hemoglobin 13.0 - 17.0 g/dL 9.1(L) 11.3(L) 13.9  Hematocrit 39.0 - 52.0 % 27.5(L) 34.0(L) 40.8  Platelets 150 - 400 K/uL 153 195 178   Lipid Panel  No results found for: CHOL, TRIG, HDL, CHOLHDL, VLDL, LDLCALC, LDLDIRECT HEMOGLOBIN A1C No results found for: HGBA1C, MPG TSH No results for input(s): TSH in the last 8760 hours.  External labs 10/27/2019:   Cholesterol, total 135.000 m 10/27/2019 HDL 45 MG/DL 10/27/2019 LDL 54.000 mg 10/27/2019 Triglycerides 181.000 10/27/2019  A1C 5.700 % 10/27/2019 Hemoglobin 10.900 g/ 10/27/2019  Creatinine, Serum 1.000 mg/ 10/27/2019 Potassium 4.000 mm 12/02/2019 ALT (SGPT) 12.000 U/L 12/02/2019  Labs on 05/06/2021 CMP: Sodium 140, Potassium 5.0, Cr 1.3, BUN 20, eGFR 64.1, AST 21, ALT 19, Alk Phos 81, glucose 102 CBC: Hemoglobin 12.6, HCT 37.7, MCV 93.5, WBC 4.65, Platelets 175 A1C: 5.8%  Labs on 05/29/2021 BMP: Sodium 140, Potassium 3.8, Cr 1.0, BUN 12, eGFR 86.8 BNP 131.9 Allergies  No Known Allergies    Medications Prior to Visit:   Outpatient Medications Prior to Visit  Medication Sig Dispense Refill   amLODipine  (NORVASC) 10 MG tablet TAKE 1 TABLET EVERY DAY 90 tablet 3   aspirin EC 81 MG tablet Take 81 mg by mouth daily. Swallow whole.     aspirin-sod bicarb-citric acid (ALKA-SELTZER) 325 MG TBEF tablet Take 650 mg by mouth every 6 (six) hours as needed (indigestion).     atorvastatin (LIPITOR) 20 MG tablet TAKE 1 TABLET EVERY DAY 90 tablet 3   fluticasone (FLONASE) 50 MCG/ACT nasal spray Place into the nose.     leuprolide (LUPRON) 7.5 MG injection Inject 7.5 mg into the muscle every 3 (three) months.     metoprolol tartrate (LOPRESSOR) 25 MG tablet Take 25 mg by mouth 2 (two) times daily.      olmesartan (BENICAR) 40 MG tablet Take 1 tablet (40 mg total) by mouth daily. 90 tablet 3   omeprazole (PRILOSEC) 40 MG capsule Take 40 mg by mouth daily with breakfast.     predniSONE (DELTASONE) 20 MG tablet Take 1 tablet by mouth daily.     No facility-administered medications prior to visit.     Final Medications at End of Visit    Current Meds  Medication Sig   amLODipine (NORVASC) 10 MG tablet TAKE 1 TABLET EVERY DAY   aspirin EC 81 MG tablet Take 81 mg by mouth daily. Swallow whole.   aspirin-sod bicarb-citric acid (ALKA-SELTZER) 325 MG TBEF tablet Take 650 mg by mouth every 6 (six) hours as needed (indigestion).   atorvastatin (LIPITOR) 20 MG tablet TAKE 1 TABLET EVERY DAY   fluticasone (FLONASE) 50 MCG/ACT nasal spray Place into the nose.   leuprolide (LUPRON) 7.5 MG injection Inject 7.5 mg into the muscle every 3 (three) months.   metoprolol tartrate (LOPRESSOR) 25 MG tablet Take 25 mg by mouth 2 (two) times daily.    olmesartan (BENICAR) 40 MG tablet Take 1 tablet (40 mg total) by mouth daily.   omeprazole (PRILOSEC)  40 MG capsule Take 40 mg by mouth daily with breakfast.   predniSONE (DELTASONE) 20 MG tablet Take 1 tablet by mouth daily.      Radiology:  No results found.  CT Chest Without Contrast 12/16/2017: Cardiovascular: There is no thoracic aortic aneurysm. There are foci of  calcification in the proximal visualized great vessels. Note that the right common and left common carotid arteries arise as a common trunk, an anatomic variant. There is atherosclerotic calcification in the aorta. There are foci coronary artery calcification evident. There is no pericardial effusion or pericardial thickening appreciable. 1.  No edema or consolidation.  No evident pleural effusion. 2.  No appreciable thoracic adenopathy. 3. Aortic atherosclerosis. Foci of great vessel and coronary artery calcification noted.  Cardiac Studies:   Echocardiogram  [01/10/2016]:  Left ventricle cavity is normal in size. Mild concentric hypertrophy of the left ventricle. Normal global wall motion. Normal diastolic filling pattern. Calculated EF 55%. Left atrial cavity is mildly dilated. Trace aortic regurgitation. Mild tricuspid regurgitation. No evidence of pulmonary hypertension.  Nuclear stress test  [01/07/2016]:  1. The resting electrocardiogram demonstrated normal sinus rhythm, normal resting conduction and no resting arrhythmias. The stress electrocardiogram was normal. Ther was 2 mm upsloping ST depression with exercise back to baseline at <1 minute into recovery. The patient performed treadmill exercise using a Bruce protocol, completing 6;48 minutes. The patient completed an estimated workload of 8.27 METS, of the maximum predicted heart rate. The stress test was terminated because of fatigue. 2. Myocardial perfusion imaging is normal. Overall left ventricular systolic function was normal without regional wall motion abnormalities. The left ventricular ejection fraction was normal visually but calculated at (44%). This is a low risk study.  Abdominal Aorta Duplex 06/18/2021:  Moderate dilatation of the abdominal aorta is noted in the mid aorta. Mild  plaque noted in the proximal, mid and distal aorta.  An abdominal aortic aneurysm measuring 4.04 x 4.07 x 4.13 cm is seen in mid aorta.  Normal distal aorta. Bilateral common iliac arteries show  aneurysmal dilatation measuring 1.3 to 1.4 cm. Normal velocity in the  aorta and iliac vessels.  Compared to the study done on 12/14/2020, no significant change. Recheck in 1 year.   EKG   EKG 06/27/2021: Normal sinus rhythm with frequent PACs (2) at a rate of 57. Leftward axis. Borderline LVH. Left atrial enlargement. Incomplete right bundle branch block.   EKG 12/31/2020: Normal sinus rhythm at rate of 58 bpm, left axis deviation, incomplete right bundle branch block.  No significant change from 12/29/2019.  Assessment     ICD-10-CM   1. Abdominal aortic aneurysm (AAA) >39 mm diameter (HCC)  I71.4     2. Essential hypertension  I10 EKG 12-Lead    3. Pure hypercholesterolemia  E78.00 Lipid Profile     No orders of the defined types were placed in this encounter.   There are no discontinued medications.   Recommendations:   Nicoles Sedlacek.  is a 80 y.o. Afro-American male with history of hypertension and hyperlipidemia, lipids are well controlled, 4 cm AAA. He has history of prostate cancer S/P radical prostatectomy in 2001 and presently on Lupron injections and in remission, history of 60 pack year history of cigarette smoking, quit in 2002 and thoracic aortic atherosclerosis by chest x-ray.   The patient presents for follow up of AAA. At last office visit, the AAA in the patient's mid aorta was found to be 4.1 x 4 x 4.36 cm. Most  recent ultrasound revealed the AAA to be stable in size. Repeat ultrasound in 1 year was ordered. Will continue to monitor. The patient is aware of signs and symptoms that would warrant urgent evaluation.    Blood pressure in the office is stable today at 127/67. The patient should continue his current medications and check his BP at home occasionally. Will continue to monitor and maintain optimal BP control.  The patient was encouraged to call the office if his dyspnea on exertion worsens.  Suspect de-conditioning. The patient was encouraged to begin walking again to build up his strength and exercise tolerance. Will continue to monitor the patient's symptoms.  The patient has not had his lipids check recently. Lipid panel order to be faxed to the patient's PCP.   The patient was advised to use caution with climbing ladders due to the risk of fall and injury. The patient voiced understanding.  Follow up in 6 months for HTN and AAA.    [place], PA-S 06/27/2021, 10:47 AM Office: [phone]  Patient seen in collaboration with [place] PA student Milligan. I personally reviewed records, independently interviewed and examined patient. I agreement with above plan and assessment.  Patient is overall stable from a cardiovascular standpoint.  We will continue to monitor AAA.  Blood pressure is well controlled.  Will obtain repeat lipid profile testing.  In regards to dyspnea, suspect this is multifactorial likely driven by age-related deconditioning.  We will continue to monitor closely, could consider echocardiogram and/or stress test in the future if warranted.   Alethia Berthold, PA-C 06/27/2021, 2:26 PM Office: [phone]

## 2021-07-02 DIAGNOSIS — E7800 Pure hypercholesterolemia, unspecified: Secondary | ICD-10-CM | POA: Diagnosis not present

## 2021-07-02 NOTE — Progress Notes (Signed)
Labs 07/02/2021:  Total cholesterol 142, triglycerides 124, HDL 52, LDL 65.  Non-HDL cholesterol 90.  Serum glucose 105 mg, BUN 12, creatinine 1.0, EGFR 71/86 mL, potassium 3.8, CMP otherwise normal.  BNP elevated at 131.9.  Labs 05/06/2021:  Hb 12.6/HCT 27.7, platelets 175, normal indicis.

## 2021-07-03 DIAGNOSIS — C61 Malignant neoplasm of prostate: Secondary | ICD-10-CM | POA: Diagnosis not present

## 2021-07-03 DIAGNOSIS — R972 Elevated prostate specific antigen [PSA]: Secondary | ICD-10-CM | POA: Diagnosis not present

## 2021-08-13 DIAGNOSIS — C61 Malignant neoplasm of prostate: Secondary | ICD-10-CM | POA: Diagnosis not present

## 2021-08-19 DIAGNOSIS — H6981 Other specified disorders of Eustachian tube, right ear: Secondary | ICD-10-CM | POA: Diagnosis not present

## 2021-08-19 DIAGNOSIS — H90A31 Mixed conductive and sensorineural hearing loss, unilateral, right ear with restricted hearing on the contralateral side: Secondary | ICD-10-CM | POA: Diagnosis not present

## 2021-08-19 DIAGNOSIS — H906 Mixed conductive and sensorineural hearing loss, bilateral: Secondary | ICD-10-CM | POA: Diagnosis not present

## 2021-09-04 DIAGNOSIS — Z9079 Acquired absence of other genital organ(s): Secondary | ICD-10-CM | POA: Diagnosis not present

## 2021-09-04 DIAGNOSIS — Z87891 Personal history of nicotine dependence: Secondary | ICD-10-CM | POA: Diagnosis not present

## 2021-09-04 DIAGNOSIS — C61 Malignant neoplasm of prostate: Secondary | ICD-10-CM | POA: Diagnosis not present

## 2021-09-04 DIAGNOSIS — Z79818 Long term (current) use of other agents affecting estrogen receptors and estrogen levels: Secondary | ICD-10-CM | POA: Diagnosis not present

## 2021-09-20 DIAGNOSIS — C61 Malignant neoplasm of prostate: Secondary | ICD-10-CM | POA: Diagnosis not present

## 2021-09-20 DIAGNOSIS — R918 Other nonspecific abnormal finding of lung field: Secondary | ICD-10-CM | POA: Diagnosis not present

## 2021-09-20 DIAGNOSIS — Z8546 Personal history of malignant neoplasm of prostate: Secondary | ICD-10-CM | POA: Diagnosis not present

## 2021-10-24 DIAGNOSIS — M85852 Other specified disorders of bone density and structure, left thigh: Secondary | ICD-10-CM | POA: Diagnosis not present

## 2021-10-24 DIAGNOSIS — M810 Age-related osteoporosis without current pathological fracture: Secondary | ICD-10-CM | POA: Diagnosis not present

## 2021-11-15 DIAGNOSIS — C61 Malignant neoplasm of prostate: Secondary | ICD-10-CM | POA: Diagnosis not present

## 2021-11-15 DIAGNOSIS — D573 Sickle-cell trait: Secondary | ICD-10-CM | POA: Diagnosis not present

## 2021-11-28 DIAGNOSIS — E7800 Pure hypercholesterolemia, unspecified: Secondary | ICD-10-CM | POA: Diagnosis not present

## 2021-11-28 DIAGNOSIS — Z125 Encounter for screening for malignant neoplasm of prostate: Secondary | ICD-10-CM | POA: Diagnosis not present

## 2021-11-28 DIAGNOSIS — I10 Essential (primary) hypertension: Secondary | ICD-10-CM | POA: Diagnosis not present

## 2021-11-28 DIAGNOSIS — R7301 Impaired fasting glucose: Secondary | ICD-10-CM | POA: Diagnosis not present

## 2021-12-03 ENCOUNTER — Other Ambulatory Visit: Payer: Self-pay | Admitting: Cardiology

## 2021-12-03 DIAGNOSIS — R82998 Other abnormal findings in urine: Secondary | ICD-10-CM | POA: Diagnosis not present

## 2021-12-03 DIAGNOSIS — I10 Essential (primary) hypertension: Secondary | ICD-10-CM | POA: Diagnosis not present

## 2021-12-03 DIAGNOSIS — E6609 Other obesity due to excess calories: Secondary | ICD-10-CM | POA: Diagnosis not present

## 2021-12-03 DIAGNOSIS — Z1331 Encounter for screening for depression: Secondary | ICD-10-CM | POA: Diagnosis not present

## 2021-12-03 DIAGNOSIS — E7800 Pure hypercholesterolemia, unspecified: Secondary | ICD-10-CM | POA: Diagnosis not present

## 2021-12-03 DIAGNOSIS — D692 Other nonthrombocytopenic purpura: Secondary | ICD-10-CM | POA: Diagnosis not present

## 2021-12-03 DIAGNOSIS — I6529 Occlusion and stenosis of unspecified carotid artery: Secondary | ICD-10-CM | POA: Diagnosis not present

## 2021-12-03 DIAGNOSIS — Z0000 Encounter for general adult medical examination without abnormal findings: Secondary | ICD-10-CM | POA: Diagnosis not present

## 2021-12-03 DIAGNOSIS — I700 Atherosclerosis of aorta: Secondary | ICD-10-CM | POA: Diagnosis not present

## 2021-12-03 DIAGNOSIS — I714 Abdominal aortic aneurysm, without rupture, unspecified: Secondary | ICD-10-CM | POA: Diagnosis not present

## 2021-12-03 DIAGNOSIS — Z1339 Encounter for screening examination for other mental health and behavioral disorders: Secondary | ICD-10-CM | POA: Diagnosis not present

## 2021-12-03 DIAGNOSIS — H6983 Other specified disorders of Eustachian tube, bilateral: Secondary | ICD-10-CM | POA: Diagnosis not present

## 2021-12-30 ENCOUNTER — Other Ambulatory Visit: Payer: Self-pay

## 2021-12-30 ENCOUNTER — Ambulatory Visit: Payer: Medicare HMO | Admitting: Cardiology

## 2021-12-30 ENCOUNTER — Encounter: Payer: Self-pay | Admitting: Cardiology

## 2021-12-30 VITALS — BP 142/76 | HR 82 | Temp 97.3°F | Resp 16 | Ht 69.0 in | Wt 212.0 lb

## 2021-12-30 DIAGNOSIS — I723 Aneurysm of iliac artery: Secondary | ICD-10-CM | POA: Diagnosis not present

## 2021-12-30 DIAGNOSIS — E7800 Pure hypercholesterolemia, unspecified: Secondary | ICD-10-CM | POA: Diagnosis not present

## 2021-12-30 DIAGNOSIS — I714 Abdominal aortic aneurysm, without rupture, unspecified: Secondary | ICD-10-CM

## 2021-12-30 DIAGNOSIS — I10 Essential (primary) hypertension: Secondary | ICD-10-CM | POA: Diagnosis not present

## 2021-12-30 MED ORDER — METOPROLOL TARTRATE 50 MG PO TABS: 25.0000 mg | ORAL_TABLET | Freq: Two times a day (BID) | ORAL | 3 refills | Status: DC

## 2021-12-30 NOTE — Progress Notes (Signed)
Primary Physician/Referring:  Haywood Pao, MD  Patient ID: Kevin Roup., male    DOB: [birth year], 81 y.o.   MRN: [id number]  Chief Complaint  Patient presents with   AAA   Hypertension   Follow-up    6 months   HPI:    Kevin Pacheco.  is a 81 y.o. Afro-American male with history of hypertension and hyperlipidemia, lipids are well controlled, 4 cm AAA. He has history of prostate cancer and presently on Lupron injections and in remission, history of 60 pack year history of cigarette smoking, quit in 2002 and thoracic aortic atherosclerosis by chest x-ray.   He has had a low risk nuclear stress test in February 2017 although the EKG portion was abnormal which are suggestive ischemia and he had normal exercise tolerance. Echocardiogram at that time was essentially unremarkable  The patient presents today for 6 months follow up of AAA.    The patient denies abdominal pain, chest pain, palpitations, syncope, dizziness, difficulty sleeping due to breathing, excessive fatigue, orthopnea, melena, hematochezia, or hematuria.   Past Medical History:  Diagnosis Date   AAA (abdominal aortic aneurysm) 12/15/2019   A moderate sized abdominal aortic aneurysm measuring 4.03 x 4.01 x 4.24 cm is seen in the mid aorta.   Arthritis    Bilateral carotid artery stenosis    Essential hypertension 12/30/2018   GERD (gastroesophageal reflux disease)    H/O tobacco use, presenting hazards to health 12/30/2018   > 50-60 pack year quit 2012   Headache(784.0)    History of glaucoma    Incomplete RBBB 12/29/2019   Noted on EKG   Prostate cancer ([place])    Pure hypercholesterolemia 12/30/2018   Sickle cell trait ([place])    Urinary incontinence    Family History  Problem Relation Age of Onset   Heart disease Father    Heart attack Father    COPD Sister     Past Surgical History:  Procedure Laterality Date   ARTHROSCOPY KNEE W/ DRILLING Left 05/01/10   BLADDER TUMOR EXCISION  2009    CARDIAC CATHETERIZATION     hx 1980's can't remember M D    CATARACT EXTRACTION, BILATERAL     COLONOSCOPY  2005, 2012   FRACTURE SURGERY Right    ankle   I & D KNEE WITH POLY EXCHANGE Left 03/14/2013   Procedure: total knee revision;  Surgeon: Vickey Huger, MD;  Location: [facility];  Service: Orthopedics;  Laterality: Left;   JOINT REPLACEMENT Left 02/06/11   KNEE ARTHROPLASTY Right 04/18/2020   Procedure: COMPUTER ASSISTED TOTAL KNEE ARTHROPLASTY;  Surgeon: Rod Can, MD;  Location: [facility];  Service: Orthopedics;  Laterality: Right;   PROSTATECTOMY  2001   urethal sling  2009   Social History   Tobacco Use   Smoking status: Former    Packs/day: 1.00    Years: 40.00    Pack years: 40.00    Types: Cigarettes    Quit date: 12/24/2000    Years since quitting: 21.0   Smokeless tobacco: Never  Substance Use Topics   Alcohol use: Yes    Alcohol/week: 4.0 standard drinks    Types: 4 Cans of beer per week    ROS  Review of Systems  Cardiovascular:  Negative for chest pain, dyspnea on exertion and leg swelling.  Objective  Blood pressure (!) 142/76, pulse 82, temperature (!) 97.3 F (36.3 C), temperature source Temporal, resp. rate 16, height $RemoveBe'5\' 9"'AldawMzSk$  (1.753 m), weight 212 lb (96.2  kg), SpO2 98 %.  Vitals with BMI 12/30/2021 12/30/2021 06/27/2021  Height - 5\' 9"  5\' 9"   Weight - 212 lbs 206 lbs 10 oz  BMI - 16.96 78.9  Systolic 381 017 510  Diastolic 76 69 67  Pulse - 82 67     Physical Exam Neck:     Vascular: No carotid bruit or JVD.  Cardiovascular:     Rate and Rhythm: Normal rate and regular rhythm.     Pulses: Intact distal pulses.     Heart sounds: Normal heart sounds. No murmur heard.   No gallop.  Pulmonary:     Effort: Pulmonary effort is normal.     Breath sounds: Normal breath sounds.  Abdominal:     General: Bowel sounds are normal.     Palpations: Abdomen is soft. There is pulsatile mass (measures 4 cm. Mild tendernes).  Musculoskeletal:     Right lower leg: No  edema.     Left lower leg: No edema.   Laboratory examination:   External labs 10/27/2019:   Labs 07/02/2021:  Total cholesterol 142, triglycerides 124, HDL 52, LDL 65.  Non-HDL cholesterol 90.  Serum glucose 105 mg, BUN 12, creatinine 1.0, EGFR 71/86 mL, potassium 3.8, CMP otherwise normal.  BNP elevated at 131.9. Allergies  No Known Allergies    Medications Prior to Visit:   Outpatient Medications Prior to Visit  Medication Sig Dispense Refill   amLODipine (NORVASC) 10 MG tablet TAKE 1 TABLET EVERY DAY 90 tablet 3   aspirin EC 81 MG tablet Take 81 mg by mouth daily. Swallow whole.     aspirin-sod bicarb-citric acid (ALKA-SELTZER) 325 MG TBEF tablet Take 650 mg by mouth every 6 (six) hours as needed (indigestion).     atorvastatin (LIPITOR) 20 MG tablet TAKE 1 TABLET EVERY DAY 90 tablet 3   leuprolide (LUPRON) 7.5 MG injection Inject 7.5 mg into the muscle every 3 (three) months.     olmesartan (BENICAR) 40 MG tablet TAKE 1 TABLET EVERY DAY (DISCONTINUE AVAPRO) 90 tablet 3   omeprazole (PRILOSEC) 40 MG capsule Take 40 mg by mouth daily with breakfast.     metoprolol tartrate (LOPRESSOR) 25 MG tablet Take 25 mg by mouth 2 (two) times daily.      amoxicillin-clavulanate (AUGMENTIN) 875-125 MG tablet Take 1 tablet by mouth 2 (two) times daily. (Patient not taking: Reported on 12/30/2021)     fluticasone (FLONASE) 50 MCG/ACT nasal spray Place into the nose.     predniSONE (DELTASONE) 20 MG tablet Take 1 tablet by mouth daily.     No facility-administered medications prior to visit.   Final Medications at End of Visit    Current Meds  Medication Sig   amLODipine (NORVASC) 10 MG tablet TAKE 1 TABLET EVERY DAY   aspirin EC 81 MG tablet Take 81 mg by mouth daily. Swallow whole.   aspirin-sod bicarb-citric acid (ALKA-SELTZER) 325 MG TBEF tablet Take 650 mg by mouth every 6 (six) hours as needed (indigestion).   atorvastatin (LIPITOR) 20 MG tablet TAKE 1 TABLET EVERY DAY   leuprolide  (LUPRON) 7.5 MG injection Inject 7.5 mg into the muscle every 3 (three) months.   olmesartan (BENICAR) 40 MG tablet TAKE 1 TABLET EVERY DAY (DISCONTINUE AVAPRO)   omeprazole (PRILOSEC) 40 MG capsule Take 40 mg by mouth daily with breakfast.   [DISCONTINUED] metoprolol tartrate (LOPRESSOR) 25 MG tablet Take 25 mg by mouth 2 (two) times daily.       Radiology:  CT Chest Without Contrast 12/16/2017: Cardiovascular: There is no thoracic aortic aneurysm. There are foci of calcification in the proximal visualized great vessels. Note that the right common and left common carotid arteries arise as a common trunk, an anatomic variant. There is atherosclerotic calcification in the aorta. There are foci coronary artery calcification evident. There is no pericardial effusion or pericardial thickening appreciable. 1.  No edema or consolidation.  No evident pleural effusion. 2.  No appreciable thoracic adenopathy. 3. Aortic atherosclerosis. Foci of great vessel and coronary artery calcification noted.  Cardiac Studies:   Echocardiogram  [01/10/2016]:  Left ventricle cavity is normal in size. Mild concentric hypertrophy of the left ventricle. Normal global wall motion. Normal diastolic filling pattern. Calculated EF 55%. Left atrial cavity is mildly dilated. Trace aortic regurgitation. Mild tricuspid regurgitation. No evidence of pulmonary hypertension.  Nuclear stress test  [01/07/2016]:  1. The resting electrocardiogram demonstrated normal sinus rhythm, normal resting conduction and no resting arrhythmias. The stress electrocardiogram was normal. Ther was 2 mm upsloping ST depression with exercise back to baseline at <1 minute into recovery. The patient performed treadmill exercise using a Bruce protocol, completing 6;48 minutes. The patient completed an estimated workload of 8.27 METS, of the maximum predicted heart rate. The stress test was terminated because of fatigue. 2. Myocardial perfusion  imaging is normal. Overall left ventricular systolic function was normal without regional wall motion abnormalities. The left ventricular ejection fraction was normal visually but calculated at (44%). This is a low risk study.  Abdominal Aorta Duplex 06/18/2021:  Moderate dilatation of the abdominal aorta is noted in the mid aorta. Mild  plaque noted in the proximal, mid and distal aorta.  An abdominal aortic aneurysm measuring 4.04 x 4.07 x 4.13 cm is seen in mid aorta. Normal distal aorta. Bilateral common iliac arteries show  aneurysmal dilatation measuring 1.3 to 1.4 cm. Normal velocity in the  aorta and iliac vessels.  Compared to the study done on 12/14/2020, no significant change. Recheck in 1 year.   CT angiogram chest and abdomen 09/20/2021: No evidence of recurrent or metastatic carcinoma within the chest,  abdomen, or pelvis. Mild increase in size of 4.3 cm infrarenal abdominal aortic aneurysm compared to 12/16/2017.  Recommend follow-up every 12 months and vascular consultation.  Stable 2.7 cm right common iliac artery aneurysm.  Aortic Atherosclerosis (ICD10-I70.0).   EKG   EKG 12/30/2021: Normal sinus rhythm at rate of 71 bpm, incomplete right bundle branch block.  Normal axis, no evidence of ischemia, normal EKG. no significant change from 06/27/2021  Assessment     ICD-10-CM   1. Abdominal aortic aneurysm (AAA) >39 mm diameter  I71.40 EKG 12-Lead    PCV AORTA DUPLEX    2. Primary hypertension  I10 EKG 12-Lead    metoprolol tartrate (LOPRESSOR) 50 MG tablet    3. Pure hypercholesterolemia  E78.00     4. Iliac artery aneurysm, right ([facility])  I72.3 PCV AORTA DUPLEX     Meds ordered this encounter  Medications   metoprolol tartrate (LOPRESSOR) 50 MG tablet    Sig: Take 0.5 tablets (25 mg total) by mouth 2 (two) times daily.    Dispense:  180 tablet    Refill:  3   Medications Discontinued During This Encounter  Medication Reason   fluticasone (FLONASE) 50 MCG/ACT  nasal spray    predniSONE (DELTASONE) 20 MG tablet    amoxicillin-clavulanate (AUGMENTIN) 875-125 MG tablet Completed Course   metoprolol tartrate (LOPRESSOR) 25 MG tablet Reorder  Recommendations:   Kevin Pacheco.  is a 81 y.o. Afro-American male with history of hypertension and hyperlipidemia, lipids are well controlled, 4 cm AAA. He has history of prostate cancer S/P radical prostatectomy in 2001 and presently on Lupron injections and in remission, history of 60 pack year history of cigarette smoking, quit in 2002 and thoracic aortic atherosclerosis by chest x-ray.   The patient presents for follow up of AAA. At last office visit, the AAA in the patient's mid aorta was found to be 4.1 x 4 x 4.36 cm.  I reviewed the CT angiogram of the chest and abdomen that was performed a vascular surgery calculated advertently referred to vascular surgeon AAA.  It appears to correlate well with the ultrasound findings.  However today he has mild discomfort in his abdomen, upon palpation, conserved like to repeat AAA duplex.  His blood pressure is also elevated today we will increase metoprolol tartrate from 25 to 50 mg twice daily.  Reviewed his external labs, cholesterol is well controlled.  From cardiac standpoint he is doing well without any clinical evidence of heart failure.  I would like to see him back in 6 weeks for follow-up of blood and also hypertension this was a 40-minute office visit encounter and discussions regarding his CT findings, patient was also confused regarding follow-up with multiple physicians and discussions regarding medications.      Adrian Prows, MD, [facility] 12/30/2021, 9:45 AM Office: [phone] Fax: [phone] Pager: [phone]

## 2022-01-06 DIAGNOSIS — Z79818 Long term (current) use of other agents affecting estrogen receptors and estrogen levels: Secondary | ICD-10-CM | POA: Diagnosis not present

## 2022-01-06 DIAGNOSIS — Z7983 Long term (current) use of bisphosphonates: Secondary | ICD-10-CM | POA: Diagnosis not present

## 2022-01-06 DIAGNOSIS — Z9079 Acquired absence of other genital organ(s): Secondary | ICD-10-CM | POA: Diagnosis not present

## 2022-01-06 DIAGNOSIS — M8580 Other specified disorders of bone density and structure, unspecified site: Secondary | ICD-10-CM | POA: Diagnosis not present

## 2022-01-06 DIAGNOSIS — C61 Malignant neoplasm of prostate: Secondary | ICD-10-CM | POA: Diagnosis not present

## 2022-01-06 DIAGNOSIS — M8589 Other specified disorders of bone density and structure, multiple sites: Secondary | ICD-10-CM | POA: Diagnosis not present

## 2022-01-06 DIAGNOSIS — Z923 Personal history of irradiation: Secondary | ICD-10-CM | POA: Diagnosis not present

## 2022-01-06 DIAGNOSIS — Z192 Hormone resistant malignancy status: Secondary | ICD-10-CM | POA: Diagnosis not present

## 2022-02-04 ENCOUNTER — Ambulatory Visit: Payer: Medicare HMO

## 2022-02-04 ENCOUNTER — Other Ambulatory Visit: Payer: Self-pay

## 2022-02-04 DIAGNOSIS — I714 Abdominal aortic aneurysm, without rupture, unspecified: Secondary | ICD-10-CM | POA: Diagnosis not present

## 2022-02-04 DIAGNOSIS — I723 Aneurysm of iliac artery: Secondary | ICD-10-CM

## 2022-02-10 ENCOUNTER — Encounter: Payer: Self-pay | Admitting: Cardiology

## 2022-02-10 ENCOUNTER — Ambulatory Visit: Payer: Medicare HMO | Admitting: Cardiology

## 2022-02-10 ENCOUNTER — Telehealth: Payer: Self-pay

## 2022-02-10 NOTE — Telephone Encounter (Signed)
I sent him mychart message. Nothing to be done, probably related to stomach issues and will discuss on OV soon

## 2022-02-11 NOTE — Telephone Encounter (Signed)
Called pt to inform him about the message above. Pt understood

## 2022-02-13 ENCOUNTER — Other Ambulatory Visit: Payer: Self-pay

## 2022-02-13 ENCOUNTER — Encounter: Payer: Self-pay | Admitting: Cardiology

## 2022-02-13 ENCOUNTER — Ambulatory Visit: Payer: Medicare HMO | Admitting: Cardiology

## 2022-02-13 VITALS — BP 123/61 | HR 57 | Resp 16 | Ht 69.0 in | Wt 212.0 lb

## 2022-02-13 DIAGNOSIS — I714 Abdominal aortic aneurysm, without rupture, unspecified: Secondary | ICD-10-CM | POA: Diagnosis not present

## 2022-02-13 DIAGNOSIS — I10 Essential (primary) hypertension: Secondary | ICD-10-CM

## 2022-02-13 DIAGNOSIS — I723 Aneurysm of iliac artery: Secondary | ICD-10-CM

## 2022-02-13 DIAGNOSIS — E7800 Pure hypercholesterolemia, unspecified: Secondary | ICD-10-CM | POA: Diagnosis not present

## 2022-02-13 NOTE — Progress Notes (Signed)
? ?Primary Physician/Referring:  Pacheco, Kevin Him, MD ? ?Patient ID: Kevin Pacheco., male    DOB: 08-11-1941, 81 y.o.   MRN: [id number] ? ?Chief Complaint  ?Patient presents with  ? Hypertension  ? AAA  ? Follow-up  ? ?HPI:   ? ?Kevin Pacheco.  is a 81 y.o. Afro-American male with history of hypertension and hyperlipidemia, lipids are well controlled, 4 cm AAA. He has history of prostate cancer and presently on Lupron injections and in remission, history of 60 pack year history of cigarette smoking, quit in 2002 and thoracic aortic atherosclerosis by chest x-ray.  ? ?He has had a low risk nuclear stress test in February 2017 although the EKG portion was abnormal which are suggestive ischemia and he had normal exercise tolerance. Echocardiogram at that time was essentially unremarkable ? ?The patient presents today for 6 weeks follow up of AAA.  Due to mild abdominal discomfort I repeated duplex of the abdominal aorta.  The patient denies abdominal pain, chest pain, palpitations, syncope, dizziness, difficulty sleeping due to breathing, excessive fatigue, orthopnea, melena, hematochezia, or hematuria.  ? ?Past Medical History:  ?Diagnosis Date  ? AAA (abdominal aortic aneurysm) 12/15/2019  ? A moderate sized abdominal aortic aneurysm measuring 4.03 x 4.01 x 4.24 cm is seen in the mid aorta.  ? Arthritis   ? Bilateral carotid artery stenosis   ? Essential hypertension 12/30/2018  ? GERD (gastroesophageal reflux disease)   ? H/O tobacco use, presenting hazards to health 12/30/2018  ? > 50-60 pack year quit 2012  ? Headache(784.0)   ? History of glaucoma   ? Incomplete RBBB 12/29/2019  ? Noted on EKG  ? Prostate cancer ([place])   ? Pure hypercholesterolemia 12/30/2018  ? Sickle cell trait ([place])   ? Urinary incontinence   ? ? ?Social History  ? ?Tobacco Use  ? Smoking status: Former  ?  Packs/day: 1.00  ?  Years: 40.00  ?  Pack years: 40.00  ?  Types: Cigarettes  ?  Quit date: 12/24/2000  ?  Years since quitting:  21.1  ? Smokeless tobacco: Never  ?Substance Use Topics  ? Alcohol use: Yes  ?  Alcohol/week: 4.0 standard drinks  ?  Types: 4 Cans of beer per week  ? ? ?ROS  ?Review of Systems  ?Cardiovascular:  Negative for chest pain, dyspnea on exertion and leg swelling.  ?Objective  ?Blood pressure 123/61, pulse (!) 57, resp. rate 16, height $RemoveBe'5\' 9"'cKceYacbN$  (1.753 m), weight 212 lb (96.2 kg), SpO2 98 %.  ? ?  02/13/2022  ? 11:29 AM 12/30/2021  ?  9:23 AM 12/30/2021  ?  8:54 AM  ?Vitals with BMI  ?Height $Remove'5\' 9"'AKmcNiV$   '5\' 9"'$   ?Weight 212 lbs  212 lbs  ?BMI 31.29  31.29  ?Systolic 628 315 176  ?Diastolic 61 76 69  ?Pulse 57  82  ?  ? Physical Exam ?Neck:  ?   Vascular: No carotid bruit or JVD.  ?Cardiovascular:  ?   Rate and Rhythm: Normal rate and regular rhythm.  ?   Pulses: Intact distal pulses.  ?   Heart sounds: Normal heart sounds. No murmur heard. ?  No gallop.  ?Pulmonary:  ?   Effort: Pulmonary effort is normal.  ?   Breath sounds: Normal breath sounds.  ?Abdominal:  ?   General: Bowel sounds are normal.  ?   Palpations: Abdomen is soft. There is pulsatile mass (measures 4 cm. Mild tendernes).  ?Musculoskeletal:  ?  Right lower leg: No edema.  ?   Left lower leg: No edema.  ? ?Laboratory examination:  ? ?External labs 10/27/2019:  ? ?Labs 07/02/2021: ? ?Total cholesterol 142, triglycerides 124, HDL 52, LDL 65.  Non-HDL cholesterol 90. ? ?Serum glucose 105 mg, BUN 12, creatinine 1.0, EGFR 71/86 mL, potassium 3.8, CMP otherwise normal. ? ?BNP elevated at 131.9. ?Allergies  ?No Known Allergies  ? ?Final Medications at End of Visit   ? ? ?Current Outpatient Medications:  ?  alendronate (FOSAMAX) 70 MG tablet, Take 70 mg by mouth once a week., Disp: , Rfl:  ?  amLODipine (NORVASC) 10 MG tablet, TAKE 1 TABLET EVERY DAY, Disp: 90 tablet, Rfl: 3 ?  aspirin EC 81 MG tablet, Take 81 mg by mouth daily. Swallow whole., Disp: , Rfl:  ?  aspirin-sod bicarb-citric acid (ALKA-SELTZER) 325 MG TBEF tablet, Take 650 mg by mouth every 6 (six) hours as needed  (indigestion)., Disp: , Rfl:  ?  atorvastatin (LIPITOR) 20 MG tablet, TAKE 1 TABLET EVERY DAY, Disp: 90 tablet, Rfl: 3 ?  leuprolide (LUPRON) 7.5 MG injection, Inject 7.5 mg into the muscle every 3 (three) months., Disp: , Rfl:  ?  olmesartan (BENICAR) 40 MG tablet, TAKE 1 TABLET EVERY DAY (DISCONTINUE AVAPRO), Disp: 90 tablet, Rfl: 3 ?  omeprazole (PRILOSEC) 40 MG capsule, Take 40 mg by mouth daily with breakfast., Disp: , Rfl:  ?  metoprolol tartrate (LOPRESSOR) 50 MG tablet, Take 1 tablet (50 mg total) by mouth 2 (two) times daily., Disp: , Rfl:    ?Radiology:  ? ?CT Chest Without Contrast 12/16/2017: ?Cardiovascular: There is no thoracic aortic aneurysm. There are foci ?of calcification in the proximal visualized great vessels. Note that ?the right common and left common carotid arteries arise as a common ?trunk, an anatomic variant. There is atherosclerotic calcification ?in the aorta. There are foci coronary artery calcification evident. ?There is no pericardial effusion or pericardial thickening appreciable. ?1.  No edema or consolidation.  No evident pleural effusion. ?2.  No appreciable thoracic adenopathy. ?3. Aortic atherosclerosis. Foci of great vessel and coronary artery calcification noted. ? ?Cardiac Studies:  ? ?Echocardiogram  [01/10/2016]:  ?Left ventricle cavity is normal in size. Mild concentric hypertrophy of the left ventricle. Normal global wall motion. Normal diastolic filling pattern. Calculated EF 55%. ?Left atrial cavity is mildly dilated. ?Trace aortic regurgitation. ?Mild tricuspid regurgitation. No evidence of pulmonary hypertension. ? ?Nuclear stress test  [01/07/2016]:  ?1. The resting electrocardiogram demonstrated normal sinus rhythm, normal resting conduction and no resting arrhythmias. The stress electrocardiogram was normal. Ther was 2 mm upsloping ST depression with exercise back to baseline at <1 minute into recovery. The patient performed treadmill exercise using a Bruce  protocol, completing 6;48 minutes. The patient completed an estimated workload of 8.27 METS, of the maximum predicted heart rate. The stress test was terminated because of fatigue. ?2. Myocardial perfusion imaging is normal. Overall left ventricular systolic function was normal without regional wall motion abnormalities. The left ventricular ejection fraction was normal visually but calculated at (44%). This is a low risk study. ? ?CT angiogram chest and abdomen 09/20/2021: ?No evidence of recurrent or metastatic carcinoma within the chest,  ?abdomen, or pelvis. Mild increase in size of 4.3 cm infrarenal abdominal aortic aneurysm compared to 12/16/2017.  ?Recommend follow-up every 12 months and vascular consultation.  ?Stable 2.7 cm right common iliac artery aneurysm.  ?Aortic Atherosclerosis (ICD10-I70.0).  ? ?Abdominal Aortic Duplex 02/04/2022: ?Moderate dilatation of the abdominal aorta  is noted in the mid and distal aorta. An abdominal aortic aneurysm measuring 4.1 x 4.1 x 4.9 cm is seen, with bilateral iliac artery involvement. ?Right CIA measures 3.3x2.9x3.3 cm and left 1.8x1.9x2.0 cm.  ?Mild diffuse plaque noted in the distal aorta.   ?Compared to the study done on 06/18/2021, abdominal aortic aneurysm size has increased from 4.0 to 4.9 cm in the distal abdominal aorta and bilateral iliac aneurysm size has increased from 1.4 cm.  Consider surgical consultation. ? ?EKG  ? ?EKG 12/30/2021: Normal sinus rhythm at rate of 71 bpm, incomplete right bundle branch block.  Normal axis, no evidence of ischemia, normal EKG. no significant change from 06/27/2021 ? ?Assessment  ? ?  ICD-10-CM   ?1. Abdominal aortic aneurysm (AAA) 3.0 cm to 5.0 cm in diameter in male  I71.40 Ambulatory referral to Vascular Surgery  ?  metoprolol tartrate (LOPRESSOR) 50 MG tablet  ?  ?2. Iliac artery aneurysm, right ([place])  I72.3 Ambulatory referral to Vascular Surgery  ?  ?3. Primary hypertension  I10 metoprolol tartrate (LOPRESSOR) 50 MG  tablet  ?  ?4. Pure hypercholesterolemia  E78.00   ?  ? ?No orders of the defined types were placed in this encounter. ? ?Medications Discontinued During This Encounter  ?Medication Reason  ? metoprolol tartrate (

## 2022-02-21 DIAGNOSIS — C61 Malignant neoplasm of prostate: Secondary | ICD-10-CM | POA: Diagnosis not present

## 2022-02-25 ENCOUNTER — Ambulatory Visit: Payer: Medicare HMO | Admitting: Vascular Surgery

## 2022-02-25 ENCOUNTER — Encounter: Payer: Self-pay | Admitting: Vascular Surgery

## 2022-02-25 VITALS — BP 126/72 | HR 48 | Temp 97.9°F | Resp 16 | Ht 69.0 in | Wt 210.0 lb

## 2022-02-25 DIAGNOSIS — I714 Abdominal aortic aneurysm, without rupture, unspecified: Secondary | ICD-10-CM | POA: Diagnosis not present

## 2022-02-25 NOTE — Progress Notes (Signed)
? ? ?Patient name: Kevin Pacheco. MRN: [id number] DOB: Jan 01, 1941 Sex: male ? ?REASON FOR CONSULT: AAA ? ?HPI: ?Kevin Jeff. is a 81 y.o. male, with history of hypertension, hyperlipidemia, prostate cancer, previous tobacco abuse (quit 2002) referred for evaluation of abdominal aortic aneurysm.  Patient states he has known about his aneurysm for at least the last 10 years.  This has been followed by Dr. Einar Gip. His AAA duplex on 06/18/2021 to 02/04/2022 showed that his abdominal aortic aneurysm increased from 4.0 to 4.9 cm.  He also has bilateral iliac artery aneurysms.  The right measures 3.3 cm and the left measures 2.0 cm by duplex as well.  Reports no new abdominal pain but has some chronic back pain.  History of radical prostatectomy for prostate cancer as far as abdominal surgery. ? ?Past Medical History:  ?Diagnosis Date  ? AAA (abdominal aortic aneurysm) ([place]) 12/15/2019  ? A moderate sized abdominal aortic aneurysm measuring 4.03 x 4.01 x 4.24 cm is seen in the mid aorta.  ? Arthritis   ? Bilateral carotid artery stenosis   ? Essential hypertension 12/30/2018  ? GERD (gastroesophageal reflux disease)   ? H/O tobacco use, presenting hazards to health 12/30/2018  ? > 50-60 pack year quit 2012  ? Headache(784.0)   ? History of glaucoma   ? Incomplete RBBB 12/29/2019  ? Noted on EKG  ? Prostate cancer ([place])   ? Pure hypercholesterolemia 12/30/2018  ? Sickle cell trait ([place])   ? Urinary incontinence   ? ? ?Past Surgical History:  ?Procedure Laterality Date  ? ARTHROSCOPY KNEE W/ DRILLING Left 05/01/10  ? BLADDER TUMOR EXCISION  2009  ? CARDIAC CATHETERIZATION    ? hx 1980's can't remember M D   ? CATARACT EXTRACTION, BILATERAL    ? COLONOSCOPY  2005, 2012  ? FRACTURE SURGERY Right   ? ankle  ? I & D KNEE WITH POLY EXCHANGE Left 03/14/2013  ? Procedure: total knee revision;  Surgeon: Vickey Huger, MD;  Location: [facility];  Service: Orthopedics;  Laterality: Left;  ? JOINT REPLACEMENT Left 02/06/11  ? KNEE  ARTHROPLASTY Right 04/18/2020  ? Procedure: COMPUTER ASSISTED TOTAL KNEE ARTHROPLASTY;  Surgeon: Rod Can, MD;  Location: [facility];  Service: Orthopedics;  Laterality: Right;  ? PROSTATECTOMY  2001  ? urethal sling  2009  ? ? ?Family History  ?Problem Relation Age of Onset  ? Heart disease Father   ? Heart attack Father   ? COPD Sister   ? ? ?SOCIAL HISTORY: ?Social History  ? ?Socioeconomic History  ? Marital status: Married  ?  Spouse name: Not on file  ? Number of children: 4  ? Years of education: Not on file  ? Highest education level: Not on file  ?Occupational History  ? Not on file  ?Tobacco Use  ? Smoking status: Former  ?  Packs/day: 1.00  ?  Years: 40.00  ?  Pack years: 40.00  ?  Types: Cigarettes  ?  Quit date: 12/24/2000  ?  Years since quitting: 21.1  ? Smokeless tobacco: Never  ?Vaping Use  ? Vaping Use: Never used  ?Substance and Sexual Activity  ? Alcohol use: Yes  ?  Alcohol/week: 4.0 standard drinks  ?  Types: 4 Cans of beer per week  ? Drug use: No  ? Sexual activity: Not on file  ?Other Topics Concern  ? Not on file  ?Social History Narrative  ? ** Merged History Encounter **  ?    ? ?  Social Determinants of Health  ? ?Financial Resource Strain: Not on file  ?Food Insecurity: Not on file  ?Transportation Needs: Not on file  ?Physical Activity: Not on file  ?Stress: Not on file  ?Social Connections: Not on file  ?Intimate Partner Violence: Not on file  ? ? ?No Known Allergies ? ?Current Outpatient Medications  ?Medication Sig Dispense Refill  ? alendronate (FOSAMAX) 70 MG tablet Take 70 mg by mouth once a week.    ? amLODipine (NORVASC) 10 MG tablet TAKE 1 TABLET EVERY DAY 90 tablet 3  ? aspirin EC 81 MG tablet Take 81 mg by mouth daily. Swallow whole.    ? aspirin-sod bicarb-citric acid (ALKA-SELTZER) 325 MG TBEF tablet Take 650 mg by mouth every 6 (six) hours as needed (indigestion).    ? atorvastatin (LIPITOR) 20 MG tablet TAKE 1 TABLET EVERY DAY 90 tablet 3  ? leuprolide (LUPRON) 7.5 MG  injection Inject 7.5 mg into the muscle every 3 (three) months.    ? metoprolol tartrate (LOPRESSOR) 50 MG tablet Take 1 tablet (50 mg total) by mouth 2 (two) times daily.    ? olmesartan (BENICAR) 40 MG tablet TAKE 1 TABLET EVERY DAY (DISCONTINUE AVAPRO) 90 tablet 3  ? omeprazole (PRILOSEC) 40 MG capsule Take 40 mg by mouth daily with breakfast.    ? ?No current facility-administered medications for this visit.  ? ? ?REVIEW OF SYSTEMS:  ?'[X]'$  denotes positive finding, '[ ]'$  denotes negative finding ?Cardiac  Comments:  ?Chest pain or chest pressure:    ?Shortness of breath upon exertion:    ?Short of breath when lying flat:    ?Irregular heart rhythm:    ?    ?Vascular    ?Pain in calf, thigh, or hip brought on by ambulation:    ?Pain in feet at night that wakes you up from your sleep:     ?Blood clot in your veins:    ?Leg swelling:     ?    ?Pulmonary    ?Oxygen at home:    ?Productive cough:     ?Wheezing:     ?    ?Neurologic    ?Sudden weakness in arms or legs:     ?Sudden numbness in arms or legs:     ?Sudden onset of difficulty speaking or slurred speech:    ?Temporary loss of vision in one eye:     ?Problems with dizziness:     ?    ?Gastrointestinal    ?Blood in stool:     ?Vomited blood:     ?    ?Genitourinary    ?Burning when urinating:     ?Blood in urine:    ?    ?Psychiatric    ?Major depression:     ?    ?Hematologic    ?Bleeding problems:    ?Problems with blood clotting too easily:    ?    ?Skin    ?Rashes or ulcers:    ?    ?Constitutional    ?Fever or chills:    ? ? ?PHYSICAL EXAM: ?Vitals:  ? 02/25/22 1024  ?BP: 126/72  ?Pulse: (!) 48  ?Resp: 16  ?Temp: 97.9 ?F (36.6 ?C)  ?TempSrc: Temporal  ?SpO2: 96%  ?Weight: 210 lb (95.3 kg)  ?Height: '5\' 9"'$  (1.753 m)  ? ? ?GENERAL: The patient is a well-nourished male, in no acute distress. The vital signs are documented above. ?CARDIAC: There is a regular rate and rhythm.  ?VASCULAR:  ?  Palpable femoral pulses bilaterally ?Palpable DP pulses  bilaterally ?PULMONARY: No respiratory distress. ?ABDOMEN: Soft and non-tender. ?MUSCULOSKELETAL: There are no major deformities or cyanosis. ?NEUROLOGIC: No focal weakness or paresthesias are detected. ?SKIN: There are no ulcers or rashes noted. ?PSYCHIATRIC: The patient has a normal affect. ? ?DATA:  ? ?AAA duplex 02/09/2022 concerning for increase in abdominal aortic's aneurysm size from 4.0 to 4.9 cm with bilateral common iliac aneurysms. ? ?Assessment/Plan: ? ?81 year old male referred for evaluation of abdominal aortic aneurysm.  His most recent duplex with Dr. Einar Gip on 02/04/2022 suggested increased from 4 to 4.9 cm in the distal abdominal aorta and this was compared to a duplex from 06/18/2021.  I discussed abdominal aortic aneurysm disease in detail with the patient.  Also discussed that typical guidelines for repair in men are greater than 5.5 cm.  There is some concern when there is rapid growth and I recommended a CTA to further evaluate his abdominal aortic aneurysm.  It does appear in [place] he had a CT scan back in October in [facility] that suggest that his AAA was 4.3 cm so updated imaging may give [place] a better idea if there truly is rapid growth.  If no rapid growth, we will likely continue observation until it is 5.5 cm.  I will have him see me after CT scan is complete. ? ? ?Marty Heck, MD ?[facility] ?Office: [phone] ? ? ? ? ?

## 2022-03-03 ENCOUNTER — Other Ambulatory Visit: Payer: Self-pay

## 2022-03-03 DIAGNOSIS — I714 Abdominal aortic aneurysm, without rupture, unspecified: Secondary | ICD-10-CM

## 2022-03-06 ENCOUNTER — Other Ambulatory Visit: Payer: Self-pay

## 2022-03-10 ENCOUNTER — Ambulatory Visit (HOSPITAL_COMMUNITY)
Admission: RE | Admit: 2022-03-10 | Discharge: 2022-03-10 | Disposition: A | Discharge status: Home / Self Care | Payer: Medicare HMO | Source: Ambulatory Visit | Attending: Vascular Surgery | Admitting: Vascular Surgery

## 2022-03-10 DIAGNOSIS — I700 Atherosclerosis of aorta: Secondary | ICD-10-CM | POA: Diagnosis not present

## 2022-03-10 DIAGNOSIS — I714 Abdominal aortic aneurysm, without rupture, unspecified: Secondary | ICD-10-CM

## 2022-03-10 DIAGNOSIS — I7143 Infrarenal abdominal aortic aneurysm, without rupture: Secondary | ICD-10-CM | POA: Diagnosis not present

## 2022-03-10 MED ORDER — SODIUM CHLORIDE (PF) 0.9 % IJ SOLN
INTRAMUSCULAR | Status: AC
  Filled 2022-03-10: qty 50

## 2022-03-10 MED ORDER — IOHEXOL 350 MG/ML SOLN
100.0000 mL | Freq: Once | INTRAVENOUS | Status: AC | PRN
  Administered 2022-03-10: 100 mL via INTRAVENOUS

## 2022-03-20 DIAGNOSIS — H6981 Other specified disorders of Eustachian tube, right ear: Secondary | ICD-10-CM | POA: Diagnosis not present

## 2022-03-25 ENCOUNTER — Encounter: Payer: Self-pay | Admitting: Vascular Surgery

## 2022-03-25 ENCOUNTER — Ambulatory Visit: Payer: Medicare HMO | Admitting: Vascular Surgery

## 2022-03-25 VITALS — BP 139/67 | HR 51 | Temp 97.7°F | Resp 20 | Ht 69.0 in | Wt 212.5 lb

## 2022-03-25 DIAGNOSIS — I723 Aneurysm of iliac artery: Secondary | ICD-10-CM | POA: Diagnosis not present

## 2022-03-25 DIAGNOSIS — I714 Abdominal aortic aneurysm, without rupture, unspecified: Secondary | ICD-10-CM | POA: Diagnosis not present

## 2022-03-25 NOTE — Progress Notes (Signed)
? ? ?Patient name: Kevin Pacheco. MRN: [id number] DOB: Dec 21, 1940 Sex: male ? ?REASON FOR CONSULT: F/U after CTA for evaluation of enlarging AAA ? ?HPI: ?Kevin Pacheco. is a 81 y.o. male, with history of hypertension, hyperlipidemia, prostate cancer, previous tobacco abuse (quit 2002) referred for evaluation of enlarging abdominal aortic aneurysm.  Patient had known about his aneurysm for at least the last 10 years.  This has been followed by Dr. Einar Gip. His AAA duplex on 06/18/2021 to 02/04/2022 showed that his abdominal aortic aneurysm increased from 4.0 to 4.9 cm.  He also has bilateral iliac artery aneurysms and the right was larger measuring 3.3 cm and the left measured 2.0 cm by duplex as well.   ? ?I sent him for CTA for further evaluation and he presents today for further follow-up.  No new abdominal pain since last evaluation.  Previous history of prostatectomy.   ? ?Past Medical History:  ?Diagnosis Date  ? AAA (abdominal aortic aneurysm) ([place]) 12/15/2019  ? A moderate sized abdominal aortic aneurysm measuring 4.03 x 4.01 x 4.24 cm is seen in the mid aorta.  ? Arthritis   ? Bilateral carotid artery stenosis   ? Essential hypertension 12/30/2018  ? GERD (gastroesophageal reflux disease)   ? H/O tobacco use, presenting hazards to health 12/30/2018  ? > 50-60 pack year quit 2012  ? Headache(784.0)   ? History of glaucoma   ? Incomplete RBBB 12/29/2019  ? Noted on EKG  ? Prostate cancer ([place])   ? Pure hypercholesterolemia 12/30/2018  ? Sickle cell trait ([place])   ? Urinary incontinence   ? ? ?Past Surgical History:  ?Procedure Laterality Date  ? ARTHROSCOPY KNEE W/ DRILLING Left 05/01/10  ? BLADDER TUMOR EXCISION  2009  ? CARDIAC CATHETERIZATION    ? hx 1980's can't remember M D   ? CATARACT EXTRACTION, BILATERAL    ? COLONOSCOPY  2005, 2012  ? FRACTURE SURGERY Right   ? ankle  ? I & D KNEE WITH POLY EXCHANGE Left 03/14/2013  ? Procedure: total knee revision;  Surgeon: Vickey Huger, MD;  Location: [facility];   Service: Orthopedics;  Laterality: Left;  ? JOINT REPLACEMENT Left 02/06/11  ? KNEE ARTHROPLASTY Right 04/18/2020  ? Procedure: COMPUTER ASSISTED TOTAL KNEE ARTHROPLASTY;  Surgeon: Rod Can, MD;  Location[facility];  Service: Orthopedics;  Laterality: Right;  ? PROSTATECTOMY  2001  ? urethal sling  2009  ? ? ?Family History  ?Problem Relation Age of Onset  ? Heart disease Father   ? Heart attack Father   ? COPD Sister   ? ? ?SOCIAL HISTORY: ?Social History  ? ?Socioeconomic History  ? Marital status: Married  ?  Spouse name: Not on file  ? Number of children: 4  ? Years of education: Not on file  ? Highest education level: Not on file  ?Occupational History  ? Not on file  ?Tobacco Use  ? Smoking status: Former  ?  Packs/day: 1.00  ?  Years: 40.00  ?  Pack years: 40.00  ?  Types: Cigarettes  ?  Quit date: 12/24/2000  ?  Years since quitting: 21.2  ?  Passive exposure: Never  ? Smokeless tobacco: Never  ?Vaping Use  ? Vaping Use: Never used  ?Substance and Sexual Activity  ? Alcohol use: Yes  ?  Alcohol/week: 4.0 standard drinks  ?  Types: 4 Cans of beer per week  ? Drug use: No  ? Sexual activity: Not on file  ?  Other Topics Concern  ? Not on file  ?Social History Narrative  ? ** Merged History Encounter **  ?    ? ?Social Determinants of Health  ? ?Financial Resource Strain: Not on file  ?Food Insecurity: Not on file  ?Transportation Needs: Not on file  ?Physical Activity: Not on file  ?Stress: Not on file  ?Social Connections: Not on file  ?Intimate Partner Violence: Not on file  ? ? ?No Known Allergies ? ?Current Outpatient Medications  ?Medication Sig Dispense Refill  ? alendronate (FOSAMAX) 70 MG tablet Take 70 mg by mouth once a week.    ? amLODipine (NORVASC) 10 MG tablet TAKE 1 TABLET EVERY DAY 90 tablet 3  ? aspirin EC 81 MG tablet Take 81 mg by mouth daily. Swallow whole.    ? aspirin-sod bicarb-citric acid (ALKA-SELTZER) 325 MG TBEF tablet Take 650 mg by mouth every 6 (six) hours as needed (indigestion).     ? atorvastatin (LIPITOR) 20 MG tablet TAKE 1 TABLET EVERY DAY 90 tablet 3  ? leuprolide (LUPRON) 7.5 MG injection Inject 7.5 mg into the muscle every 3 (three) months.    ? metoprolol tartrate (LOPRESSOR) 50 MG tablet Take 1 tablet (50 mg total) by mouth 2 (two) times daily.    ? olmesartan (BENICAR) 40 MG tablet TAKE 1 TABLET EVERY DAY (DISCONTINUE AVAPRO) 90 tablet 3  ? omeprazole (PRILOSEC) 40 MG capsule Take 40 mg by mouth daily with breakfast.    ? ?No current facility-administered medications for this visit.  ? ? ?REVIEW OF SYSTEMS:  ?'[X]'$  denotes positive finding, '[ ]'$  denotes negative finding ?Cardiac  Comments:  ?Chest pain or chest pressure:    ?Shortness of breath upon exertion:    ?Short of breath when lying flat:    ?Irregular heart rhythm:    ?    ?Vascular    ?Pain in calf, thigh, or hip brought on by ambulation:    ?Pain in feet at night that wakes you up from your sleep:     ?Blood clot in your veins:    ?Leg swelling:     ?    ?Pulmonary    ?Oxygen at home:    ?Productive cough:     ?Wheezing:     ?    ?Neurologic    ?Sudden weakness in arms or legs:     ?Sudden numbness in arms or legs:     ?Sudden onset of difficulty speaking or slurred speech:    ?Temporary loss of vision in one eye:     ?Problems with dizziness:     ?    ?Gastrointestinal    ?Blood in stool:     ?Vomited blood:     ?    ?Genitourinary    ?Burning when urinating:     ?Blood in urine:    ?    ?Psychiatric    ?Major depression:     ?    ?Hematologic    ?Bleeding problems:    ?Problems with blood clotting too easily:    ?    ?Skin    ?Rashes or ulcers:    ?    ?Constitutional    ?Fever or chills:    ? ? ?PHYSICAL EXAM: ?Vitals:  ? 03/25/22 0820  ?BP: 139/67  ?Pulse: (!) 51  ?Resp: 20  ?Temp: 97.7 ?F (36.5 ?C)  ?TempSrc: Temporal  ?SpO2: 97%  ?Weight: 212 lb 8 oz (96.4 kg)  ?Height: '5\' 9"'$  (1.753 m)  ? ? ?GENERAL:  The patient is a well-nourished male, in no acute distress. The vital signs are documented above. ?CARDIAC: There is a  regular rate and rhythm.  ?VASCULAR:  ?Palpable femoral pulses bilaterally ?Palpable DP pulses bilaterally ?PULMONARY: No respiratory distress. ?ABDOMEN: Soft and non-tender. No pain with deep palpation ?MUSCULOSKELETAL: There are no major deformities or cyanosis. ?NEUROLOGIC: No focal weakness or paresthesias are detected. ?SKIN: There are no ulcers or rashes noted. ?PSYCHIATRIC: The patient has a normal affect. ? ?DATA:  ? ?CTA abdomen pelvis reviewed from 03/10/2022 and abdominal aortic aneurysm largest diameter on sagittal imaging is 4.4 cm.  Right common iliac aneurysm measures 2.7 cm.  Left common iliac is ectatic. ? ?AAA duplex 02/09/2022 concerning for increase in abdominal aortic's aneurysm size from 4.0 to 4.9 cm with bilateral common iliac aneurysms the right being the largest at 3.3 cm. ? ?Assessment/Plan: ? ?81 year old male referred for evaluation of enlarging abdominal aortic aneurysm by Dr. Einar Gip .  His most recent duplex with Dr. Einar Gip on 02/04/2022 suggested increased from 4 to 4.9 cm in the distal abdominal aorta and this was compared to a duplex from 06/18/2021.  There was also concern for a 3.3 cm right common iliac artery aneurysm. ? ?I discussed that his CTA shows his aneurysms are not as large as the duplex would suggest.  I reviewed his imaging and the largest diameter of his abdominal aortic aneurysm is 4.4 cm.  The largest iliac aneurysm is the right common iliac aneurysm that measures 2.7 cm.  No evidence of significant rapid growth.  Discussed normal guidelines are to repair abdominal aortic aneurysms at greater than 5.5 cm in men and common iliac artery aneurysms greater than 3 to 3.5 cm in men.  I recommended continued surveillance for now.  I will see him in 6 months with AAA duplex here in the office and a visit with me.  Discussed he call with any new concerns or symptoms. ? ?Marty Heck, MD ?[facility] ?Office: [phone] ? ? ? ? ?

## 2022-03-31 ENCOUNTER — Other Ambulatory Visit: Payer: Self-pay | Admitting: *Deleted

## 2022-03-31 DIAGNOSIS — I714 Abdominal aortic aneurysm, without rupture, unspecified: Secondary | ICD-10-CM

## 2022-04-08 ENCOUNTER — Ambulatory Visit: Payer: Medicare HMO | Admitting: Vascular Surgery

## 2022-04-08 DIAGNOSIS — Z192 Hormone resistant malignancy status: Secondary | ICD-10-CM | POA: Diagnosis not present

## 2022-04-08 DIAGNOSIS — Z79818 Long term (current) use of other agents affecting estrogen receptors and estrogen levels: Secondary | ICD-10-CM | POA: Diagnosis not present

## 2022-04-08 DIAGNOSIS — Z8546 Personal history of malignant neoplasm of prostate: Secondary | ICD-10-CM | POA: Diagnosis not present

## 2022-04-08 DIAGNOSIS — Z9079 Acquired absence of other genital organ(s): Secondary | ICD-10-CM | POA: Diagnosis not present

## 2022-04-08 DIAGNOSIS — C61 Malignant neoplasm of prostate: Secondary | ICD-10-CM | POA: Diagnosis not present

## 2022-04-08 DIAGNOSIS — M8589 Other specified disorders of bone density and structure, multiple sites: Secondary | ICD-10-CM | POA: Diagnosis not present

## 2022-04-08 DIAGNOSIS — R32 Unspecified urinary incontinence: Secondary | ICD-10-CM | POA: Diagnosis not present

## 2022-04-08 DIAGNOSIS — M8580 Other specified disorders of bone density and structure, unspecified site: Secondary | ICD-10-CM | POA: Diagnosis not present

## 2022-04-08 DIAGNOSIS — Z923 Personal history of irradiation: Secondary | ICD-10-CM | POA: Diagnosis not present

## 2022-04-08 DIAGNOSIS — D709 Neutropenia, unspecified: Secondary | ICD-10-CM | POA: Diagnosis not present

## 2022-04-29 DIAGNOSIS — I700 Atherosclerosis of aorta: Secondary | ICD-10-CM | POA: Diagnosis not present

## 2022-04-29 DIAGNOSIS — I2510 Atherosclerotic heart disease of native coronary artery without angina pectoris: Secondary | ICD-10-CM | POA: Diagnosis not present

## 2022-04-29 DIAGNOSIS — I712 Thoracic aortic aneurysm, without rupture, unspecified: Secondary | ICD-10-CM | POA: Diagnosis not present

## 2022-04-29 DIAGNOSIS — C61 Malignant neoplasm of prostate: Secondary | ICD-10-CM | POA: Diagnosis not present

## 2022-05-14 ENCOUNTER — Other Ambulatory Visit: Payer: Self-pay | Admitting: Cardiology

## 2022-05-14 DIAGNOSIS — I10 Essential (primary) hypertension: Secondary | ICD-10-CM

## 2022-05-30 DIAGNOSIS — C61 Malignant neoplasm of prostate: Secondary | ICD-10-CM | POA: Diagnosis not present

## 2022-06-16 DIAGNOSIS — C61 Malignant neoplasm of prostate: Secondary | ICD-10-CM | POA: Diagnosis not present

## 2022-06-16 DIAGNOSIS — I700 Atherosclerosis of aorta: Secondary | ICD-10-CM | POA: Diagnosis not present

## 2022-06-16 DIAGNOSIS — M1990 Unspecified osteoarthritis, unspecified site: Secondary | ICD-10-CM | POA: Diagnosis not present

## 2022-06-16 DIAGNOSIS — I6529 Occlusion and stenosis of unspecified carotid artery: Secondary | ICD-10-CM | POA: Diagnosis not present

## 2022-06-16 DIAGNOSIS — I10 Essential (primary) hypertension: Secondary | ICD-10-CM | POA: Diagnosis not present

## 2022-06-16 DIAGNOSIS — M24542 Contracture, left hand: Secondary | ICD-10-CM | POA: Diagnosis not present

## 2022-06-16 DIAGNOSIS — D692 Other nonthrombocytopenic purpura: Secondary | ICD-10-CM | POA: Diagnosis not present

## 2022-06-16 DIAGNOSIS — R7301 Impaired fasting glucose: Secondary | ICD-10-CM | POA: Diagnosis not present

## 2022-06-16 DIAGNOSIS — E7800 Pure hypercholesterolemia, unspecified: Secondary | ICD-10-CM | POA: Diagnosis not present

## 2022-06-25 ENCOUNTER — Other Ambulatory Visit: Payer: Self-pay | Admitting: Cardiology

## 2022-07-10 DIAGNOSIS — Z79818 Long term (current) use of other agents affecting estrogen receptors and estrogen levels: Secondary | ICD-10-CM | POA: Diagnosis not present

## 2022-07-10 DIAGNOSIS — M8580 Other specified disorders of bone density and structure, unspecified site: Secondary | ICD-10-CM | POA: Diagnosis not present

## 2022-07-10 DIAGNOSIS — M8589 Other specified disorders of bone density and structure, multiple sites: Secondary | ICD-10-CM | POA: Diagnosis not present

## 2022-07-10 DIAGNOSIS — C61 Malignant neoplasm of prostate: Secondary | ICD-10-CM | POA: Diagnosis not present

## 2022-07-10 DIAGNOSIS — Z9079 Acquired absence of other genital organ(s): Secondary | ICD-10-CM | POA: Diagnosis not present

## 2022-07-10 DIAGNOSIS — R32 Unspecified urinary incontinence: Secondary | ICD-10-CM | POA: Diagnosis not present

## 2022-07-10 DIAGNOSIS — Z7189 Other specified counseling: Secondary | ICD-10-CM | POA: Diagnosis not present

## 2022-07-10 DIAGNOSIS — Z192 Hormone resistant malignancy status: Secondary | ICD-10-CM | POA: Diagnosis not present

## 2022-07-10 DIAGNOSIS — Z7983 Long term (current) use of bisphosphonates: Secondary | ICD-10-CM | POA: Diagnosis not present

## 2022-07-10 DIAGNOSIS — Z923 Personal history of irradiation: Secondary | ICD-10-CM | POA: Diagnosis not present

## 2022-07-21 DIAGNOSIS — H401132 Primary open-angle glaucoma, bilateral, moderate stage: Secondary | ICD-10-CM | POA: Diagnosis not present

## 2022-07-23 DIAGNOSIS — Z0100 Encounter for examination of eyes and vision without abnormal findings: Secondary | ICD-10-CM | POA: Diagnosis not present

## 2022-07-30 IMAGING — CT CT CTA ABD/PEL W/CM AND/OR W/O CM
2 of 7 series · 14 of 46 positions shown, 16 images · IV contrast (OMNIPAQUE)
Comparison: 12/05/2016, 04/05/2015

CLINICAL DATA: 80-year-old male with abdominal aortic aneurysm

EXAM:
CTA ABDOMEN AND PELVIS WITHOUT AND WITH CONTRAST
TECHNIQUE: Multidetector CT imaging of the abdomen and pelvis was performed
using the standard protocol during bolus administration of
intravenous contrast. Multiplanar reconstructed images and MIPs were
obtained and reviewed to evaluate the vascular anatomy.

[Series 5: axial arterial · axial · arterial · 0.79mm/px · z∈[-407,-8]mm · 11 of 151 slices shown, 13 images]
[im 9/151  soft-tissue]
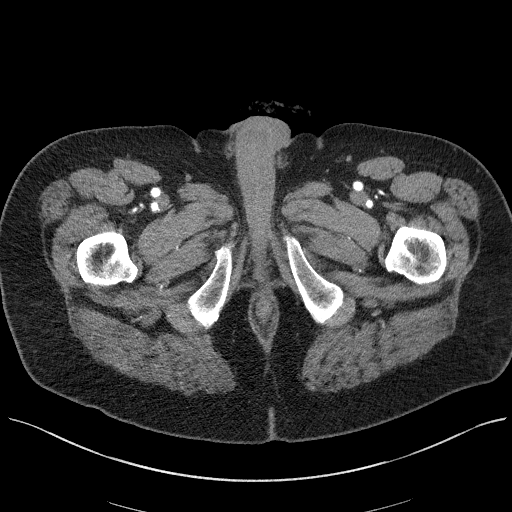
[im 9/151  bone]
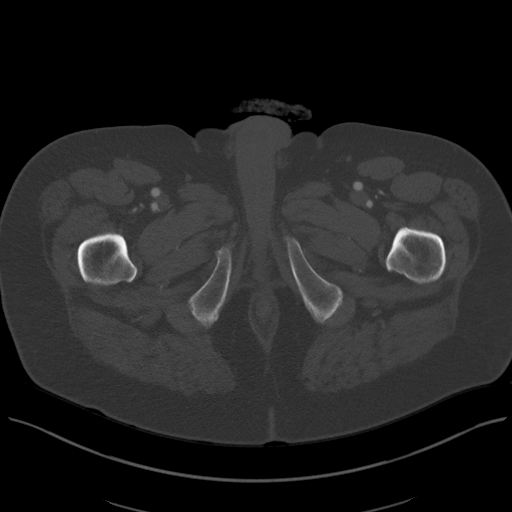
[im 26/151  soft-tissue]
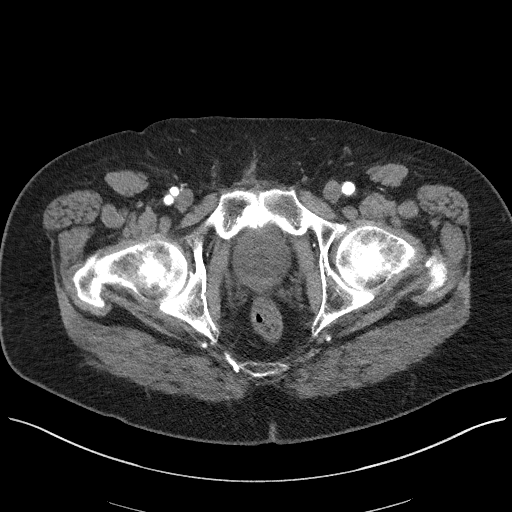
[im 34/151  soft-tissue]
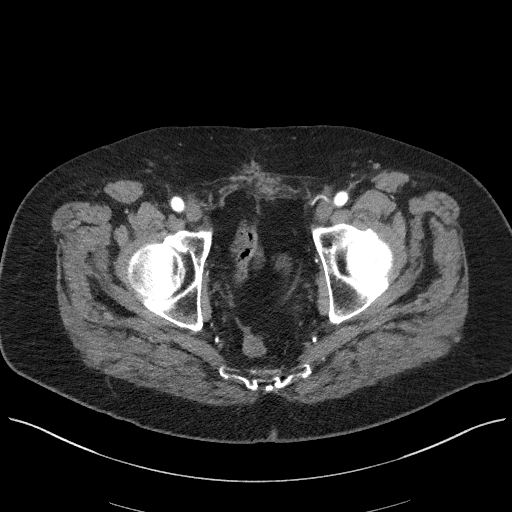
[im 51/151  soft-tissue]
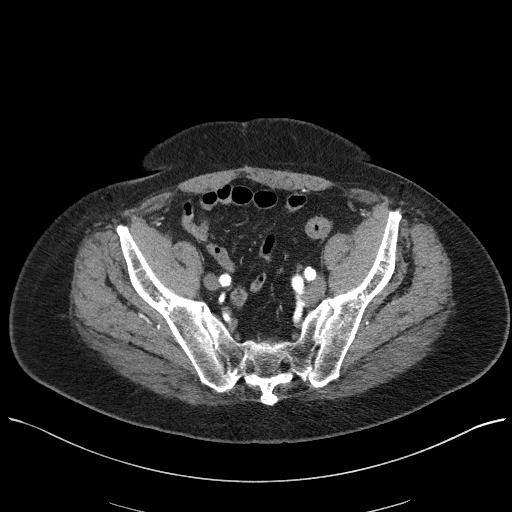
[im 59/151  soft-tissue]
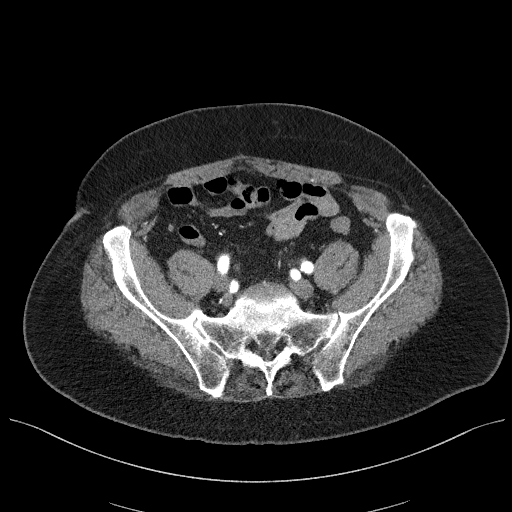
[im 76/151  soft-tissue]
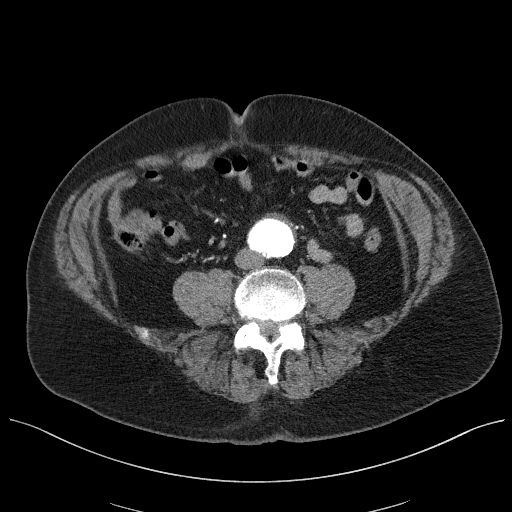
[im 92/151  soft-tissue]
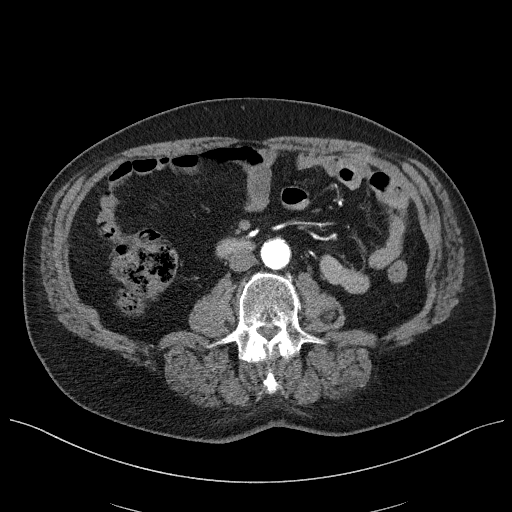
[im 101/151  soft-tissue]
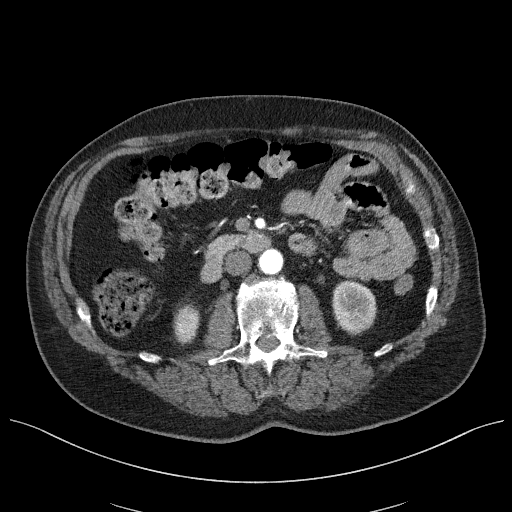
[im 117/151  soft-tissue]
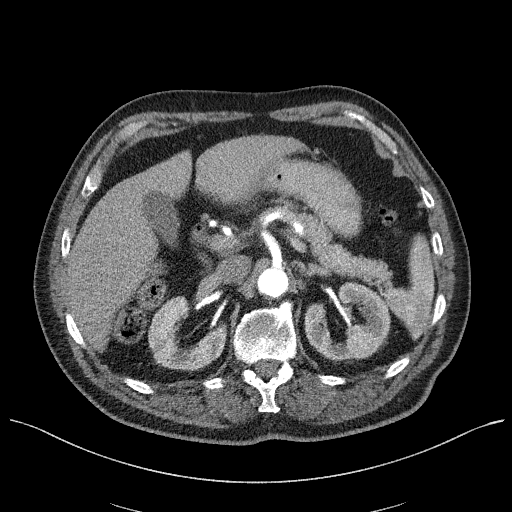
[im 117/151  bone]
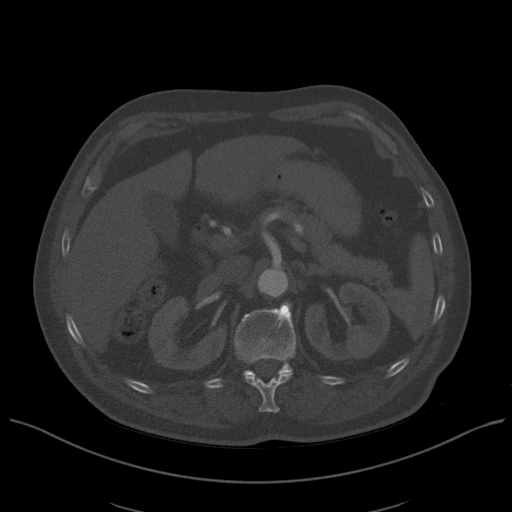
[im 126/151  soft-tissue]
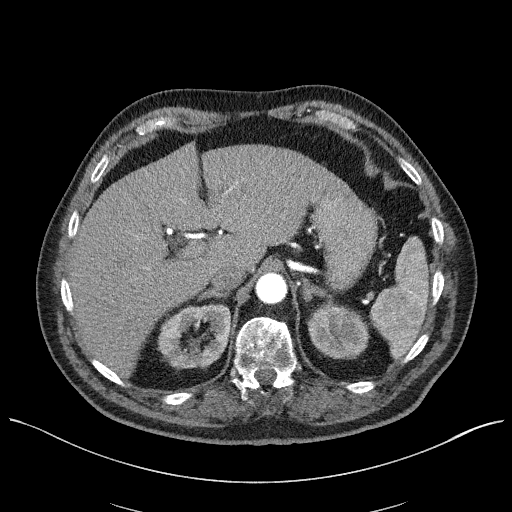
[im 142/151  soft-tissue]
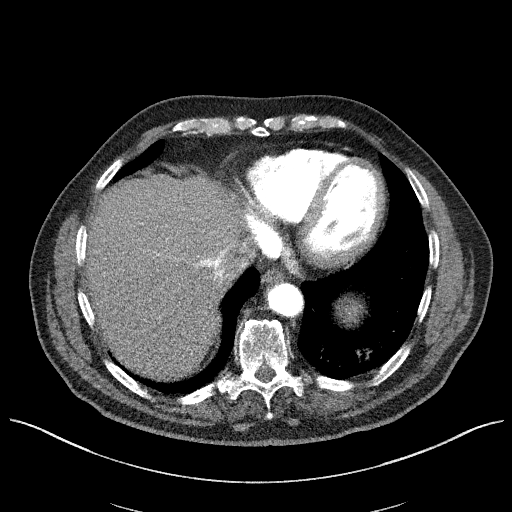

[Series 8: coronals · coronal · 0.76mm/px · 3 of 147 slices shown]
[im 37/147  soft-tissue]
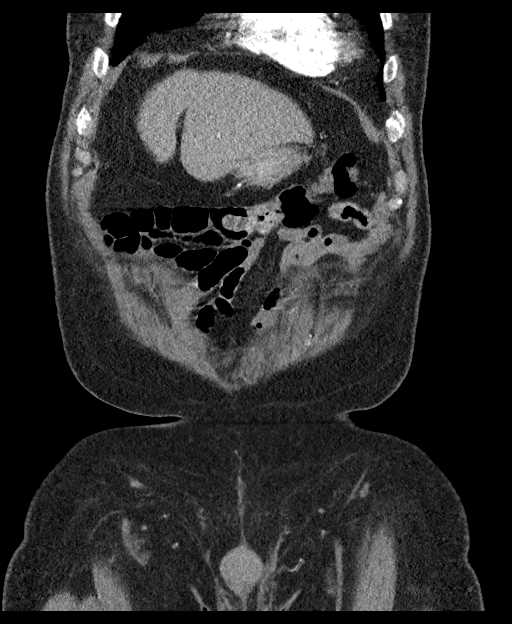
[im 74/147  soft-tissue]
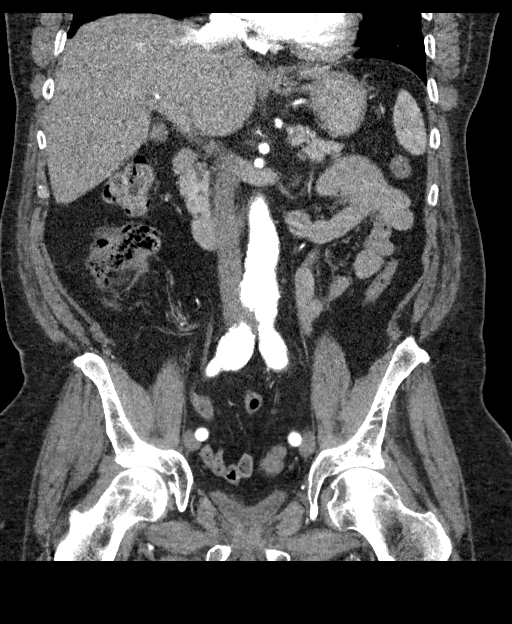
[im 110/147  soft-tissue]
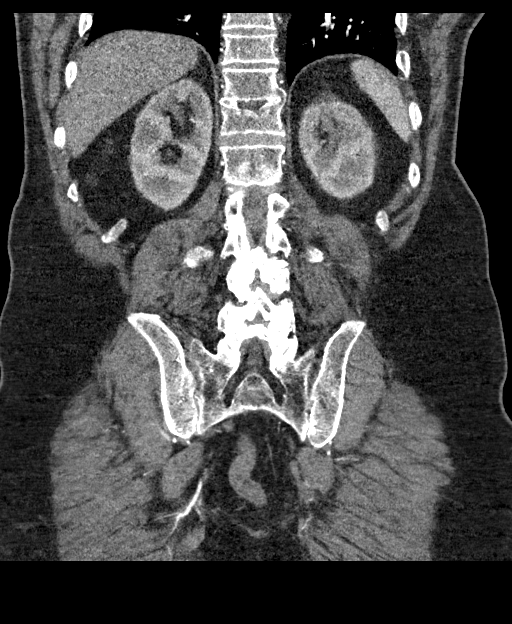

[14 of 46 positions shown; findings below may reference images not displayed]

RADIATION DOSE REDUCTION: This exam was performed according to the
departmental dose-optimization program which includes automated
exposure control, adjustment of the mA and/or kV according to
patient size and/or use of iterative reconstruction technique.

CONTRAST:  100mL OMNIPAQUE IOHEXOL 350 MG/ML SOLN
FINDINGS: VASCULAR

Aorta: Diameter of the aorta measures 24 mm at the hiatus.

Diameter of the aorta estimated 22 mm in the juxtarenal segment.

Previous diameter of the infrarenal abdominal aorta 3.7 cm dated
12/05/2016. Current diameter estimated 4.4 cm, just below IMA
origin. No periaortic fluid or inflammatory changes. No pedunculated
plaque, dissection, or angulation.

Celiac: Patent, with no significant atherosclerotic changes.

SMA: Patent, with no significant atherosclerotic changes.

Renals:

- Right: Single right renal artery without significant
atherosclerosis.

- Left: Main left renal artery without significant atherosclerosis.
Accessory left renal artery to the lower pole anterior segment
arises below the main left renal artery.

IMA: IMA is patent.

Right lower extremity:

Right common iliac artery measures 2.6 cm in diameter. Mild
atherosclerosis. Hypogastric artery remains patent with patent
pelvic arteries. External iliac artery patent with mild
atherosclerosis and no high-grade stenosis or occlusion.

Minimal atherosclerosis of the common femoral artery. High
bifurcation.

Proximal profunda femoris and SFA patent.

Left lower extremity:

Left common iliac artery measures 17 mm. Mild atherosclerosis.
Hypogastric artery is patent with patent pelvic arteries. External
iliac artery patent with mild atherosclerotic changes. No high-grade
stenosis or occlusion.

Minimal atherosclerosis of the left common femoral artery. Proximal
profunda femoris and SFA patent.

Veins: Unremarkable appearance of the venous system.

Review of the MIP images confirms the above findings.

NON-VASCULAR

Lower chest: No acute.

Hepatobiliary: Unremarkable appearance of the liver. Unremarkable
gall bladder.

Pancreas: Unremarkable.

Spleen: Unremarkable.

Adrenals/Urinary Tract:

- Right adrenal gland: Unremarkable

- Left adrenal gland: Unremarkable.

- Right kidney: No hydronephrosis, nephrolithiasis, inflammation, or
ureteral dilation. No focal lesion.

- Left Kidney: No hydronephrosis, nephrolithiasis, inflammation, or
ureteral dilation. No focal lesion.

- Urinary Bladder: Unremarkable.

Stomach/Bowel:

- Stomach: Unremarkable.

- Small bowel: Unremarkable

- Appendix: Normal.

- Colon: Unremarkable.

Lymphatic: No adenopathy.

Mesenteric: No free fluid or air. No mesenteric adenopathy.

Reproductive: Prostatectomy

Other: Fat containing umbilical hernia.

Musculoskeletal: Degenerative changes of the visualized spine. No
bony canal narrowing. Configuration of T12 vertebral body unchanged
from the prior plain film of 11/03/2008.
IMPRESSION: Infrarenal AAA, with the greatest estimated diameter approximally
4.4 cm, previously 3.7 cm in 1888. Aortic aneurysm NOS
([id number].N). Aortic Atherosclerosis (G05T3-FU0.0).

Right CIA aneurysm, 2.6 cm, and left CIA ectasia.

High bifurcation of the right CFA.

Additional ancillary findings as above.

## 2022-08-18 ENCOUNTER — Ambulatory Visit: Payer: Medicare HMO | Admitting: Cardiology

## 2022-08-18 ENCOUNTER — Encounter: Payer: Self-pay | Admitting: Cardiology

## 2022-08-18 VITALS — BP 128/71 | HR 54 | Temp 98.0°F | Resp 16 | Ht 69.0 in | Wt 201.3 lb

## 2022-08-18 DIAGNOSIS — I10 Essential (primary) hypertension: Secondary | ICD-10-CM | POA: Diagnosis not present

## 2022-08-18 DIAGNOSIS — I714 Abdominal aortic aneurysm, without rupture, unspecified: Secondary | ICD-10-CM

## 2022-08-18 DIAGNOSIS — K219 Gastro-esophageal reflux disease without esophagitis: Secondary | ICD-10-CM | POA: Diagnosis not present

## 2022-08-18 DIAGNOSIS — E7800 Pure hypercholesterolemia, unspecified: Secondary | ICD-10-CM | POA: Diagnosis not present

## 2022-08-18 MED ORDER — METOPROLOL TARTRATE 50 MG PO TABS: 50.0000 mg | ORAL_TABLET | Freq: Two times a day (BID) | ORAL | 3 refills | Status: AC

## 2022-08-18 MED ORDER — OMEPRAZOLE 40 MG PO CPDR: 40.0000 mg | DELAYED_RELEASE_CAPSULE | Freq: Every day | ORAL | 0 refills | Status: DC | PRN

## 2022-08-18 NOTE — Progress Notes (Signed)
Primary Physician/Referring:  Haywood Pao, MD  Patient ID: Kevin Roup., male    DOB: June 19, 1941, 81 y.o.   MRN: [id number]  Chief Complaint  Patient presents with   PAD   Hypertension   Follow-up    6 months   HPI:    Kevin Mckone.  is a 81 y.o. Afro-American male with history of hypertension, hyperlipidemia, 4.4 cm AAA, prostate cancer S/P radical prostatectomy in 2001 and presently on Lupron injections and in remission, history of 60 pack year history of cigarette smoking, quit in 2002 and thoracic aortic atherosclerosis.   The patient denies abdominal pain, chest pain, palpitations, syncope, dizziness, difficulty sleeping due to breathing, excessive fatigue, orthopnea, melena, hematochezia, or hematuria.   Past Medical History:  Diagnosis Date   AAA (abdominal aortic aneurysm) ([place]) 12/15/2019   A moderate sized abdominal aortic aneurysm measuring 4.03 x 4.01 x 4.24 cm is seen in the mid aorta.   Arthritis    Bilateral carotid artery stenosis    Essential hypertension 12/30/2018   GERD (gastroesophageal reflux disease)    H/O tobacco use, presenting hazards to health 12/30/2018   > 50-60 pack year quit 2012   Headache(784.0)    History of glaucoma    Incomplete RBBB 12/29/2019   Noted on EKG   Prostate cancer ([place])    Pure hypercholesterolemia 12/30/2018   Sickle cell trait ([place])    Urinary incontinence     Social History   Tobacco Use   Smoking status: Former    Packs/day: 1.00    Years: 40.00    Total pack years: 40.00    Types: Cigarettes    Quit date: 12/24/2000    Years since quitting: 21.6    Passive exposure: Never   Smokeless tobacco: Never  Substance Use Topics   Alcohol use: Yes    Alcohol/week: 4.0 standard drinks of alcohol    Types: 4 Cans of beer per week    Comment: occasional    ROS  Review of Systems  Cardiovascular:  Negative for chest pain, dyspnea on exertion and leg swelling.   Objective  Blood pressure 128/71,  pulse (!) 54, temperature 98 F (36.7 C), temperature source Temporal, resp. rate 16, height _0  (1.753 m), weight 201 lb 4.8 oz (91.3 kg), SpO2 99 %.     08/18/2022    8:54 AM 03/25/2022    8:20 AM 02/25/2022   10:24 AM  Vitals with BMI  Height _1  _2  _3   Weight 201 lbs 5 oz 212 lbs 8 oz 210 lbs  BMI 14.48 18.56 31  Systolic 314 970 263  Diastolic 71 67 72  Pulse 54 51 48     Physical Exam Neck:     Vascular: No carotid bruit or JVD.  Cardiovascular:     Rate and Rhythm: Normal rate and regular rhythm.     Pulses: Intact distal pulses.     Heart sounds: Normal heart sounds. No murmur heard.    No gallop.  Pulmonary:     Effort: Pulmonary effort is normal.     Breath sounds: Normal breath sounds.  Abdominal:     General: Bowel sounds are normal.     Palpations: Abdomen is soft. There is pulsatile mass (measures 4 -5 cm. Mild tendernes).  Musculoskeletal:     Right lower leg: No edema.     Left lower leg: No edema.    Laboratory examination:   Lab Results  Component Value  Date   NA 145 (H) 01/15/2021   K 3.8 01/15/2021   CO2 22 01/15/2021   GLUCOSE 107 (H) 01/15/2021   BUN 13 01/15/2021   CREATININE 1.03 01/15/2021   CALCIUM 9.6 01/15/2021   GFRNONAA 68 01/15/2021    External labs 10/27/2019:   Cholesterol, total 145.000 m 11/28/2021 HDL 43.000 mg 11/28/2021 LDL 73.000 mg 11/28/2021 Triglycerides 143.000 m 11/28/2021  Labs 07/02/2021:  Total cholesterol 142, triglycerides 124, HDL 52, LDL 65.  Non-HDL cholesterol 90.  Serum glucose 105 mg, BUN 12, creatinine 1.0, EGFR 71/86 mL, potassium 3.8, CMP otherwise normal.  BNP elevated at 131.9. Allergies  No Known Allergies   Final Medications at End of Visit     Current Outpatient Medications:    abiraterone acetate (ZYTIGA) 250 MG tablet, Take 1,000 mg by mouth daily., Disp: , Rfl:    alendronate (FOSAMAX) 70 MG tablet, Take 70 mg by mouth once a week., Disp: , Rfl:    amLODipine (NORVASC) 10 MG tablet, TAKE  1 TABLET EVERY DAY, Disp: 90 tablet, Rfl: 3   aspirin EC 81 MG tablet, Take 81 mg by mouth daily. Swallow whole., Disp: , Rfl:    aspirin-sod bicarb-citric acid (ALKA-SELTZER) 325 MG TBEF tablet, Take 650 mg by mouth every 6 (six) hours as needed (indigestion)., Disp: , Rfl:    atorvastatin (LIPITOR) 20 MG tablet, TAKE 1 TABLET EVERY DAY, Disp: 90 tablet, Rfl: 3   leuprolide (LUPRON) 7.5 MG injection, Inject 7.5 mg into the muscle every 3 (three) months., Disp: , Rfl:    olmesartan (BENICAR) 40 MG tablet, TAKE 1 TABLET EVERY DAY (DISCONTINUE AVAPRO), Disp: 90 tablet, Rfl: 3   predniSONE (DELTASONE) 5 MG tablet, Take 5 mg by mouth daily., Disp: , Rfl:    metoprolol tartrate (LOPRESSOR) 50 MG tablet, Take 1 tablet (50 mg total) by mouth 2 (two) times daily., Disp: 180 tablet, Rfl: 3   omeprazole (PRILOSEC) 40 MG capsule, Take 1 capsule (40 mg total) by mouth daily as needed (Acid reflux)., Disp: 90 capsule, Rfl: 0   Radiology:   CT Chest Without Contrast 12/16/2017: Cardiovascular: There is no thoracic aortic aneurysm. There are foci of calcification in the proximal visualized great vessels. Note that the right common and left common carotid arteries arise as a common trunk, an anatomic variant.  2. Aortic atherosclerosis. Foci of great vessel and coronary artery calcification noted.  CTA abdomen and pelvis 03/10/2022: Infrarenal AAA, with the greatest estimated diameter approximally 4.4 cm, previously 3.7 cm in 2018.  Right CIA aneurysm, 2.6 cm, and left CIA ectasia.  Cardiac Studies:   Echocardiogram  [01/10/2016]:  Left ventricle cavity is normal in size. Mild concentric hypertrophy of the left ventricle. Normal global wall motion. Normal diastolic filling pattern. Calculated EF 55%. Left atrial cavity is mildly dilated. Trace aortic regurgitation. Mild tricuspid regurgitation. No evidence of pulmonary hypertension.  Nuclear stress test  [01/07/2016]:  1. The resting  electrocardiogram demonstrated normal sinus rhythm, normal resting conduction and no resting arrhythmias. The stress electrocardiogram was normal. Ther was 2 mm upsloping ST depression with exercise back to baseline at <1 minute into recovery. The patient performed treadmill exercise using a Bruce protocol, completing 6;48 minutes. The patient completed an estimated workload of 8.27 METS, of the maximum predicted heart rate. The stress test was terminated because of fatigue. 2. Myocardial perfusion imaging is normal. Overall left ventricular systolic function was normal without regional wall motion abnormalities. The left ventricular ejection fraction was normal visually but  calculated at (44%). This is a low risk study.  Abdominal Aortic Duplex 02/04/2022: Moderate dilatation of the abdominal aorta is noted in the mid and distal aorta. An abdominal aortic aneurysm measuring 4.1 x 4.1 x 4.9 cm is seen, with bilateral iliac artery involvement. Right CIA measures 3.3x2.9x3.3 cm and left 1.8x1.9x2.0 cm.  Mild diffuse plaque noted in the distal aorta.   Compared to the study done on 06/18/2021, abdominal aortic aneurysm size has increased from 4.0 to 4.9 cm in the distal abdominal aorta and bilateral iliac aneurysm size has increased from 1.4 cm.  Consider surgical consultation.  EKG   EKG 08/18/2022: Sinus bradycardia at rate of 55 bpm, left atrial enlargement, normal axis.  Incomplete right bundle branch block.  Single PVC.  No change from 12/30/2021.  Previously heart rate was 71 bpm.  Assessment     ICD-10-CM   1. Abdominal aortic aneurysm (AAA) 3.0 cm to 5.0 cm in diameter in male [facility])  I71.40 EKG 12-Lead    metoprolol tartrate (LOPRESSOR) 50 MG tablet    2. Primary hypertension  I10 metoprolol tartrate (LOPRESSOR) 50 MG tablet    3. Pure hypercholesterolemia  E78.00     4. Gastroesophageal reflux disease without esophagitis  K21.9 omeprazole (PRILOSEC) 40 MG capsule     Meds ordered this  encounter  Medications   metoprolol tartrate (LOPRESSOR) 50 MG tablet    Sig: Take 1 tablet (50 mg total) by mouth 2 (two) times daily.    Dispense:  180 tablet    Refill:  3   omeprazole (PRILOSEC) 40 MG capsule    Sig: Take 1 capsule (40 mg total) by mouth daily as needed (Acid reflux).    Dispense:  90 capsule    Refill:  0    Refills to Dr. Domenick Gong   Medications Discontinued During This Encounter  Medication Reason   omeprazole (PRILOSEC) 40 MG capsule Reorder   metoprolol tartrate (LOPRESSOR) 50 MG tablet Reorder     Recommendations:   Kevin Pacheco.  is a 81 y.o. Afro-American male with history of hypertension, hyperlipidemia, 4.4 cm AAA with extension into bilateral iliac arm, prostate cancer S/P radical prostatectomy in 2001 and presently on Lupron injections and in remission, history of 60 pack year history of cigarette smoking, quit in 2002 and thoracic aortic atherosclerosis.   Patient is presently asymptomatic, although has sinus bradycardia on EKG and also on physical exam, he has been tolerating metoprolol 50 mg p.o. twice daily.  Hence continue the same.  He is already on Benicar and presently on amlodipine as well.  He has developed some mild constipation, advised him to use Metamucil.  If the symptoms do not improve, could consider addition of HCTZ to olmesartan and reducing the dose of the amlodipine to keep his blood pressure well controlled, 130/70 mmHg or less as a goal.  In view of moderate-sized AAA.  Lipids are well controlled, he remains asymptomatic.  Otherwise, no change in his physical exam.  I would like to see him back in 6 months for follow-up.   He requested me to refill his prescription for omeprazole, advised him to take it only on a as needed basis.  I reviewed the CT scan of the abdomen, abdominal aortic aneurysm has remained stable.  I could consider taking over his surveillance again from the vascular surgeons.   Adrian Prows, MD,  [facility] 08/18/2022, 9:27 AM Office: [phone] Fax: [phone] Pager: [phone]

## 2022-08-21 DIAGNOSIS — M8580 Other specified disorders of bone density and structure, unspecified site: Secondary | ICD-10-CM | POA: Diagnosis not present

## 2022-08-21 DIAGNOSIS — M8589 Other specified disorders of bone density and structure, multiple sites: Secondary | ICD-10-CM | POA: Diagnosis not present

## 2022-08-21 DIAGNOSIS — Z923 Personal history of irradiation: Secondary | ICD-10-CM | POA: Diagnosis not present

## 2022-08-21 DIAGNOSIS — Z192 Hormone resistant malignancy status: Secondary | ICD-10-CM | POA: Diagnosis not present

## 2022-08-21 DIAGNOSIS — D708 Other neutropenia: Secondary | ICD-10-CM | POA: Diagnosis not present

## 2022-08-21 DIAGNOSIS — Z79818 Long term (current) use of other agents affecting estrogen receptors and estrogen levels: Secondary | ICD-10-CM | POA: Diagnosis not present

## 2022-08-21 DIAGNOSIS — Z7983 Long term (current) use of bisphosphonates: Secondary | ICD-10-CM | POA: Diagnosis not present

## 2022-08-21 DIAGNOSIS — Z9079 Acquired absence of other genital organ(s): Secondary | ICD-10-CM | POA: Diagnosis not present

## 2022-08-21 DIAGNOSIS — C61 Malignant neoplasm of prostate: Secondary | ICD-10-CM | POA: Diagnosis not present

## 2022-08-21 DIAGNOSIS — Z7189 Other specified counseling: Secondary | ICD-10-CM | POA: Diagnosis not present

## 2022-08-21 DIAGNOSIS — R32 Unspecified urinary incontinence: Secondary | ICD-10-CM | POA: Diagnosis not present

## 2022-09-04 ENCOUNTER — Encounter (HOSPITAL_COMMUNITY): Payer: Self-pay

## 2022-09-04 ENCOUNTER — Other Ambulatory Visit: Payer: Self-pay

## 2022-09-04 ENCOUNTER — Emergency Department (HOSPITAL_COMMUNITY)
Admission: EM | Admit: 2022-09-04 | Discharge: 2022-09-04 | Disposition: A | Discharge status: Home / Self Care | Payer: Medicare HMO | Attending: Emergency Medicine | Admitting: Emergency Medicine

## 2022-09-04 ENCOUNTER — Emergency Department (HOSPITAL_COMMUNITY): Payer: Medicare HMO

## 2022-09-04 DIAGNOSIS — R0789 Other chest pain: Secondary | ICD-10-CM | POA: Diagnosis not present

## 2022-09-04 DIAGNOSIS — R509 Fever, unspecified: Secondary | ICD-10-CM | POA: Diagnosis present

## 2022-09-04 DIAGNOSIS — R0602 Shortness of breath: Secondary | ICD-10-CM | POA: Diagnosis not present

## 2022-09-04 DIAGNOSIS — Z8546 Personal history of malignant neoplasm of prostate: Secondary | ICD-10-CM | POA: Insufficient documentation

## 2022-09-04 DIAGNOSIS — N3001 Acute cystitis with hematuria: Secondary | ICD-10-CM | POA: Diagnosis not present

## 2022-09-04 DIAGNOSIS — R079 Chest pain, unspecified: Secondary | ICD-10-CM | POA: Diagnosis not present

## 2022-09-04 DIAGNOSIS — R0609 Other forms of dyspnea: Secondary | ICD-10-CM | POA: Diagnosis not present

## 2022-09-04 DIAGNOSIS — Z20822 Contact with and (suspected) exposure to covid-19: Secondary | ICD-10-CM | POA: Insufficient documentation

## 2022-09-04 DIAGNOSIS — Z7982 Long term (current) use of aspirin: Secondary | ICD-10-CM | POA: Diagnosis not present

## 2022-09-04 LAB — CBC
HCT: 32.3 % — ABNORMAL LOW (ref 39.0–52.0)
Hemoglobin: 11.1 g/dL — ABNORMAL LOW (ref 13.0–17.0)
MCH: 30.7 pg (ref 26.0–34.0)
MCHC: 34.4 g/dL (ref 30.0–36.0)
MCV: 89.2 fL (ref 80.0–100.0)
Platelets: 134 10*3/uL — ABNORMAL LOW (ref 150–400)
RBC: 3.62 MIL/uL — ABNORMAL LOW (ref 4.22–5.81)
RDW: 14.6 % (ref 11.5–15.5)
WBC: 8.7 10*3/uL (ref 4.0–10.5)
nRBC: 0 % (ref 0.0–0.2)

## 2022-09-04 LAB — RESP PANEL BY RT-PCR (FLU A&B, COVID) ARPGX2
Influenza A by PCR: NEGATIVE
Influenza B by PCR: NEGATIVE
SARS Coronavirus 2 by RT PCR: NEGATIVE

## 2022-09-04 LAB — BASIC METABOLIC PANEL
Anion gap: 9 (ref 5–15)
BUN: 11 mg/dL (ref 8–23)
CO2: 22 mmol/L (ref 22–32)
Calcium: 8.9 mg/dL (ref 8.9–10.3)
Chloride: 106 mmol/L (ref 98–111)
Creatinine, Ser: 1.14 mg/dL (ref 0.61–1.24)
GFR, Estimated: >60 mL/min (ref >60)
Glucose, Bld: 154 mg/dL — ABNORMAL HIGH (ref 70–99)
Potassium: 3.1 mmol/L — ABNORMAL LOW (ref 3.5–5.1)
Sodium: 137 mmol/L (ref 135–145)

## 2022-09-04 LAB — URINALYSIS, ROUTINE W REFLEX MICROSCOPIC
Bacteria, UA: NONE SEEN
Bilirubin Urine: NEGATIVE
Glucose, UA: NEGATIVE mg/dL
Ketones, ur: NEGATIVE mg/dL
Nitrite: NEGATIVE
Protein, ur: 30 mg/dL — AB
Specific Gravity, Urine: 1.011 (ref 1.005–1.030)
WBC, UA: >50 WBC/hpf — ABNORMAL HIGH (ref 0–5)
pH: 6 (ref 5.0–8.0)

## 2022-09-04 LAB — TROPONIN I (HIGH SENSITIVITY): Troponin I (High Sensitivity): 11 ng/L (ref <18)

## 2022-09-04 LAB — LACTIC ACID, PLASMA: Lactic Acid, Venous: 1.3 mmol/L (ref 0.5–1.9)

## 2022-09-04 MED ORDER — ACETAMINOPHEN 325 MG PO TABS
650.0000 mg | ORAL_TABLET | Freq: Once | ORAL | Status: AC | PRN
  Administered 2022-09-04: 650 mg via ORAL
  Filled 2022-09-04: qty 2

## 2022-09-04 MED ORDER — CEPHALEXIN 500 MG PO CAPS
500.0000 mg | ORAL_CAPSULE | Freq: Once | ORAL | Status: AC
  Administered 2022-09-04: 500 mg via ORAL
  Filled 2022-09-04: qty 1

## 2022-09-04 MED ORDER — CEPHALEXIN 500 MG PO CAPS: 500.0000 mg | ORAL_CAPSULE | Freq: Four times a day (QID) | ORAL | 0 refills | Status: AC

## 2022-09-04 MED ORDER — POTASSIUM CHLORIDE CRYS ER 20 MEQ PO TBCR
40.0000 meq | EXTENDED_RELEASE_TABLET | Freq: Once | ORAL | Status: AC
  Administered 2022-09-04: 40 meq via ORAL
  Filled 2022-09-04: qty 2

## 2022-09-04 NOTE — ED Triage Notes (Signed)
Patient c/o fever, generalized body aches, chest pain, SOB, and weakness x 2 days.

## 2022-09-04 NOTE — ED Provider Notes (Signed)
[facility] DEPT Provider Note   CSN: [id number] Arrival date & time: 09/04/22  1307     History  Chief Complaint  Patient presents with   Fever   Shortness of Breath   Weakness   Generalized Body Aches   Chest Pain    Kevin Pacheco. is a 81 y.o. male.  Patient is an 81 year old male with a past medical history of prostate cancer, AAA, and hypertension presenting to the emergency department with fever and body aches.  The patient states that he has been having diffuse body aches for the last few days.  He states that he has been having shortness of breath on exertion with a mild nonproductive cough.  He states that he is also incontinent of urine but has been feeling an uncomfortable sensation of his lower abdomen and increased urinary urgency.  He denies dysuria or hematuria.  He denies any nausea or vomiting, diarrhea but does report he has been constipated the last few days.  Denies any known sick contacts.  The history is provided by the patient and a relative.  Fever Associated symptoms: chest pain   Shortness of Breath Associated symptoms: chest pain and fever   Weakness Associated symptoms: chest pain, fever and shortness of breath   Chest Pain Associated symptoms: fever, shortness of breath and weakness        Home Medications Prior to Admission medications   Medication Sig Start Date End Date Taking? Authorizing Provider  cephALEXin (KEFLEX) 500 MG capsule Take 1 capsule (500 mg total) by mouth 4 (four) times daily for 7 days. 09/04/22 09/11/22 Yes Kingsley, Braham, DO  abiraterone acetate (ZYTIGA) 250 MG tablet Take 1,000 mg by mouth daily. 08/04/22   [provider]  alendronate (FOSAMAX) 70 MG tablet Take 70 mg by mouth once a week. 01/17/22   [provider]  amLODipine (NORVASC) 10 MG tablet TAKE 1 TABLET EVERY DAY 05/14/22   Adrian Prows, MD  aspirin EC 81 MG tablet Take 81 mg by mouth daily. Swallow  whole.    [provider]  aspirin-sod bicarb-citric acid (ALKA-SELTZER) 325 MG TBEF tablet Take 650 mg by mouth every 6 (six) hours as needed (indigestion).    [provider]  atorvastatin (LIPITOR) 20 MG tablet TAKE 1 TABLET EVERY DAY 06/25/22   Adrian Prows, MD  leuprolide (LUPRON) 7.5 MG injection Inject 7.5 mg into the muscle every 3 (three) months.    [provider]  metoprolol tartrate (LOPRESSOR) 50 MG tablet Take 1 tablet (50 mg total) by mouth 2 (two) times daily. 08/18/22   Adrian Prows, MD  olmesartan (BENICAR) 40 MG tablet TAKE 1 TABLET EVERY DAY (DISCONTINUE AVAPRO) 12/03/21   Adrian Prows, MD  omeprazole (PRILOSEC) 40 MG capsule Take 1 capsule (40 mg total) by mouth daily as needed (Acid reflux). 08/18/22   Adrian Prows, MD  predniSONE (DELTASONE) 5 MG tablet Take 5 mg by mouth daily. 08/05/22   [provider]      Allergies    Patient has no known allergies.    Review of Systems   Review of Systems  Constitutional:  Positive for fever.  Respiratory:  Positive for shortness of breath.   Cardiovascular:  Positive for chest pain.  Neurological:  Positive for weakness.    Physical Exam Updated Vital Signs BP 136/64   Pulse 71   Temp (!) 101 F (38.3 C) (Oral)   Resp 17   Ht '5\' 9"'$  (1.753 m)  Wt 88.5 kg   SpO2 96%   BMI 28.80 kg/m  Physical Exam Vitals and nursing note reviewed.  Constitutional:      General: He is not in acute distress.    Appearance: He is well-developed.  HENT:     Head: Normocephalic and atraumatic.  Eyes:     Extraocular Movements: Extraocular movements intact.  Cardiovascular:     Rate and Rhythm: Normal rate and regular rhythm.     Pulses: Normal pulses.     Heart sounds: Normal heart sounds.  Pulmonary:     Effort: Pulmonary effort is normal.     Breath sounds: Normal breath sounds.  Abdominal:     Palpations: Abdomen is soft.     Comments: No CVA tenderness bilaterally  Musculoskeletal:        General:  Normal range of motion.     Cervical back: Normal range of motion and neck supple.     Right lower leg: No edema.     Left lower leg: No edema.  Skin:    General: Skin is warm and dry.     Findings: No rash.  Neurological:     General: No focal deficit present.     Mental Status: He is alert and oriented to person, place, and time.  Psychiatric:        Mood and Affect: Mood normal.        Behavior: Behavior normal.     ED Results / Procedures / Treatments   Labs (all labs ordered are listed, but only abnormal results are displayed) Labs Reviewed  BASIC METABOLIC PANEL - Abnormal; Notable for the following components:      Result Value   Potassium 3.1 (*)    Glucose, Bld 154 (*)    All other components within normal limits  CBC - Abnormal; Notable for the following components:   RBC 3.62 (*)    Hemoglobin 11.1 (*)    HCT 32.3 (*)    Platelets 134 (*)    All other components within normal limits  URINALYSIS, ROUTINE W REFLEX MICROSCOPIC - Abnormal; Notable for the following components:   Hgb urine dipstick MODERATE (*)    Protein, ur 30 (*)    Leukocytes,Ua SMALL (*)    WBC, UA >50 (*)    All other components within normal limits  RESP PANEL BY RT-PCR (FLU A&B, COVID) ARPGX2  URINE CULTURE  LACTIC ACID, PLASMA  TROPONIN I (HIGH SENSITIVITY)    EKG EKG Interpretation  Date/Time:  Thursday September 04 2022 13:23:14 EDT Ventricular Rate:  99 PR Interval:  77 QRS Duration: 90 QT Interval:  357 QTC Calculation: 459 R Axis:   6 Text Interpretation: Ectopic atrial rhythm Posterior infarct, old Minimal ST depression, anterolateral leads No significant change since last tracing Confirmed by Leanord Asal ([id number]) on 09/04/2022 2:51:31 PM  Radiology DG Chest Port 1 View  Result Date: 09/04/2022 CLINICAL DATA:  Chest pain EXAM: PORTABLE CHEST 1 VIEW COMPARISON:  12/14/2015 FINDINGS: Cardiac and mediastinal contours are within normal limits. No focal pulmonary opacity.  No pleural effusion or pneumothorax. No acute osseous abnormality. IMPRESSION: No acute cardiopulmonary process. Electronically Signed   By: Merilyn Baba M.D.   On: 09/04/2022 13:46    Procedures Procedures    Medications Ordered in ED Medications  acetaminophen (TYLENOL) tablet 650 mg (650 mg Oral Given 09/04/22 1336)  potassium chloride SA (KLOR-CON M) CR tablet 40 mEq (40 mEq Oral Given 09/04/22 1520)  cephALEXin (KEFLEX) capsule 500  mg (500 mg Oral Given 09/04/22 1619)    ED Course/ Medical Decision Making/ A&P Clinical Course as of 09/04/22 1729  Thu Sep 04, 2022  1601 Urine negative for bacteria but does have hematuria and leukocytes. In the setting of his lower abdominal discomfort with urination and urinary urgency he will be treated for UTI. Urine culture was sent. [VK]    Clinical Course User Index [VK] Kemper Durie, DO                           Medical Decision Making This patient presents to the ED with chief complaint(s) of fever, body aches with pertinent past medical history of prostate cancer, hypertension, AAA which further complicates the presenting complaint. The complaint involves an extensive differential diagnosis and also carries with it a high risk of complications and morbidity.    The differential diagnosis includes pneumonia, viral syndrome, UTI, dehydration, sepsis, ACS, arrhythmia, electrolyte abnormality  Additional history obtained: Additional history obtained from family Records reviewed N/A  ED Course and Reassessment: She was initially evaluated by provider in triage and had labs, chest x-ray and viral panel performed.  His work-up showed no evidence of pneumonia and he was negative for COVID and flu.  He is complaining of urinary symptoms for me and UA will be evaluated.  He was given Tylenol for his fever and his heart rate has improved.  He has no evidence of sepsis on his labs.  Independent labs interpretation:  The following labs were  independently interpreted: UA positive for UTI, labs otherwise within normal range  Independent visualization of imaging: - I independently visualized the following imaging with scope of interpretation limited to determining acute life threatening conditions related to emergency care: Chest x-ray, which revealed acute disease  Consultation: - Consulted or discussed management/test interpretation w/ external professional: N/A  Consideration for admission or further workup: Patient is stable for discharge with primary care follow-up and was given strict return precautions Social Determinants of health: N/A    Amount and/or Complexity of Data Reviewed Labs: ordered.  Risk OTC drugs. Prescription drug management.          Final Clinical Impression(s) / ED Diagnoses Final diagnoses:  Acute cystitis with hematuria  Dyspnea on exertion    Rx / DC Orders ED Discharge Orders          Ordered    cephALEXin (KEFLEX) 500 MG capsule  4 times daily        09/04/22 Conley, [place], DO 09/04/22 1729

## 2022-09-04 NOTE — ED Provider Triage Note (Signed)
Emergency Medicine Provider Triage Evaluation Note  Kevin Pacheco. , a 81 y.o. male  was evaluated in triage.  Pt complains of fever onset Tuesday.  Chest pain off and on for the past week.  No known sick contacts.. Denies changes in bowel or bladder habits.  States he has a mild cough which he relates to mowing the grass. Review of Systems  Positive: As above Negative: As above  Physical Exam  BP 125/61 (BP Location: Left Arm)   Pulse (!) 101   Temp (!) 101 F (38.3 C) (Oral)   Resp 16   Ht '5\' 9"'$  (1.753 m)   Wt 88.5 kg   SpO2 97%   BMI 28.80 kg/m  Gen:   Awake, no distress   Resp:  Normal effort, lungs clear to auscultation MSK:   Moves extremities without difficulty  Other:  Abdomen soft and nontender  Medical Decision Making  Medically screening exam initiated at 1:22 PM.  Appropriate orders placed.  [facility]. was informed that the remainder of the evaluation will be completed by another provider, this initial triage assessment does not replace that evaluation, and the importance of remaining in the ED until their evaluation is complete.     Tacy Learn, PA-C 09/04/22 1323

## 2022-09-04 NOTE — Discharge Instructions (Signed)
You were seen in the emergency department for your fever and shortness of breath.  Your urine does show that you have evidence of a urinary tract infection.  We did send a urine culture so if this grows a bacteria different compared to the antibiotic that we sent you will receive a call and have a new antibiotic sent.  Your work-up otherwise showed that your COVID and flu test were negative and no signs of pneumonia.  You should follow-up with your primary doctor in the next few days to have your symptoms rechecked.  You should return to the emergency department if you are having worsening shortness of breath, fevers despite the antibiotics, repetitive vomiting, worsening abdominal or back pain or any other new or concerning symptoms.

## 2022-09-06 LAB — URINE CULTURE: Culture: >=100000 — AB

## 2022-09-07 ENCOUNTER — Telehealth (HOSPITAL_BASED_OUTPATIENT_CLINIC_OR_DEPARTMENT_OTHER): Payer: Self-pay | Admitting: *Deleted

## 2022-09-07 NOTE — Telephone Encounter (Signed)
Post ED Visit - Positive Culture Follow-up  Culture report reviewed by antimicrobial stewardship pharmacist: [place] Team '[]'$  Elenor Quinones, Pharm.D. '[]'$  Heide Guile, Pharm.D., BCPS AQ-ID '[]'$  Parks Neptune, Pharm.D., BCPS '[]'$  Alycia Rossetti, Pharm.D., BCPS '[]'$  [place], [place].D., BCPS, AAHIVP '[]'$  Legrand Como, Pharm.D., BCPS, AAHIVP '[]'$  Salome Arnt, PharmD, BCPS '[]'$  Johnnette Gourd, PharmD, BCPS '[]'$  Hughes Better, PharmD, BCPS '[]'$  Leeroy Cha, PharmD '[]'$  Laqueta Linden, PharmD, BCPS '[]'$  Albertina Parr, PharmD  [place] Team '[]'$  Leodis Sias, PharmD '[]'$  Lindell Spar, PharmD '[]'$  Royetta Asal, PharmD '[]'$  Graylin Shiver, Rph '[]'$  Rema Fendt) Glennon Mac, PharmD '[]'$  Arlyn Dunning, PharmD '[]'$  Netta Cedars, PharmD '[]'$  Dia Sitter, PharmD '[]'$  Leone Haven, PharmD '[]'$  Gretta Arab, PharmD '[]'$  Theodis Shove, PharmD '[]'$  Peggyann Juba, PharmD '[x]'$  Eilene Ghazi, PharmD   Positive urine culture Treated with Cephalexin, organism sensitive to the same and no further patient follow-up is required at this time.  Kevin Pacheco 09/07/2022, 8:58 AM

## 2022-09-08 DIAGNOSIS — C61 Malignant neoplasm of prostate: Secondary | ICD-10-CM | POA: Diagnosis not present

## 2022-09-11 DIAGNOSIS — R32 Unspecified urinary incontinence: Secondary | ICD-10-CM | POA: Diagnosis not present

## 2022-09-11 DIAGNOSIS — R5382 Chronic fatigue, unspecified: Secondary | ICD-10-CM | POA: Diagnosis not present

## 2022-09-11 DIAGNOSIS — C61 Malignant neoplasm of prostate: Secondary | ICD-10-CM | POA: Diagnosis not present

## 2022-09-11 DIAGNOSIS — M818 Other osteoporosis without current pathological fracture: Secondary | ICD-10-CM | POA: Diagnosis not present

## 2022-09-25 DIAGNOSIS — H6991 Unspecified Eustachian tube disorder, right ear: Secondary | ICD-10-CM | POA: Diagnosis not present

## 2022-09-25 DIAGNOSIS — H6121 Impacted cerumen, right ear: Secondary | ICD-10-CM | POA: Diagnosis not present

## 2022-10-02 DIAGNOSIS — R32 Unspecified urinary incontinence: Secondary | ICD-10-CM | POA: Diagnosis not present

## 2022-10-02 DIAGNOSIS — Z7983 Long term (current) use of bisphosphonates: Secondary | ICD-10-CM | POA: Diagnosis not present

## 2022-10-02 DIAGNOSIS — C61 Malignant neoplasm of prostate: Secondary | ICD-10-CM | POA: Diagnosis not present

## 2022-10-02 DIAGNOSIS — D709 Neutropenia, unspecified: Secondary | ICD-10-CM | POA: Diagnosis not present

## 2022-10-02 DIAGNOSIS — Z79899 Other long term (current) drug therapy: Secondary | ICD-10-CM | POA: Diagnosis not present

## 2022-10-02 DIAGNOSIS — M8580 Other specified disorders of bone density and structure, unspecified site: Secondary | ICD-10-CM | POA: Diagnosis not present

## 2022-10-02 DIAGNOSIS — Z79818 Long term (current) use of other agents affecting estrogen receptors and estrogen levels: Secondary | ICD-10-CM | POA: Diagnosis not present

## 2022-11-03 ENCOUNTER — Other Ambulatory Visit: Payer: Self-pay | Admitting: Cardiology

## 2022-11-03 DIAGNOSIS — K219 Gastro-esophageal reflux disease without esophagitis: Secondary | ICD-10-CM

## 2022-11-12 DIAGNOSIS — Z9079 Acquired absence of other genital organ(s): Secondary | ICD-10-CM | POA: Diagnosis not present

## 2022-11-12 DIAGNOSIS — M8589 Other specified disorders of bone density and structure, multiple sites: Secondary | ICD-10-CM | POA: Diagnosis not present

## 2022-11-12 DIAGNOSIS — D709 Neutropenia, unspecified: Secondary | ICD-10-CM | POA: Diagnosis not present

## 2022-11-12 DIAGNOSIS — M8580 Other specified disorders of bone density and structure, unspecified site: Secondary | ICD-10-CM | POA: Diagnosis not present

## 2022-11-12 DIAGNOSIS — Z192 Hormone resistant malignancy status: Secondary | ICD-10-CM | POA: Diagnosis not present

## 2022-11-12 DIAGNOSIS — C61 Malignant neoplasm of prostate: Secondary | ICD-10-CM | POA: Diagnosis not present

## 2022-11-12 DIAGNOSIS — Z79818 Long term (current) use of other agents affecting estrogen receptors and estrogen levels: Secondary | ICD-10-CM | POA: Diagnosis not present

## 2022-11-12 DIAGNOSIS — Z923 Personal history of irradiation: Secondary | ICD-10-CM | POA: Diagnosis not present

## 2022-11-12 DIAGNOSIS — R32 Unspecified urinary incontinence: Secondary | ICD-10-CM | POA: Diagnosis not present

## 2022-11-12 DIAGNOSIS — E876 Hypokalemia: Secondary | ICD-10-CM | POA: Diagnosis not present

## 2022-12-11 ENCOUNTER — Ambulatory Visit: Payer: Medicare HMO | Admitting: Podiatry

## 2022-12-11 DIAGNOSIS — R7989 Other specified abnormal findings of blood chemistry: Secondary | ICD-10-CM | POA: Diagnosis not present

## 2022-12-11 DIAGNOSIS — C61 Malignant neoplasm of prostate: Secondary | ICD-10-CM | POA: Diagnosis not present

## 2022-12-11 DIAGNOSIS — R7301 Impaired fasting glucose: Secondary | ICD-10-CM | POA: Diagnosis not present

## 2022-12-11 DIAGNOSIS — E7800 Pure hypercholesterolemia, unspecified: Secondary | ICD-10-CM | POA: Diagnosis not present

## 2022-12-11 DIAGNOSIS — I10 Essential (primary) hypertension: Secondary | ICD-10-CM | POA: Diagnosis not present

## 2022-12-12 ENCOUNTER — Ambulatory Visit: Payer: Medicare HMO | Admitting: Podiatry

## 2022-12-12 ENCOUNTER — Encounter: Payer: Self-pay | Admitting: Podiatry

## 2022-12-12 DIAGNOSIS — L600 Ingrowing nail: Secondary | ICD-10-CM

## 2022-12-12 DIAGNOSIS — B351 Tinea unguium: Secondary | ICD-10-CM

## 2022-12-12 NOTE — Patient Instructions (Signed)

## 2022-12-12 NOTE — Progress Notes (Signed)
Subjective:   Patient ID: Kevin Roup., male   DOB: 82 y.o.   MRN: [id number]   HPI Patient presents stating that he has had a lot of problems with his second nails of both feet that they get thick and painful and that he also has generalized nail disease that he has questions about   Review of Systems  All other systems reviewed and are negative.       Objective:  Physical Exam Vitals and nursing note reviewed.  Constitutional:      Appearance: He is well-developed.  Pulmonary:     Effort: Pulmonary effort is normal.  Musculoskeletal:        General: Normal range of motion.  Skin:    General: Skin is warm.  Neurological:     Mental Status: He is alert.     Neurovascular status was found to be intact muscle strength was found to be adequate range of motion was adequate.  Patient is noted to have severe thickness of the second nails of both feet with dystrophic changes noted and moderately painful when pressed with all other nails showing discoloration pattern.  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  At this point he signed consent form understanding risk and I went ahead anesthetized each second digit 60 mg like Marcaine mixture sterile prep done using sterile instrumentation remove the second nail right and left exposed matrix applied phenol for applications 30 seconds followed by alcohol lavage sterile dressing gave instructions on soaks and to wear dressings 24 hours take them off earlier if throbbing were to occur.  Discussed the adjacent nails do not recommend current treatment may be necessary in future

## 2022-12-18 DIAGNOSIS — I700 Atherosclerosis of aorta: Secondary | ICD-10-CM | POA: Diagnosis not present

## 2022-12-18 DIAGNOSIS — I714 Abdominal aortic aneurysm, without rupture, unspecified: Secondary | ICD-10-CM | POA: Diagnosis not present

## 2022-12-18 DIAGNOSIS — E7800 Pure hypercholesterolemia, unspecified: Secondary | ICD-10-CM | POA: Diagnosis not present

## 2022-12-18 DIAGNOSIS — R82998 Other abnormal findings in urine: Secondary | ICD-10-CM | POA: Diagnosis not present

## 2022-12-18 DIAGNOSIS — Z1331 Encounter for screening for depression: Secondary | ICD-10-CM | POA: Diagnosis not present

## 2022-12-18 DIAGNOSIS — I6529 Occlusion and stenosis of unspecified carotid artery: Secondary | ICD-10-CM | POA: Diagnosis not present

## 2022-12-18 DIAGNOSIS — C61 Malignant neoplasm of prostate: Secondary | ICD-10-CM | POA: Diagnosis not present

## 2022-12-18 DIAGNOSIS — I10 Essential (primary) hypertension: Secondary | ICD-10-CM | POA: Diagnosis not present

## 2022-12-18 DIAGNOSIS — Z1339 Encounter for screening examination for other mental health and behavioral disorders: Secondary | ICD-10-CM | POA: Diagnosis not present

## 2022-12-18 DIAGNOSIS — Z0000 Encounter for general adult medical examination without abnormal findings: Secondary | ICD-10-CM | POA: Diagnosis not present

## 2022-12-18 DIAGNOSIS — D692 Other nonthrombocytopenic purpura: Secondary | ICD-10-CM | POA: Diagnosis not present

## 2022-12-18 DIAGNOSIS — M818 Other osteoporosis without current pathological fracture: Secondary | ICD-10-CM | POA: Diagnosis not present

## 2023-01-05 ENCOUNTER — Other Ambulatory Visit: Payer: Self-pay | Admitting: *Deleted

## 2023-01-05 DIAGNOSIS — I714 Abdominal aortic aneurysm, without rupture, unspecified: Secondary | ICD-10-CM

## 2023-01-13 ENCOUNTER — Ambulatory Visit: Payer: Medicare HMO | Admitting: Vascular Surgery

## 2023-01-13 ENCOUNTER — Encounter: Payer: Self-pay | Admitting: Vascular Surgery

## 2023-01-13 ENCOUNTER — Ambulatory Visit (HOSPITAL_COMMUNITY)
Admission: RE | Admit: 2023-01-13 | Discharge: 2023-01-13 | Disposition: A | Discharge status: Home / Self Care | Payer: Medicare HMO | Source: Ambulatory Visit | Attending: Vascular Surgery | Admitting: Vascular Surgery

## 2023-01-13 VITALS — BP 137/78 | HR 46 | Temp 98.0°F | Resp 16 | Ht 68.0 in | Wt 204.0 lb

## 2023-01-13 DIAGNOSIS — I714 Abdominal aortic aneurysm, without rupture, unspecified: Secondary | ICD-10-CM

## 2023-01-13 DIAGNOSIS — I723 Aneurysm of iliac artery: Secondary | ICD-10-CM | POA: Diagnosis not present

## 2023-01-13 NOTE — Progress Notes (Signed)
Patient name: Kevin Pacheco. MRN: [id number] DOB: 09-30-1941 Sex: male  REASON FOR CONSULT: 54 month f/u AAA  HPI: Kevin Pacheco. is a 82 y.o. male, with history of hypertension, hyperlipidemia, prostate cancer, previous tobacco abuse (quit 2002) presents for 81-monthfollow-up of his abdominal aortic aneurysm.  Patient has known about his aneurysm for at least the last 10 years.  This has been followed by Dr. GEinar Gip His AAA duplex on 06/18/2021 to 02/04/2022 showed that his abdominal aortic aneurysm increased from 4.0 to 4.9 cm.  He also has bilateral iliac artery aneurysms and the right was larger measuring 3.3 cm.    Ultimately we sent him for CTA last year and it showed no significant growth with his AAA measuring 4.4 cm.  We recommended continued surveillance.  On follow-up today no new abdominal or back pain.  No new complaints.  Past Medical History:  Diagnosis Date   AAA (abdominal aortic aneurysm) ([facility] 12/15/2019   A moderate sized abdominal aortic aneurysm measuring 4.03 x 4.01 x 4.24 cm is seen in the mid aorta.   Arthritis    Bilateral carotid artery stenosis    Essential hypertension 12/30/2018   GERD (gastroesophageal reflux disease)    H/O tobacco use, presenting hazards to health 12/30/2018   > 50-60 pack year quit 2012   Headache(784.0)    History of glaucoma    Incomplete RBBB 12/29/2019   Noted on EKG   [place]    Pure hypercholesterolemia 12/30/2018   Sickle cell trait ([facility]    Urinary incontinence     Past Surgical History:  Procedure Laterality Date   ARTHROSCOPY KNEE W/ DRILLING Left 05/01/10   BLADDER TUMOR EXCISION  2009   CARDIAC CATHETERIZATION     hx 1980's can't remember M D    CATARACT EXTRACTION, BILATERAL     COLONOSCOPY  2005, 2012   FRACTURE SURGERY Right    ankle   I & D KNEE WITH POLY EXCHANGE Left 03/14/2013   Procedure: total knee revision;  Surgeon: SVickey Huger MD;  [facility]  Service: Orthopedics;   Laterality: Left;   JOINT REPLACEMENT Left 02/06/11   KNEE ARTHROPLASTY Right 04/18/2020   Procedure: COMPUTER ASSISTED TOTAL KNEE ARTHROPLASTY;  Surgeon: SRod Can MD;  Location: [facility];  Service: Orthopedics;  Laterality: Right;   PROSTATECTOMY  2001   urethal sling  2009    Family History  Problem Relation Age of Onset   Heart disease Father    Heart attack Father    COPD Sister     SOCIAL HISTORY: Social History   Socioeconomic History   Marital status: Married    Spouse name: Not on file   Number of children: 4   Years of education: Not on file   Highest education level: Not on file  Occupational History   Not on file  Tobacco Use   Smoking status: Former    Packs/day: 1.00    Years: 40.00    Total pack years: 40.00    Types: Cigarettes    Quit date: 12/24/2000    Years since quitting: 22.0    Passive exposure: Never   Smokeless tobacco: Never  Vaping Use   Vaping Use: Never used  Substance and Sexual Activity   Alcohol use: Yes    Alcohol/week: 4.0 standard drinks of alcohol    Types: 4 Cans of beer per week    Comment: occasional   Drug use: No   Sexual activity:  Not on file  Other Topics Concern   Not on file  Social History Narrative   ** Merged History Encounter **       Social Determinants of Health   Financial Resource Strain: Not on file  Food Insecurity: Not on file  Transportation Needs: Not on file  Physical Activity: Not on file  Stress: Not on file  Social Connections: Not on file  Intimate Partner Violence: Not on file    No Known Allergies  Current Outpatient Medications  Medication Sig Dispense Refill   abiraterone acetate (ZYTIGA) 250 MG tablet Take 1,000 mg by mouth daily.     alendronate (FOSAMAX) 70 MG tablet Take 70 mg by mouth once a week.     amLODipine (NORVASC) 10 MG tablet TAKE 1 TABLET EVERY DAY 90 tablet 3   aspirin EC 81 MG tablet Take 81 mg by mouth daily. Swallow whole.     aspirin-sod bicarb-citric acid  (ALKA-SELTZER) 325 MG TBEF tablet Take 650 mg by mouth every 6 (six) hours as needed (indigestion).     atorvastatin (LIPITOR) 20 MG tablet TAKE 1 TABLET EVERY DAY 90 tablet 3   leuprolide (LUPRON) 7.5 MG injection Inject 7.5 mg into the muscle every 3 (three) months.     metoprolol tartrate (LOPRESSOR) 50 MG tablet Take 1 tablet (50 mg total) by mouth 2 (two) times daily. 180 tablet 3   olmesartan (BENICAR) 40 MG tablet TAKE 1 TABLET EVERY DAY (DISCONTINUE AVAPRO) 90 tablet 3   omeprazole (PRILOSEC) 40 MG capsule TAKE 1 CAPSULE EVERY DAY AS NEEDED FOR ACID REFLUX (REFILLS TO DR. Delfino Lovett TISOVEC) 90 capsule 3   predniSONE (DELTASONE) 5 MG tablet Take 5 mg by mouth daily.     No current facility-administered medications for this visit.    REVIEW OF SYSTEMS:  [X]$  denotes positive finding, [ ]$  denotes negative finding Cardiac  Comments:  Chest pain or chest pressure:    Shortness of breath upon exertion:    Short of breath when lying flat:    Irregular heart rhythm:        Vascular    Pain in calf, thigh, or hip brought on by ambulation:    Pain in feet at night that wakes you up from your sleep:     Blood clot in your veins:    Leg swelling:         Pulmonary    Oxygen at home:    Productive cough:     Wheezing:         Neurologic    Sudden weakness in arms or legs:     Sudden numbness in arms or legs:     Sudden onset of difficulty speaking or slurred speech:    Temporary loss of vision in one eye:     Problems with dizziness:         Gastrointestinal    Blood in stool:     Vomited blood:         Genitourinary    Burning when urinating:     Blood in urine:        Psychiatric    Major depression:         Hematologic    Bleeding problems:    Problems with blood clotting too easily:        Skin    Rashes or ulcers:        Constitutional    Fever or chills:      PHYSICAL EXAM: Vitals:  01/13/23 0815  BP: 137/78  Pulse: (!) 46  Resp: 16  Temp: 98 F (36.7  C)  TempSrc: Temporal  SpO2: 96%  Weight: 204 lb (92.5 kg)  Height: 5' 8"$  (1.727 m)    GENERAL: The patient is a well-nourished male, in no acute distress. The vital signs are documented above. CARDIAC: There is a regular rate and rhythm.  VASCULAR:  Palpable femoral pulses bilaterally Palpable DP pulses bilaterally PULMONARY: No respiratory distress. ABDOMEN: Soft and non-tender. No pain with deep palpation MUSCULOSKELETAL: There are no major deformities or cyanosis. NEUROLOGIC: No focal weakness or paresthesias are detected. PSYCHIATRIC: The patient has a normal affect.  DATA:   AAA duplex today largest aortic diameter 4.4 cm.  No growth in right common iliac aneurysm 3.1 cm (previously 3.3 cm by duplex and CT confirmed only 2.7 cm)  CTA abdomen pelvis reviewed from 03/10/2022 and abdominal aortic aneurysm largest diameter on sagittal imaging is 4.4 cm.  Right common iliac aneurysm measures 2.7 cm.  Left common iliac is ectatic.   Assessment/Plan:  82 year old male referred for evaluation of enlarging abdominal aortic aneurysm by Dr. Einar Gip .  He presents for 6 month follow-up today.  AAA appears stable at 4.4 cm.  This is the same measurement we got on CTA on 03/10/22.  Also no interval growth on right common iliac aneurysm by duplex and CT last year showed 2.7 cm aneurysm.    Discussed normal guidelines are to repair abdominal aortic aneurysms at greater than 5.5 cm in men and common iliac artery aneurysms greater than 3 to 3.5 cm in men.  I recommended continued surveillance for now.  I will see him in 6 months with AAA duplex here in the office and a visit with me.  Discussed he call with any new concerns or symptoms.  Marty Heck, MD [facility] Office: [phone]

## 2023-01-17 ENCOUNTER — Other Ambulatory Visit: Payer: Self-pay

## 2023-01-17 DIAGNOSIS — I714 Abdominal aortic aneurysm, without rupture, unspecified: Secondary | ICD-10-CM

## 2023-01-20 DIAGNOSIS — E876 Hypokalemia: Secondary | ICD-10-CM | POA: Diagnosis not present

## 2023-01-20 DIAGNOSIS — C61 Malignant neoplasm of prostate: Secondary | ICD-10-CM | POA: Diagnosis not present

## 2023-01-20 DIAGNOSIS — Z79818 Long term (current) use of other agents affecting estrogen receptors and estrogen levels: Secondary | ICD-10-CM | POA: Diagnosis not present

## 2023-01-20 DIAGNOSIS — M8589 Other specified disorders of bone density and structure, multiple sites: Secondary | ICD-10-CM | POA: Diagnosis not present

## 2023-01-20 DIAGNOSIS — M8580 Other specified disorders of bone density and structure, unspecified site: Secondary | ICD-10-CM | POA: Diagnosis not present

## 2023-01-20 DIAGNOSIS — Z7952 Long term (current) use of systemic steroids: Secondary | ICD-10-CM | POA: Diagnosis not present

## 2023-01-20 DIAGNOSIS — R32 Unspecified urinary incontinence: Secondary | ICD-10-CM | POA: Diagnosis not present

## 2023-02-02 DIAGNOSIS — H401132 Primary open-angle glaucoma, bilateral, moderate stage: Secondary | ICD-10-CM | POA: Diagnosis not present

## 2023-02-04 ENCOUNTER — Ambulatory Visit: Payer: Medicare HMO | Admitting: Podiatry

## 2023-02-04 ENCOUNTER — Encounter: Payer: Self-pay | Admitting: Podiatry

## 2023-02-04 DIAGNOSIS — L600 Ingrowing nail: Secondary | ICD-10-CM

## 2023-02-04 NOTE — Progress Notes (Signed)
Subjective:   Patient ID: Kevin Roup., male   DOB: 82 y.o.   MRN: [id number]   HPI Patient presents with crusted big toenails both feet concerned about this and whether it is healing normally long-term history ingrown toenails   ROS      Objective:  Physical Exam  Neurovascular status intact crusted second nailbeds bilateral that have slight erythema no drainage noted     Assessment:  History of ingrown toenail with thick crusted type tissue on the second nailbeds that were removed     Plan:  Reviewed that this is relatively normal do not see anything else to do except for soaks and bandages and it should heal uneventfully.  If symptoms were to become more prevalent patient to be seen back

## 2023-02-06 DIAGNOSIS — K219 Gastro-esophageal reflux disease without esophagitis: Secondary | ICD-10-CM | POA: Diagnosis not present

## 2023-02-06 DIAGNOSIS — M818 Other osteoporosis without current pathological fracture: Secondary | ICD-10-CM | POA: Diagnosis not present

## 2023-02-06 DIAGNOSIS — M1990 Unspecified osteoarthritis, unspecified site: Secondary | ICD-10-CM | POA: Diagnosis not present

## 2023-02-06 DIAGNOSIS — C61 Malignant neoplasm of prostate: Secondary | ICD-10-CM | POA: Diagnosis not present

## 2023-02-06 DIAGNOSIS — I714 Abdominal aortic aneurysm, without rupture, unspecified: Secondary | ICD-10-CM | POA: Diagnosis not present

## 2023-02-06 DIAGNOSIS — R0781 Pleurodynia: Secondary | ICD-10-CM | POA: Diagnosis not present

## 2023-02-06 DIAGNOSIS — R1011 Right upper quadrant pain: Secondary | ICD-10-CM | POA: Diagnosis not present

## 2023-02-06 DIAGNOSIS — M545 Low back pain, unspecified: Secondary | ICD-10-CM | POA: Diagnosis not present

## 2023-02-06 DIAGNOSIS — M546 Pain in thoracic spine: Secondary | ICD-10-CM | POA: Diagnosis not present

## 2023-02-09 ENCOUNTER — Other Ambulatory Visit: Payer: Self-pay | Admitting: Family Medicine

## 2023-02-09 DIAGNOSIS — M546 Pain in thoracic spine: Secondary | ICD-10-CM

## 2023-02-09 DIAGNOSIS — M545 Low back pain, unspecified: Secondary | ICD-10-CM

## 2023-02-12 ENCOUNTER — Ambulatory Visit (HOSPITAL_COMMUNITY)
Admission: RE | Admit: 2023-02-12 | Discharge: 2023-02-12 | Disposition: A | Discharge status: Home / Self Care | Payer: Medicare HMO | Source: Ambulatory Visit | Attending: Family Medicine | Admitting: Family Medicine

## 2023-02-12 ENCOUNTER — Ambulatory Visit: Payer: Medicare HMO | Admitting: Cardiology

## 2023-02-12 ENCOUNTER — Encounter: Payer: Self-pay | Admitting: Cardiology

## 2023-02-12 VITALS — BP 127/61 | HR 51 | Resp 16 | Ht 68.0 in | Wt 202.0 lb

## 2023-02-12 DIAGNOSIS — E7800 Pure hypercholesterolemia, unspecified: Secondary | ICD-10-CM

## 2023-02-12 DIAGNOSIS — M48061 Spinal stenosis, lumbar region without neurogenic claudication: Secondary | ICD-10-CM | POA: Diagnosis not present

## 2023-02-12 DIAGNOSIS — M549 Dorsalgia, unspecified: Secondary | ICD-10-CM | POA: Diagnosis not present

## 2023-02-12 DIAGNOSIS — M545 Low back pain, unspecified: Secondary | ICD-10-CM | POA: Insufficient documentation

## 2023-02-12 DIAGNOSIS — M546 Pain in thoracic spine: Secondary | ICD-10-CM | POA: Insufficient documentation

## 2023-02-12 DIAGNOSIS — I10 Essential (primary) hypertension: Secondary | ICD-10-CM | POA: Diagnosis not present

## 2023-02-12 DIAGNOSIS — I714 Abdominal aortic aneurysm, without rupture, unspecified: Secondary | ICD-10-CM | POA: Diagnosis not present

## 2023-02-12 DIAGNOSIS — M5126 Other intervertebral disc displacement, lumbar region: Secondary | ICD-10-CM | POA: Diagnosis not present

## 2023-02-12 NOTE — Progress Notes (Signed)
Primary Physician/Referring:  Haywood Pao, MD  Patient ID: Kevin Roup., male    DOB: 27-Oct-1941, 82 y.o.   MRN: [id number]  Chief Complaint  Patient presents with   Hypertension   AAA   Follow-up    6 months   HPI:    Kevin Pacheco.  is a 82 y.o. Afro-American male with history of hypertension, hyperlipidemia, 4.4 cm AAA, prostate cancer S/P radical prostatectomy in 2001 and presently on Lupron injections and in remission, history of 60 pack year history of cigarette smoking, quit in 2002 and thoracic aortic atherosclerosis.   The patient denies abdominal pain, chest pain, palpitations, syncope, dizziness, difficulty sleeping due to breathing, excessive fatigue, orthopnea, melena, hematochezia, or hematuria.   Over the past 2 months he has noticed severe back pain and muscle pain.  MRI has been scheduled as he has history of prostate cancer to exclude metastatic disease.  Past Medical History:  Diagnosis Date   AAA (abdominal aortic aneurysm) ([place]) 12/15/2019   A moderate sized abdominal aortic aneurysm measuring 4.03 x 4.01 x 4.24 cm is seen in the mid aorta.   Arthritis    Bilateral carotid artery stenosis    Essential hypertension 12/30/2018   GERD (gastroesophageal reflux disease)    H/O tobacco use, presenting hazards to health 12/30/2018   > 50-60 pack year quit 2012   Headache(784.0)    History of glaucoma    Incomplete RBBB 12/29/2019   Noted on EKG   Prostate cancer ([place])    Pure hypercholesterolemia 12/30/2018   Sickle cell trait ([place])    Urinary incontinence     Social History   Tobacco Use   Smoking status: Former    Packs/day: 1.00    Years: 40.00    Additional pack years: 0.00    Total pack years: 40.00    Types: Cigarettes    Quit date: 12/24/2000    Years since quitting: 22.1    Passive exposure: Never   Smokeless tobacco: Never  Substance Use Topics   Alcohol use: Yes    Alcohol/week: 4.0 standard drinks of alcohol    Types:  4 Cans of beer per week    Comment: occasional    ROS  Review of Systems  Cardiovascular:  Negative for chest pain, dyspnea on exertion and leg swelling.   Objective  Blood pressure 127/61, pulse (!) 51, resp. rate 16, height 5\' 8"  (1.727 m), weight 202 lb (91.6 kg), SpO2 96 %.     02/12/2023    9:08 AM 01/13/2023    8:15 AM 09/04/2022    4:20 PM  Vitals with BMI  Height 5\' 8"  5\' 8"    Weight 202 lbs 204 lbs   BMI [id number]   Systolic [id number]  Diastolic 61 78 64  Pulse 51 46 71     Physical Exam Neck:     Vascular: No carotid bruit or JVD.  Cardiovascular:     Rate and Rhythm: Normal rate and regular rhythm.     Pulses: Intact distal pulses.     Heart sounds: Normal heart sounds. No murmur heard.    No gallop.  Pulmonary:     Effort: Pulmonary effort is normal.     Breath sounds: Normal breath sounds.  Abdominal:     General: Bowel sounds are normal.     Palpations: Abdomen is soft. There is pulsatile mass (measures 4 -5 cm. Mild tendernes).  Musculoskeletal:     Right lower leg:  No edema.     Left lower leg: No edema.    Laboratory examination:   Lab Results  Component Value Date   NA 137 09/04/2022   K 3.1 (L) 09/04/2022   CO2 22 09/04/2022   GLUCOSE 154 (H) 09/04/2022   BUN 11 09/04/2022   CREATININE 1.14 09/04/2022   CALCIUM 8.9 09/04/2022   GFRNONAA >60 09/04/2022    External labs 10/27/2019:   Cholesterol, total 145.000 m 11/28/2021 HDL 43.000 mg 11/28/2021 LDL 73.000 mg 11/28/2021 Triglycerides 143.000 m 11/28/2021  Labs 07/02/2021:  Total cholesterol 142, triglycerides 124, HDL 52, LDL 65.  Non-HDL cholesterol 90.  Serum glucose 105 mg, BUN 12, creatinine 1.0, EGFR 71/86 mL, potassium 3.8, CMP otherwise normal.  BNP elevated at 131.9. Allergies  No Known Allergies   Final Medications at End of Visit     Current Outpatient Medications:    abiraterone acetate (ZYTIGA) 250 MG tablet, Take 1,000 mg by mouth daily., Disp: , Rfl:    alendronate  (FOSAMAX) 70 MG tablet, Take 70 mg by mouth once a week., Disp: , Rfl:    amLODipine (NORVASC) 10 MG tablet, TAKE 1 TABLET EVERY DAY, Disp: 90 tablet, Rfl: 3   aspirin EC 81 MG tablet, Take 81 mg by mouth daily. Swallow whole., Disp: , Rfl:    aspirin-sod bicarb-citric acid (ALKA-SELTZER) 325 MG TBEF tablet, Take 650 mg by mouth every 6 (six) hours as needed (indigestion)., Disp: , Rfl:    atorvastatin (LIPITOR) 20 MG tablet, TAKE 1 TABLET EVERY DAY, Disp: 90 tablet, Rfl: 3   leuprolide (LUPRON) 7.5 MG injection, Inject 7.5 mg into the muscle every 3 (three) months., Disp: , Rfl:    metoprolol tartrate (LOPRESSOR) 50 MG tablet, Take 1 tablet (50 mg total) by mouth 2 (two) times daily., Disp: 180 tablet, Rfl: 3   olmesartan (BENICAR) 40 MG tablet, TAKE 1 TABLET EVERY DAY (DISCONTINUE AVAPRO), Disp: 90 tablet, Rfl: 3   omeprazole (PRILOSEC) 40 MG capsule, TAKE 1 CAPSULE EVERY DAY AS NEEDED FOR ACID REFLUX (REFILLS TO DR. Domenick Gong) (Patient taking differently: 40 mg in the morning and at bedtime.), Disp: 90 capsule, Rfl: 3   predniSONE (DELTASONE) 5 MG tablet, Take 5 mg by mouth daily., Disp: , Rfl:    Radiology:   CTA abdomen and pelvis 03/10/2022: Infrarenal AAA, with the greatest estimated diameter approximally 4.4 cm, previously 3.7 cm in 2018.  Right CIA aneurysm, 2.6 cm, and left CIA ectasia.  Cardiac Studies:   Echocardiogram  [01/10/2016]:  Left ventricle cavity is normal in size. Mild concentric hypertrophy of the left ventricle. Normal global wall motion. Normal diastolic filling pattern. Calculated EF 55%. Left atrial cavity is mildly dilated. Trace aortic regurgitation. Mild tricuspid regurgitation. No evidence of pulmonary hypertension.  Nuclear stress test  [01/07/2016]:  1. The resting electrocardiogram demonstrated normal sinus rhythm, normal resting conduction and no resting arrhythmias. The stress electrocardiogram was normal. Ther was 2 mm upsloping ST depression with  exercise back to baseline at <1 minute into recovery. The patient performed treadmill exercise using a Bruce protocol, completing 6;48 minutes. The patient completed an estimated workload of 8.27 METS, of the maximum predicted heart rate. The stress test was terminated because of fatigue. 2. Myocardial perfusion imaging is normal. Overall left ventricular systolic function was normal without regional wall motion abnormalities. The left ventricular ejection fraction was normal visually but calculated at (44%). This is a low risk study.  Abdominal Aortic Duplex 02/202024:  Abdominal Aorta: There is  evidence of abnormal dilatation of the distal Abdominal aorta.  There is evidence of abnormal dilation of the Right Common Iliac artery. The largest aortic diameter has increased compared to  prior exam. Previous diameter measurement was 4.1 cm obtained on 01/30/2022. Prior right CIA measured 3.3 cm.   EKG   EKG 02/12/2023: Marked sinus bradycardia at rate of 48 bpm, left atrial enlargement, normal axis.  Incomplete right bundle branch block.  No evidence of ischemia.  Normal QT interval.  Compared to 08/18/2022, no significant change, PACs (2) not present.   Assessment     ICD-10-CM   1. Abdominal aortic aneurysm (AAA) >39 mm diameter (HCC)  I71.40 EKG 12-Lead    2. Essential hypertension  I10     3. Pure hypercholesterolemia  E78.00      No orders of the defined types were placed in this encounter.  There are no discontinued medications.    Recommendations:   Kevin Pacheco.  is a 82 y.o. Afro-American male with history of hypertension, hyperlipidemia, 4.4 cm AAA with extension into bilateral iliac arm, prostate cancer S/P radical prostatectomy in 2001 and presently on Lupron injections and in remission, history of 60 pack year history of cigarette smoking, quit in 2002 and thoracic aortic atherosclerosis.   1. Abdominal aortic aneurysm (AAA) >39 mm diameter (HCC) I reviewed the results  of the recently performed aortic duplex by Dr. Carlis Abbott, vascular surgery.  Aneurysm has remained stable.  There is no abdominal tenderness.  2. Essential hypertension Blood pressure is well-controlled.  He is on moderate dose of beta-blocker therapy and although 82 y.o. heart rate is markedly reduced at 45, he remains asymptomatic.  I am using moderate intensity beta-blocker therapy in view of a very large aortic aneurysm and also iliac artery aneurysm for protective factors.  He is also on a ARB, olmesartan 40 mg daily.  3. Pure hypercholesterolemia His lipids previously have been well-controlled.  He has an appointment coming up with his PCP, he will follow-up with PCP regarding management of lipids, goal LDL closer to 70.  Patient recently has had severe back spasms and back pain.  As he has history of prostate cancer MRI of the abdomen and spine has been ordered to exclude secondaries.  This is being done today, I will try to keep an eye on this.  Otherwise I will see him back in 6 months for follow-up.    Kevin Prows, MD, [facility] 02/12/2023, 9:53 AM Office: [phone] Fax: [phone] Pager: [phone]

## 2023-02-13 ENCOUNTER — Ambulatory Visit: Payer: Medicare HMO | Admitting: Cardiology

## 2023-02-18 DIAGNOSIS — M545 Low back pain, unspecified: Secondary | ICD-10-CM | POA: Diagnosis not present

## 2023-02-18 DIAGNOSIS — M546 Pain in thoracic spine: Secondary | ICD-10-CM | POA: Diagnosis not present

## 2023-02-23 DIAGNOSIS — M545 Low back pain, unspecified: Secondary | ICD-10-CM | POA: Diagnosis not present

## 2023-02-23 DIAGNOSIS — M546 Pain in thoracic spine: Secondary | ICD-10-CM | POA: Diagnosis not present

## 2023-02-26 DIAGNOSIS — M545 Low back pain, unspecified: Secondary | ICD-10-CM | POA: Diagnosis not present

## 2023-02-26 DIAGNOSIS — M546 Pain in thoracic spine: Secondary | ICD-10-CM | POA: Diagnosis not present

## 2023-03-03 ENCOUNTER — Other Ambulatory Visit: Payer: Medicare HMO

## 2023-04-23 DIAGNOSIS — D849 Immunodeficiency, unspecified: Secondary | ICD-10-CM | POA: Diagnosis not present

## 2023-04-23 DIAGNOSIS — Z79899 Other long term (current) drug therapy: Secondary | ICD-10-CM | POA: Diagnosis not present

## 2023-04-30 DIAGNOSIS — E669 Obesity, unspecified: Secondary | ICD-10-CM | POA: Diagnosis not present

## 2023-04-30 DIAGNOSIS — C61 Malignant neoplasm of prostate: Secondary | ICD-10-CM | POA: Diagnosis not present

## 2023-04-30 DIAGNOSIS — I700 Atherosclerosis of aorta: Secondary | ICD-10-CM | POA: Diagnosis not present

## 2023-04-30 DIAGNOSIS — I119 Hypertensive heart disease without heart failure: Secondary | ICD-10-CM | POA: Diagnosis not present

## 2023-04-30 DIAGNOSIS — I509 Heart failure, unspecified: Secondary | ICD-10-CM | POA: Diagnosis not present

## 2023-04-30 DIAGNOSIS — I471 Supraventricular tachycardia, unspecified: Secondary | ICD-10-CM | POA: Diagnosis not present

## 2023-04-30 DIAGNOSIS — M549 Dorsalgia, unspecified: Secondary | ICD-10-CM | POA: Diagnosis not present

## 2023-04-30 DIAGNOSIS — Z23 Encounter for immunization: Secondary | ICD-10-CM | POA: Diagnosis not present

## 2023-04-30 DIAGNOSIS — G4700 Insomnia, unspecified: Secondary | ICD-10-CM | POA: Diagnosis not present

## 2023-05-18 DIAGNOSIS — C61 Malignant neoplasm of prostate: Secondary | ICD-10-CM | POA: Diagnosis not present

## 2023-05-18 DIAGNOSIS — D708 Other neutropenia: Secondary | ICD-10-CM | POA: Diagnosis not present

## 2023-05-18 DIAGNOSIS — Z79818 Long term (current) use of other agents affecting estrogen receptors and estrogen levels: Secondary | ICD-10-CM | POA: Diagnosis not present

## 2023-05-18 DIAGNOSIS — M8589 Other specified disorders of bone density and structure, multiple sites: Secondary | ICD-10-CM | POA: Diagnosis not present

## 2023-06-02 DIAGNOSIS — R0789 Other chest pain: Secondary | ICD-10-CM | POA: Diagnosis not present

## 2023-06-02 DIAGNOSIS — I119 Hypertensive heart disease without heart failure: Secondary | ICD-10-CM | POA: Diagnosis not present

## 2023-06-02 DIAGNOSIS — Z7189 Other specified counseling: Secondary | ICD-10-CM | POA: Diagnosis not present

## 2023-06-16 DIAGNOSIS — C61 Malignant neoplasm of prostate: Secondary | ICD-10-CM | POA: Diagnosis not present

## 2023-06-21 ENCOUNTER — Other Ambulatory Visit: Payer: Self-pay | Admitting: Cardiology

## 2023-06-21 DIAGNOSIS — I10 Essential (primary) hypertension: Secondary | ICD-10-CM

## 2023-07-14 ENCOUNTER — Ambulatory Visit: Payer: Medicare HMO | Admitting: Vascular Surgery

## 2023-07-14 ENCOUNTER — Ambulatory Visit (HOSPITAL_COMMUNITY): Payer: Medicare HMO

## 2023-07-29 ENCOUNTER — Other Ambulatory Visit: Payer: Self-pay | Admitting: Cardiology

## 2023-07-29 DIAGNOSIS — K219 Gastro-esophageal reflux disease without esophagitis: Secondary | ICD-10-CM

## 2023-08-04 ENCOUNTER — Ambulatory Visit: Payer: Medicare HMO | Admitting: Cardiology

## 2023-08-04 ENCOUNTER — Encounter: Payer: Self-pay | Admitting: Cardiology

## 2023-08-04 VITALS — BP 130/67 | HR 45 | Resp 16 | Ht 68.0 in | Wt 203.0 lb

## 2023-08-04 DIAGNOSIS — I714 Abdominal aortic aneurysm, without rupture, unspecified: Secondary | ICD-10-CM

## 2023-08-04 DIAGNOSIS — E7800 Pure hypercholesterolemia, unspecified: Secondary | ICD-10-CM

## 2023-08-04 DIAGNOSIS — I10 Essential (primary) hypertension: Secondary | ICD-10-CM

## 2023-08-04 NOTE — Progress Notes (Signed)
Primary Physician/Referring:  Sharmon Revere, MD  Patient ID: Kevin Pacheco., male    DOB: 12/01/1940, 82 y.o.   MRN: [id number]  Chief Complaint  Patient presents with   Abdominal aortic aneurysm (AAA) >39 mm diameter    Essential hypertension   Follow-up    6 months   HPI:    Langley Krom.  is a 82 y.o. Afro-American male with history of hypertension, hyperlipidemia, 4.4 cm AAA, prostate cancer S/P radical prostatectomy in 2001 and presently on Lupron injections and in remission, history of 60 pack year history of cigarette smoking, quit in 2002 and thoracic aortic atherosclerosis.   The patient denies abdominal pain, chest pain, palpitations, syncope, dizziness, difficulty sleeping due to breathing, excessive fatigue, orthopnea, melena, hematochezia, or hematuria.    Past Medical History:  Diagnosis Date   AAA (abdominal aortic aneurysm) (HCC) 12/15/2019   A moderate sized abdominal aortic aneurysm measuring 4.03 x 4.01 x 4.24 cm is seen in the mid aorta.   Arthritis    Bilateral carotid artery stenosis    Essential hypertension 12/30/2018   GERD (gastroesophageal reflux disease)    H/O tobacco use, presenting hazards to health 12/30/2018   > 50-60 pack year quit 2012   Headache(784.0)    History of glaucoma    Incomplete RBBB 12/29/2019   Noted on EKG   Prostate cancer (HCC)    Pure hypercholesterolemia 12/30/2018   Sickle cell trait (HCC)    Urinary incontinence     Social History   Tobacco Use   Smoking status: Former    Current packs/day: 0.00    Average packs/day: 1 pack/day for 40.0 years (40.0 ttl pk-yrs)    Types: Cigarettes    Start date: 12/24/1960    Quit date: 12/24/2000    Years since quitting: 22.6    Passive exposure: Never   Smokeless tobacco: Never  Substance Use Topics   Alcohol use: Yes    Alcohol/week: 4.0 standard drinks of alcohol    Types: 4 Cans of beer per week    Comment: occasional    ROS  Review of Systems   Cardiovascular:  Negative for chest pain, dyspnea on exertion and leg swelling.   Objective  Blood pressure 130/67, pulse (!) 45, resp. rate 16, height 5\' 8"  (1.727 m), weight 203 lb (92.1 kg), SpO2 99%.     08/04/2023    8:54 AM 02/12/2023    9:08 AM 01/13/2023    8:15 AM  Vitals with BMI  Height 5\' 8"  5\' 8"  5\' 8"   Weight 203 lbs 202 lbs 204 lbs  BMI 30.87 30.72 31.03  Systolic 130 127 409  Diastolic 67 61 78  Pulse 45 51 46     Physical Exam Neck:     Vascular: No carotid bruit or JVD.  Cardiovascular:     Rate and Rhythm: Normal rate and regular rhythm.     Pulses: Intact distal pulses.     Heart sounds: Normal heart sounds. No murmur heard.    No gallop.  Pulmonary:     Effort: Pulmonary effort is normal.     Breath sounds: Normal breath sounds.  Abdominal:     General: Bowel sounds are normal.     Palpations: Abdomen is soft. There is pulsatile mass (measures 4 -5 cm. Mild tendernes).  Musculoskeletal:     Right lower leg: No edema.     Left lower leg: No edema.    Laboratory examination:   Lab Results  Component Value Date   NA 137 09/04/2022   K 3.1 (L) 09/04/2022   CO2 22 09/04/2022   GLUCOSE 154 (H) 09/04/2022   BUN 11 09/04/2022   CREATININE 1.14 09/04/2022   CALCIUM 8.9 09/04/2022   GFRNONAA >60 09/04/2022    External labs 10/27/2019:   Cholesterol, total 145.000 m 11/28/2021 HDL 43.000 mg 11/28/2021 LDL 73.000 mg 11/28/2021 Triglycerides 143.000 m 11/28/2021  Labs 07/02/2021:  Total cholesterol 142, triglycerides 124, HDL 52, LDL 65.  Non-HDL cholesterol 90.  Serum glucose 105 mg, BUN 12, creatinine 1.0, EGFR 71/86 mL, potassium 3.8, CMP otherwise normal.  BNP elevated at 131.9. Allergies  No Known Allergies   Final Medications at End of Visit     Current Outpatient Medications:    abiraterone acetate (ZYTIGA) 250 MG tablet, Take 1,000 mg by mouth daily., Disp: , Rfl:    alendronate (FOSAMAX) 70 MG tablet, Take 70 mg by mouth once a week., Disp:  , Rfl:    amLODipine (NORVASC) 10 MG tablet, TAKE 1 TABLET EVERY DAY, Disp: 90 tablet, Rfl: 3   aspirin EC 81 MG tablet, Take 81 mg by mouth daily. Swallow whole., Disp: , Rfl:    aspirin-sod bicarb-citric acid (ALKA-SELTZER) 325 MG TBEF tablet, Take 650 mg by mouth every 6 (six) hours as needed (indigestion)., Disp: , Rfl:    atorvastatin (LIPITOR) 20 MG tablet, TAKE 1 TABLET EVERY DAY, Disp: 90 tablet, Rfl: 3   leuprolide (LUPRON) 7.5 MG injection, Inject 7.5 mg into the muscle every 3 (three) months., Disp: , Rfl:    metoprolol tartrate (LOPRESSOR) 50 MG tablet, Take 1 tablet (50 mg total) by mouth 2 (two) times daily., Disp: 180 tablet, Rfl: 3   olmesartan (BENICAR) 40 MG tablet, TAKE 1 TABLET EVERY DAY (DISCONTINUE AVAPRO), Disp: 90 tablet, Rfl: 3   omeprazole (PRILOSEC) 40 MG capsule, TAKE 1 CAPSULE EVERY DAY AS NEEDED FOR ACID REFLUX (REFILLS TO DR. Guerry Bruin) (Patient taking differently: 40 mg in the morning and at bedtime.), Disp: 90 capsule, Rfl: 3   predniSONE (DELTASONE) 5 MG tablet, Take 5 mg by mouth daily., Disp: , Rfl:    Radiology:   CTA abdomen and pelvis 03/10/2022: Infrarenal AAA, with the greatest estimated diameter approximally 4.4 cm, previously 3.7 cm in 2018.  Right CIA aneurysm, 2.6 cm, and left CIA ectasia.  MRI 02/12/2023:  Redemonstrated infrarenal abdominal aortic aneurysm, which measures up to 4.4 cm, similar to the 2019 CTA   Cardiac Studies:   Echocardiogram  [01/10/2016]:  Left ventricle cavity is normal in size. Mild concentric hypertrophy of the left ventricle. Normal global wall motion. Normal diastolic filling pattern. Calculated EF 55%. Left atrial cavity is mildly dilated. Trace aortic regurgitation. Mild tricuspid regurgitation. No evidence of pulmonary hypertension.  Nuclear stress test  [01/07/2016]:  1. The resting electrocardiogram demonstrated normal sinus rhythm, normal resting conduction and no resting arrhythmias. The stress  electrocardiogram was normal. Ther was 2 mm upsloping ST depression with exercise back to baseline at <1 minute into recovery. The patient performed treadmill exercise using a Bruce protocol, completing 6;48 minutes. The patient completed an estimated workload of 8.27 METS, of the maximum predicted heart rate. The stress test was terminated because of fatigue. 2. Myocardial perfusion imaging is normal. Overall left ventricular systolic function was normal without regional wall motion abnormalities. The left ventricular ejection fraction was normal visually but calculated at (44%). This is a low risk study.  Abdominal Aortic Duplex 02/202024:  Abdominal Aorta: There is  evidence of abnormal dilatation of the distal Abdominal aorta.  There is evidence of abnormal dilation of the Right Common Iliac artery. The largest aortic diameter has increased compared to  prior exam. Previous diameter measurement was 4.1 cm obtained on 01/30/2022. Prior right CIA measured 3.3 cm.   EKG   EKG 08/04/2023: Marked sinus bradycardia rate of 44 bpm otherwise normal EKG.  Compared to 02/12/2023, heart rate previously was 48 bpm.  Assessment     ICD-10-CM   1. Abdominal aortic aneurysm (AAA) >39 mm diameter (HCC)  I71.40 EKG 12-Lead    PCV AORTA DUPLEX    2. Essential hypertension  I10     3. Pure hypercholesterolemia  E78.00      No orders of the defined types were placed in this encounter.  There are no discontinued medications.    Recommendations:   Seward Wingrove.  is a 81 y.o. Afro-American male with history of hypertension, hyperlipidemia, 4.4 cm AAA with extension into bilateral iliac arm, prostate cancer S/P radical prostatectomy in 2001 and presently on Lupron injections and in remission, history of 60 pack year history of cigarette smoking, quit in 2002 and thoracic aortic atherosclerosis.   1. Abdominal aortic aneurysm (AAA) >39 mm diameter (HCC) Patient's abdominal aortic aneurysm has  remained stable around 4.4 cm.  I reviewed his MRI that was done this year as well, over the past 6 years fortunately the aneurysm has remained stable, he is on appropriate medical therapy including beta-blocker therapy and also ARB.  Continue the same, will repeat duplex in 1 year.  - EKG 12-Lead - PCV AORTA DUPLEX; Future  2. Essential hypertension Blood pressure is well-controlled on present medical regimen.  I do not have his recent labs, patient will drop off recent labs performed by his PCP, I will evaluate this and make any changes if necessary.  Patient is presently on a beta-blocker, metoprolol 50 mg twice daily and continues to remain moderately bradycardic but completely asymptomatic.  Hence I did not change his medications.  He is now 82 years of age and continue to remain very active.  3. Pure hypercholesterolemia Lipids previously have been very well-controlled a year ago.  Recent labs will be dropped off for the patient which I will certainly review and if not at goal we will make necessary adjustments to his medications.  He is presently on atorvastatin 40 mg daily, continue the same.  Overall stable from cardiovascular standpoint, I will continue to see him back on annual basis.    Yates Decamp, MD, [facility] 08/04/2023, 9:31 AM Office: [phone] Fax: [phone] Pager: [phone]

## 2023-08-13 ENCOUNTER — Ambulatory Visit: Payer: Medicare HMO | Admitting: Cardiology

## 2023-09-02 ENCOUNTER — Other Ambulatory Visit: Payer: Self-pay | Admitting: Cardiology

## 2023-09-08 ENCOUNTER — Ambulatory Visit
Admission: RE | Admit: 2023-09-08 | Discharge: 2023-09-08 | Disposition: A | Discharge status: Home / Self Care | Payer: Medicare HMO | Source: Ambulatory Visit | Attending: Family Medicine

## 2023-09-08 ENCOUNTER — Other Ambulatory Visit: Payer: Self-pay | Admitting: Family Medicine

## 2023-09-08 DIAGNOSIS — G8929 Other chronic pain: Secondary | ICD-10-CM

## 2023-11-03 ENCOUNTER — Emergency Department (HOSPITAL_BASED_OUTPATIENT_CLINIC_OR_DEPARTMENT_OTHER)
Admission: EM | Admit: 2023-11-03 | Discharge: 2023-11-03 | Disposition: A | Discharge status: Home / Self Care | Payer: Medicare HMO | Attending: Emergency Medicine | Admitting: Emergency Medicine

## 2023-11-03 ENCOUNTER — Other Ambulatory Visit: Payer: Self-pay

## 2023-11-03 ENCOUNTER — Other Ambulatory Visit (HOSPITAL_BASED_OUTPATIENT_CLINIC_OR_DEPARTMENT_OTHER): Payer: Self-pay

## 2023-11-03 ENCOUNTER — Emergency Department (HOSPITAL_BASED_OUTPATIENT_CLINIC_OR_DEPARTMENT_OTHER): Payer: Medicare HMO

## 2023-11-03 DIAGNOSIS — Z8546 Personal history of malignant neoplasm of prostate: Secondary | ICD-10-CM | POA: Diagnosis not present

## 2023-11-03 DIAGNOSIS — R109 Unspecified abdominal pain: Secondary | ICD-10-CM | POA: Insufficient documentation

## 2023-11-03 DIAGNOSIS — Z79899 Other long term (current) drug therapy: Secondary | ICD-10-CM | POA: Diagnosis not present

## 2023-11-03 DIAGNOSIS — Z7982 Long term (current) use of aspirin: Secondary | ICD-10-CM | POA: Insufficient documentation

## 2023-11-03 DIAGNOSIS — M545 Low back pain, unspecified: Secondary | ICD-10-CM | POA: Diagnosis not present

## 2023-11-03 LAB — BASIC METABOLIC PANEL
Anion gap: 9 (ref 5–15)
BUN: 15 mg/dL (ref 8–23)
CO2: 28 mmol/L (ref 22–32)
Calcium: 10.4 mg/dL — ABNORMAL HIGH (ref 8.9–10.3)
Chloride: 105 mmol/L (ref 98–111)
Creatinine, Ser: 1.04 mg/dL (ref 0.61–1.24)
GFR, Estimated: >60 mL/min (ref >60)
Glucose, Bld: 109 mg/dL — ABNORMAL HIGH (ref 70–99)
Potassium: 4.1 mmol/L (ref 3.5–5.1)
Sodium: 142 mmol/L (ref 135–145)

## 2023-11-03 LAB — URINALYSIS, ROUTINE W REFLEX MICROSCOPIC
Bilirubin Urine: NEGATIVE
Glucose, UA: NEGATIVE mg/dL
Hgb urine dipstick: NEGATIVE
Ketones, ur: NEGATIVE mg/dL
Leukocytes,Ua: NEGATIVE
Nitrite: NEGATIVE
Protein, ur: NEGATIVE mg/dL
Specific Gravity, Urine: 1.005 (ref 1.005–1.030)
pH: 7.5 (ref 5.0–8.0)

## 2023-11-03 LAB — CBC WITH DIFFERENTIAL/PLATELET
Abs Immature Granulocytes: 0.02 10*3/uL (ref 0.00–0.07)
Basophils Absolute: 0 10*3/uL (ref 0.0–0.1)
Basophils Relative: 0 %
Eosinophils Absolute: 0.1 10*3/uL (ref 0.0–0.5)
Eosinophils Relative: 2 %
HCT: 36.9 % — ABNORMAL LOW (ref 39.0–52.0)
Hemoglobin: 12.4 g/dL — ABNORMAL LOW (ref 13.0–17.0)
Immature Granulocytes: 0 %
Lymphocytes Relative: 29 %
Lymphs Abs: 1.5 10*3/uL (ref 0.7–4.0)
MCH: 30.5 pg (ref 26.0–34.0)
MCHC: 33.6 g/dL (ref 30.0–36.0)
MCV: 90.9 fL (ref 80.0–100.0)
Monocytes Absolute: 0.5 10*3/uL (ref 0.1–1.0)
Monocytes Relative: 9 %
Neutro Abs: 3.2 10*3/uL (ref 1.7–7.7)
Neutrophils Relative %: 60 %
Platelets: 185 10*3/uL (ref 150–400)
RBC: 4.06 MIL/uL — ABNORMAL LOW (ref 4.22–5.81)
RDW: 13.5 % (ref 11.5–15.5)
WBC: 5.3 10*3/uL (ref 4.0–10.5)
nRBC: 0 % (ref 0.0–0.2)

## 2023-11-03 MED ORDER — ACETAMINOPHEN 500 MG PO TABS
1000.0000 mg | ORAL_TABLET | Freq: Once | ORAL | Status: AC
  Administered 2023-11-03: 1000 mg via ORAL
  Filled 2023-11-03: qty 2

## 2023-11-03 MED ORDER — KETOROLAC TROMETHAMINE 15 MG/ML IJ SOLN
15.0000 mg | Freq: Once | INTRAMUSCULAR | Status: AC
  Administered 2023-11-03: 15 mg via INTRAVENOUS
  Filled 2023-11-03: qty 1

## 2023-11-03 MED ORDER — DICLOFENAC SODIUM 1 % EX GEL
4.0000 g | Freq: Four times a day (QID) | CUTANEOUS | 0 refills | Status: DC
Start: 2023-11-03 — End: 2024-07-14
  Filled 2023-11-03: qty 100, 16d supply, fill #0

## 2023-11-03 MED ORDER — SODIUM CHLORIDE 0.9 % IV BOLUS
1000.0000 mL | Freq: Once | INTRAVENOUS | Status: AC
  Administered 2023-11-03: 1000 mL via INTRAVENOUS

## 2023-11-03 NOTE — ED Notes (Signed)
Pt is incontinent of bladder and unable to give urine sample.

## 2023-11-03 NOTE — ED Triage Notes (Signed)
Pt caox4 c/o L lower back pain for approx 2 weeks, pain worsens on movement and palpation but has been constant. Pt denies any recent fall/trauma or event correlating to when the pain started. Pt denies urinary s/s. Pt was prescribed tramadol yesterday by PCP which has helped with the pain.

## 2023-11-03 NOTE — Discharge Instructions (Addendum)
Your CT scan did not show an obvious cause for your symptoms.  It did show that you have something that is called an aortic aneurysm.  This is something that needs to be followed up with repeat imaging to make sure it is not getting bigger.  Typically this is followed through vascular surgeon.  You can follow-up with your family doctor but I have given you information to follow-up with the vascular surgeon as well.  There is also a lesion in your right lung.  Recommended to get a CT scan of that as well.  Please discuss this with your family doctor.  I am going to treat this like it is musculoskeletal.  Please follow-up with your doctor in the office.  Your back pain is most likely due to a muscular strain.  There is been a lot of research on back pain, unfortunately the only thing that seems to really help is Tylenol and ibuprofen.  Relative rest is also important to not lift greater than 10 pounds bending or twisting at the waist.  Please follow-up with your family physician.  The other thing that really seems to benefit patients is physical therapy which your doctor may send you for.  Please return to the emergency department for new numbness or weakness to your arms or legs. Difficulty with urinating or urinating or pooping on yourself.  Also if you cannot feel toilet paper when you wipe or get a fever.   Use the gel as prescribed. Also take tylenol 1000mg (2 extra strength) four times a day.

## 2023-11-03 NOTE — ED Notes (Signed)
Patient transported to CT 

## 2023-11-03 NOTE — ED Provider Notes (Addendum)
[facility] DEPARTMENT AT [facility] Provider Note   CSN: [id number] Arrival date & time: 11/03/23  1300     History  Chief Complaint  Patient presents with   Back Pain    Kevin Pacheco. is a 82 y.o. male.  82 yo M with a chief complaints of left-sided low back pain.  This been going on for about a week and a half.  Worse with certain movements palpation twisting.  No fevers no radiation of the pain.  He does have bladder incontinence and has had it since about the year 2000 and since he had a radical prostatectomy for prostate cancer.  He denies numbness or weakness to the leg.  His doctor prescribed him tramadol which he took 1 this morning and feels a bit better now.  He denies trauma.  Denies fevers.  Denies urinary symptoms otherwise.   Back Pain      Home Medications Prior to Admission medications   Medication Sig Start Date End Date Taking? Authorizing Provider  diclofenac Sodium (VOLTAREN) 1 % GEL Apply 4 g topically 4 (four) times daily. 11/03/23  Yes Melene Plan, DO  potassium chloride SA (KLOR-CON M) 20 MEQ tablet Take 20 mEq by mouth daily. 10/08/23  Yes [provider]  abiraterone acetate (ZYTIGA) 250 MG tablet Take 1,000 mg by mouth daily. 08/04/22   [provider]  alendronate (FOSAMAX) 70 MG tablet Take 70 mg by mouth once a week. 01/17/22   [provider]  amLODipine (NORVASC) 10 MG tablet TAKE 1 TABLET EVERY DAY 06/22/23   Yates Decamp, MD  aspirin EC 81 MG tablet Take 81 mg by mouth daily. Swallow whole.    [provider]  aspirin-sod bicarb-citric acid (ALKA-SELTZER) 325 MG TBEF tablet Take 650 mg by mouth every 6 (six) hours as needed (indigestion).    [provider]  atorvastatin (LIPITOR) 20 MG tablet TAKE 1 TABLET EVERY DAY 09/02/23   Yates Decamp, MD  leuprolide (LUPRON) 7.5 MG injection Inject 7.5 mg into the muscle every 3 (three) months.    [provider]  metoprolol  tartrate (LOPRESSOR) 50 MG tablet Take 1 tablet (50 mg total) by mouth 2 (two) times daily. 08/18/22   Yates Decamp, MD  olmesartan (BENICAR) 40 MG tablet TAKE 1 TABLET EVERY DAY (DISCONTINUE AVAPRO) 12/03/21   Yates Decamp, MD  omeprazole (PRILOSEC) 40 MG capsule TAKE 1 CAPSULE EVERY DAY AS NEEDED FOR ACID REFLUX (REFILLS TO DR. Guerry Bruin) Patient taking differently: 40 mg in the morning and at bedtime. 11/04/22   Yates Decamp, MD  predniSONE (DELTASONE) 5 MG tablet Take 5 mg by mouth daily. 08/05/22   [provider]      Allergies    Patient has no known allergies.    Review of Systems   Review of Systems  Musculoskeletal:  Positive for back pain.    Physical Exam Updated Vital Signs BP 136/70 (BP Location: Right Arm)   Pulse (!) 57   Temp 98.3 F (36.8 C)   Resp 18   Ht 5\' 9"  (1.753 m)   Wt 90.7 kg   SpO2 99%   BMI 29.53 kg/m  Physical Exam Vitals and nursing note reviewed.  Constitutional:      Appearance: He is well-developed.  HENT:     Head: Normocephalic and atraumatic.  Eyes:     Pupils: Pupils are equal, round, and reactive to light.  Neck:     Vascular: No JVD.  Cardiovascular:  Rate and Rhythm: Normal rate and regular rhythm.     Heart sounds: No murmur heard.    No friction rub. No gallop.  Pulmonary:     Effort: No respiratory distress.     Breath sounds: No wheezing.  Abdominal:     General: There is no distension.     Tenderness: There is no abdominal tenderness. There is no guarding or rebound.  Musculoskeletal:        General: Tenderness present. Normal range of motion.     Cervical back: Normal range of motion and neck supple.     Comments: Figured out left-sided low back pain.  Reproduced on exam.  Pain is just below the costal margin on the left about the musculature with some spasm.  No obvious midline spinal tenderness step-offs or deformities.  Pulse motor and sensation intact to the left lower extremity.  Reflexes 2+ and equal.  No  clonus.  Skin:    Coloration: Skin is not pale.     Findings: No rash.  Neurological:     Mental Status: He is alert and oriented to person, place, and time.  Psychiatric:        Behavior: Behavior normal.     ED Results / Procedures / Treatments   Labs (all labs ordered are listed, but only abnormal results are displayed) Labs Reviewed  CBC WITH DIFFERENTIAL/PLATELET - Abnormal; Notable for the following components:      Result Value   RBC 4.06 (*)    Hemoglobin 12.4 (*)    HCT 36.9 (*)    All other components within normal limits  BASIC METABOLIC PANEL - Abnormal; Notable for the following components:   Glucose, Bld 109 (*)    Calcium 10.4 (*)    All other components within normal limits  URINALYSIS, ROUTINE W REFLEX MICROSCOPIC    EKG None  Radiology No results found.  Procedures Procedures    Medications Ordered in ED Medications  sodium chloride 0.9 % bolus 1,000 mL (1,000 mLs Intravenous New Bag/Given 11/03/23 1400)  acetaminophen (TYLENOL) tablet 1,000 mg (1,000 mg Oral Given 11/03/23 1352)  ketorolac (TORADOL) 15 MG/ML injection 15 mg (15 mg Intravenous Given 11/03/23 1401)    ED Course/ Medical Decision Making/ A&P                                 Medical Decision Making Amount and/or Complexity of Data Reviewed Labs: ordered. Radiology: ordered.  Risk OTC drugs. Prescription drug management.   82 yo M with new back pain going on about a week and a half patient is over 49 and has a history of prostate cancer.  Will obtain CT imaging.  Lab work.  UA.  Treat pain.  Reassess.  No acute anemia, no significant electrolyte abnormalities.    The UA is resulted without obvious infection.  CT of the L-spine without obvious acute fracture or metastasis.  The patient has a AAA on CT.  Slightly larger than previous.  I discussed this with the patient and encouraged him to follow-up.  He also has a right lower lobe cyst.  I discussed this with him as  well.  Encouraged him to follow-up with his doctor for outpatient CT.  The patients results and plan were reviewed and discussed.   Any x-rays performed were independently reviewed by myself.   Differential diagnosis were considered with the presenting HPI.  Medications  sodium chloride 0.9 %  bolus 1,000 mL (1,000 mLs Intravenous New Bag/Given 11/03/23 1400)  acetaminophen (TYLENOL) tablet 1,000 mg (1,000 mg Oral Given 11/03/23 1352)  ketorolac (TORADOL) 15 MG/ML injection 15 mg (15 mg Intravenous Given 11/03/23 1401)    Vitals:   11/03/23 1308 11/03/23 1330 11/03/23 1334  BP: 136/70    Pulse: (!) 57    Resp: 18    Temp: 98.3 F (36.8 C)    SpO2: 99% 99%   Weight:   90.7 kg  Height:   5\' 9"  (1.753 m)    Final diagnoses:  Left flank pain    Admission/ observation were discussed with the admitting physician, patient and/or family and they are comfortable with the plan.          Final Clinical Impression(s) / ED Diagnoses Final diagnoses:  Left flank pain    Rx / DC Orders ED Discharge Orders          Ordered    diclofenac Sodium (VOLTAREN) 1 % GEL  4 times daily        11/03/23 1513               Melene Plan, DO 11/03/23 1541

## 2023-11-05 ENCOUNTER — Other Ambulatory Visit: Payer: Self-pay | Admitting: Family Medicine

## 2023-11-05 DIAGNOSIS — R918 Other nonspecific abnormal finding of lung field: Secondary | ICD-10-CM

## 2023-11-17 ENCOUNTER — Emergency Department (HOSPITAL_BASED_OUTPATIENT_CLINIC_OR_DEPARTMENT_OTHER)
Admission: EM | Admit: 2023-11-17 | Discharge: 2023-11-17 | Disposition: A | Discharge status: Home / Self Care | Payer: Medicare HMO | Attending: Emergency Medicine | Admitting: Emergency Medicine

## 2023-11-17 ENCOUNTER — Emergency Department (HOSPITAL_BASED_OUTPATIENT_CLINIC_OR_DEPARTMENT_OTHER): Payer: Medicare HMO

## 2023-11-17 DIAGNOSIS — M545 Low back pain, unspecified: Secondary | ICD-10-CM | POA: Diagnosis present

## 2023-11-17 DIAGNOSIS — Z79899 Other long term (current) drug therapy: Secondary | ICD-10-CM | POA: Diagnosis not present

## 2023-11-17 DIAGNOSIS — K5900 Constipation, unspecified: Secondary | ICD-10-CM | POA: Diagnosis not present

## 2023-11-17 DIAGNOSIS — Z7982 Long term (current) use of aspirin: Secondary | ICD-10-CM | POA: Diagnosis not present

## 2023-11-17 DIAGNOSIS — G8929 Other chronic pain: Secondary | ICD-10-CM | POA: Insufficient documentation

## 2023-11-17 MED ORDER — NAPROXEN 375 MG PO TABS
375.0000 mg | ORAL_TABLET | Freq: Two times a day (BID) | ORAL | 0 refills | Status: DC
Start: 2023-11-17 — End: 2024-07-14

## 2023-11-17 MED ORDER — LIDOCAINE 5 % EX PTCH
1.0000 | MEDICATED_PATCH | CUTANEOUS | Status: DC
  Administered 2023-11-17: 1 via TRANSDERMAL
  Filled 2023-11-17: qty 1

## 2023-11-17 MED ORDER — ACETAMINOPHEN 325 MG PO TABS
650.0000 mg | ORAL_TABLET | Freq: Once | ORAL | Status: AC
  Administered 2023-11-17: 650 mg via ORAL
  Filled 2023-11-17: qty 2

## 2023-11-17 MED ORDER — LIDOCAINE 4 % EX PTCH: 1.0000 | MEDICATED_PATCH | Freq: Two times a day (BID) | CUTANEOUS | 0 refills | Status: DC

## 2023-11-17 MED ORDER — NAPROXEN 250 MG PO TABS
500.0000 mg | ORAL_TABLET | Freq: Once | ORAL | Status: AC
  Administered 2023-11-17: 500 mg via ORAL
  Filled 2023-11-17: qty 2

## 2023-11-17 NOTE — Discharge Instructions (Signed)
We saw you in the emergency room for back pain and constipation.  For constipation, please make sure you are taking your fiber supplement daily.  Take MiraLAX or mag citrate only if constipation is worse despite dose medication.  Make sure you are drinking plenty of water.  Back pain workup in the ER recently was reassuring besides some degenerative spine disease.  We agree with the PCP follow-up and the CT scan of the chest that is planned later this year.  Please discuss your back pain with the primary care doctor to see if there alternative treatment options.

## 2023-11-17 NOTE — ED Provider Notes (Signed)
[place] EMERGENCY DEPARTMENT AT [facility] Provider Note   CSN: [id number] Arrival date & time: 11/17/23  1745     History  Chief Complaint  Patient presents with   Back Pain    Kevin Pacheco. is a 82 y.o. male.  HPI    82 year old male comes in with chief complaint of back pain. Patient has history of chronic back pain.  He states that the current pain started getting aggravated yesterday or day before.  The pain is fairly constant, worse with movement.  He has also been constipated for the last 5 days.  Today he took mag citrate, and had a small bowel movement.  He denies any nausea, vomiting.  He thinks he is passing flatus.  Review of system is negative for chest pain, shortness of breath.  Pain is located over the mid back and lower back.  Patient states that he was seen in the ER few weeks ago for the same type of pain.   Home Medications Prior to Admission medications   Medication Sig Start Date End Date Taking? Authorizing Provider  abiraterone acetate (ZYTIGA) 250 MG tablet Take 1,000 mg by mouth daily. 08/04/22   [provider]  alendronate (FOSAMAX) 70 MG tablet Take 70 mg by mouth once a week. 01/17/22   [provider]  amLODipine (NORVASC) 10 MG tablet TAKE 1 TABLET EVERY DAY 06/22/23   Yates Decamp, MD  aspirin EC 81 MG tablet Take 81 mg by mouth daily. Swallow whole.    [provider]  aspirin-sod bicarb-citric acid (ALKA-SELTZER) 325 MG TBEF tablet Take 650 mg by mouth every 6 (six) hours as needed (indigestion).    [provider]  atorvastatin (LIPITOR) 20 MG tablet TAKE 1 TABLET EVERY DAY 09/02/23   Yates Decamp, MD  diclofenac Sodium (VOLTAREN) 1 % GEL Apply 4 g topically 4 (four) times daily. 11/03/23   Melene Plan, DO  leuprolide (LUPRON) 7.5 MG injection Inject 7.5 mg into the muscle every 3 (three) months.    [provider]  metoprolol tartrate (LOPRESSOR) 50 MG tablet Take 1 tablet (50 mg total)  by mouth 2 (two) times daily. 08/18/22   Yates Decamp, MD  olmesartan (BENICAR) 40 MG tablet TAKE 1 TABLET EVERY DAY (DISCONTINUE AVAPRO) 12/03/21   Yates Decamp, MD  omeprazole (PRILOSEC) 40 MG capsule TAKE 1 CAPSULE EVERY DAY AS NEEDED FOR ACID REFLUX (REFILLS TO DR. Guerry Bruin) Patient taking differently: 40 mg in the morning and at bedtime. 11/04/22   Yates Decamp, MD  potassium chloride SA (KLOR-CON M) 20 MEQ tablet Take 20 mEq by mouth daily. 10/08/23   [provider]  predniSONE (DELTASONE) 5 MG tablet Take 5 mg by mouth daily. 08/05/22   [provider]      Allergies    Patient has no known allergies.    Review of Systems   Review of Systems  All other systems reviewed and are negative.   Physical Exam Updated Vital Signs BP 128/65   Pulse 62   Temp 98.3 F (36.8 C) (Oral)   Resp 16   Ht 5\' 9"  (1.753 m)   Wt 90 kg   SpO2 100%   BMI 29.30 kg/m  Physical Exam Vitals and nursing note reviewed.  Constitutional:      Appearance: He is well-developed.  HENT:     Head: Atraumatic.  Cardiovascular:     Rate and Rhythm: Normal rate.  Pulmonary:     Effort: Pulmonary effort  is normal.  Abdominal:     Tenderness: There is no abdominal tenderness.  Musculoskeletal:     Cervical back: Neck supple.     Comments: Pt has tenderness over the mid lumbar region No step offs, no erythema. Able to discriminate between sharp and dull. Able to ambulate   Skin:    General: Skin is warm.  Neurological:     Mental Status: He is alert and oriented to person, place, and time.     ED Results / Procedures / Treatments   Labs (all labs ordered are listed, but only abnormal results are displayed) Labs Reviewed - No data to display  EKG None  Radiology DG Abdomen 1 View Result Date: 11/17/2023 CLINICAL DATA:  Constipation. Low back pain for 2 months. Dizziness. EXAM: ABDOMEN - 1 VIEW COMPARISON:  CT abdomen and pelvis 11/03/2023 FINDINGS: Scattered gas-filled  small and large bowel without abnormal distention. No radiopaque stones. Visualized bones and soft tissue contours appear intact. Surgical clips in the pelvis. IMPRESSION: Normal nonobstructive bowel gas pattern. Electronically Signed   By: Burman Nieves M.D.   On: 11/17/2023 21:27    Procedures Procedures    Medications Ordered in ED Medications  lidocaine (LIDODERM) 5 % 1 patch (1 patch Transdermal Patch Applied 11/17/23 2059)  naproxen (NAPROSYN) tablet 500 mg (500 mg Oral Given 11/17/23 2100)  acetaminophen (TYLENOL) tablet 650 mg (650 mg Oral Given 11/17/23 2100)    ED Course/ Medical Decision Making/ A&P                                 Medical Decision Making Amount and/or Complexity of Data Reviewed Radiology: ordered.  Risk OTC drugs. Prescription drug management.  82 year old male comes in with chief complaint of back pain.  He also reports constipation. Patient has history of chronic back pain and also chronic constipation.  He denies any chest pain, shortness of breath.  Patient was seen in the ER few months back.  At that time he had a CT renal stone protocol and CT L-spine.  Subsequently was found to have 4 cm aneurysm.  He also has a CT chest planned for later this week to rule out malignancy.  On exam, abdomen is soft.  Patient has midline thoracic and lumbar spine tenderness.  He has no neurologic deficits.  Differential diagnosis considered for this patient includes ileus, small bowel obstruction, chronic spine pain,  Metastatic disease to the spine, aneurysm related pain.  The abdominal exam, vascular exam is overall reassuring.  Vital signs are stable and within normal limit.  Will get KUB to evaluate for significant air-fluid level.  Clinically, patient likely has ileus but not small bowel obstruction, as he is passing flatus and does not have any nausea or vomiting.  I reviewed previous records including CT scans.  I do not think repeat spine imaging is  indicated.  9:58 PM KUB shows some evidence of constipation.  No free air.  Stable for discharge at this point with the symptom management and PCP follow-up as planned for later this week.  Final Clinical Impression(s) / ED Diagnoses Final diagnoses:  Constipation, unspecified constipation type  Chronic midline low back pain without sciatica    Rx / DC Orders ED Discharge Orders     None         Derwood Kaplan, MD 11/17/23 2159

## 2023-11-17 NOTE — ED Triage Notes (Signed)
C/o lower back pain x 2 months. No known injuries. Recently seen here for same. Also endorses dizziness and constipation.

## 2023-11-24 ENCOUNTER — Ambulatory Visit
Admission: RE | Admit: 2023-11-24 | Discharge: 2023-11-24 | Disposition: A | Discharge status: Home / Self Care | Payer: Medicare HMO | Source: Ambulatory Visit | Attending: Family Medicine | Admitting: Family Medicine

## 2023-11-24 DIAGNOSIS — R918 Other nonspecific abnormal finding of lung field: Secondary | ICD-10-CM

## 2023-11-24 MED ORDER — IOPAMIDOL (ISOVUE-300) INJECTION 61%
500.0000 mL | Freq: Once | INTRAVENOUS | Status: AC | PRN
  Administered 2023-11-24: 75 mL via INTRAVENOUS

## 2023-12-16 NOTE — Therapy (Unsigned)
OUTPATIENT PHYSICAL THERAPY THORACOLUMBAR EVALUATION   Patient Name: Kevin Pacheco. MRN: [id number] DOB:March 19, 1941, 83 y.o., male Today's Date: 12/17/2023  END OF SESSION:  PT End of Session - 12/17/23 0849     Visit Number 1    Number of Visits 12    Date for PT Re-Evaluation 01/28/24    Authorization Type [employer] M[employer]    PT Start Time 0845    PT Stop Time 0935    PT Time Calculation (min) 50 min             Past Medical History:  Diagnosis Date   AAA (abdominal aortic aneurysm) (HCC) 12/15/2019   A moderate sized abdominal aortic aneurysm measuring 4.03 x 4.01 x 4.24 cm is seen in the mid aorta.   Arthritis    Bilateral carotid artery stenosis    Essential hypertension 12/30/2018   GERD (gastroesophageal reflux disease)    H/O tobacco use, presenting hazards to health 12/30/2018   > 50-60 pack year quit 2012   Headache(784.0)    History of glaucoma    Incomplete RBBB 12/29/2019   Noted on EKG   Prostate cancer (HCC)    Pure hypercholesterolemia 12/30/2018   Sickle cell trait (HCC)    Urinary incontinence    Past Surgical History:  Procedure Laterality Date   ARTHROSCOPY KNEE W/ DRILLING Left 05/01/10   BLADDER TUMOR EXCISION  2009   CARDIAC CATHETERIZATION     hx 1980's can't remember M D    CATARACT EXTRACTION, BILATERAL     COLONOSCOPY  2005, 2012   FRACTURE SURGERY Right    ankle   I & D KNEE WITH POLY EXCHANGE Left 03/14/2013   Procedure: total knee revision;  Surgeon: Dannielle Huh, MD;  Location: [facility] OR;  Service: Orthopedics;  Laterality: Left;   JOINT REPLACEMENT Left 02/06/11   KNEE ARTHROPLASTY Right 04/18/2020   Procedure: COMPUTER ASSISTED TOTAL KNEE ARTHROPLASTY;  Surgeon: Samson Frederic, MD;  Location: [facility] ORS;  Service: Orthopedics;  Laterality: Right;   PROSTATECTOMY  2001   urethal sling  2009   Patient Active Problem List   Diagnosis Date Noted   Iliac artery aneurysm (HCC) 03/25/2022   Osteoarthritis of right knee 04/18/2020   Essential  hypertension 12/30/2018   Pure hypercholesterolemia 12/30/2018   H/O tobacco use, presenting hazards to health 12/30/2018   Sickle cell trait (HCC) 12/30/2018   GERD (gastroesophageal reflux disease) 12/30/2018   Thoracic aorta atherosclerosis (HCC) 12/30/2018   Prostate cancer (HCC) 10/26/2015   Abdominal aortic aneurysm (AAA) >39 mm diameter (HCC) 11/08/1917    PCP: Lisbeth Renshaw, MD  REFERRING PROVIDER: Lisbeth Renshaw, MD  REFERRING DIAG: [phone] (ICD-10-CM) - Lumbar spondylosis  Rationale for Evaluation and Treatment: Rehabilitation  THERAPY DIAG:  Other low back pain - Plan: PT plan of care cert/re-cert  Joint stiffness of spine - Plan: PT plan of care cert/re-cert  ONSET DATE: acute on chronic  SUBJECTIVE:  SUBJECTIVE STATEMENT: Pt with chronic low back pain since 1980's from a fracture.  He also fell in 2017.  He reports it is getting worse.  The pain is more severe at times and the pain is daily. The pain radiates into his Rt leg but stops at the knee.  The pain limits him from walking , sitting up straight.  He limits what he lifts at home. At times he feels RT leg is weaker but that may be because of the knee (THR 2021).  He has a [employer] and used to do pool exercises but he has not been there in 8-9 mos.   PERTINENT HISTORY:  Chronic low back pain, Rt TKA   PAIN:  Are you having pain? Yes: NPRS scale: 8/10 Pain location: Low back Rt side  Pain description: dull Aggravating factors: walking 15 min or more, sitting too long Relieving factors: Alkaseltzer, changing positioning   PRECAUTIONS: Other: none   RED FLAGS: None   WEIGHT BEARING RESTRICTIONS: No  FALLS:  Has patient fallen in last 6 months? No  LIVING ENVIRONMENT: Lives with: lives with their  spouse Lives in: House/apartment Stairs: No Has following equipment at home: Single point cane walking stick .  2 weeks ago he was using the cane   OCCUPATION: Retired, was a Production assistant, radio, Repair equipment [employer]  PLOF: Independent  PATIENT GOALS: [facility]   NEXT MD VISIT: unsure   OBJECTIVE:  Note: Objective measures were completed at Evaluation unless otherwise noted.  DIAGNOSTIC FINDINGS:  01/2024:  IMPRESSION: 1. L3-L4 moderate to severe spinal canal stenosis with mild bilateral neural foraminal narrowing. Effacement of the right lateral recess likely compresses the descending right L4 nerve roots. Narrowing of the left lateral recess could also affect the descending left L4 nerve roots. 2. L4-L5 moderate spinal canal stenosis with mild left neural foraminal narrowing. Narrowing of the lateral recesses at this level could affect the descending L5 nerve roots. 3. L2-L3 mild-to-moderate spinal canal stenosis. 4. No evidence of metastatic disease in the lumbar spine. 5. No acute fracture or traumatic listhesis in the thoracic or lumbar spine. 6. No significant spinal canal stenosis or neural foraminal narrowing in the thoracic spine. 7. Redemonstrated infrarenal abdominal aortic aneurysm, which measures up to 4.4 cm, similar to the 2019 CTA when accounting for differences in technique.    PATIENT SURVEYS:  FOTO 67% goal is 68%, no goal set   COGNITION: Overall cognitive status: Within functional limits for tasks assessed     SENSATION: WFL  MUSCLE LENGTH: Hamstrings:Passive to about 40 deg  Thomas test: NT but tightness present bilateral   POSTURE: rounded shoulders and forward head  PALPATION: Soreness to palpation in prone to Rt mid to lower lumbar paraspinals, stiffness throughout T-L spine   LUMBAR ROM:   AROM eval  Flexion Mid shin min pain   Extension Felt good 25% limited    Right  lateral flexion 50% min pain   Left lateral flexion 50% min pain   Right rotation 25% min pain   Left rotation 25% min pain    (Blank rows = not tested)  LOWER EXTREMITY ROM:     Passive  Right eval Left eval  Hip flexion WNL WNL   Hip extension    Hip abduction    Hip adduction    Hip internal rotation Tight  Tight   Hip external rotation Felt good Felt good  Knee flexion [facility]   Knee extension    Ankle dorsiflexion    Ankle plantarflexion    Ankle inversion    Ankle eversion     (Blank rows = not tested)  LOWER EXTREMITY MMT:    MMT Right eval Left eval  Hip flexion 5 5  Hip extension 4 4  Hip abduction 4 4  Hip adduction    Hip internal rotation    Hip external rotation    Knee flexion 5 5  Knee extension 5 5  Ankle dorsiflexion 5 5  Ankle plantarflexion    Ankle inversion    Ankle eversion     (Blank rows = not tested)  LUMBAR SPECIAL TESTS:  Straight leg raise test: Negative  FUNCTIONAL TESTS:  5 times sit to stand: 17 sec no UEs   GAIT: Distance walked: 100  Assistive device utilized: None Level of assistance: Complete [place] Comments: NA   TREATMENT DATE:        [facility] Adult PT Treatment:                                                DATE: 12/17/23 Therapeutic Exercise: Performed a few reps of HEP: LTR, SKTC, hamstring stretch and bridging Self Care: Mobility, aquatics, MRI findings from 2024, stenosis                                                                                                                        PATIENT EDUCATION:  Education details: HEP, see above  Person educated: Patient Education method: Explanation, Demonstration, Verbal cues, and Handouts Education comprehension: verbalized understanding and needs further education  HOME EXERCISE PROGRAM: Access Code: [email or web address] URL: [email or web address] Date: 12/17/2023 Prepared by: Karie Mainland  Exercises - Supine Hamstring Stretch with Strap  -  1 x daily - 7 x weekly - 1 sets - 3 reps - 30 hold - Supine Single Knee to Chest Stretch  - 1 x daily - 7 x weekly - 1 sets - 3 reps - 30 hold - Supine Bridge  - 1 x daily - 7 x weekly - 2 sets - 10 reps - 5 hold - Supine Lower Trunk Rotation  - 1 x daily - 7 x weekly - 2 sets - 10 reps - 10 hold  ASSESSMENT:  CLINICAL IMPRESSION: Patient is a 83 y.o. male  who was seen today for physical therapy evaluation and treatment for low back pain due to lumbar spondylosis, stenosis.   OBJECTIVE IMPAIRMENTS: decreased knowledge of condition, decreased mobility, difficulty walking, decreased ROM, decreased strength, hypomobility, increased fascial restrictions, impaired flexibility, postural dysfunction, and pain.   ACTIVITY LIMITATIONS: carrying, lifting, bending, sitting, standing, squatting, sleeping, and locomotion level  PARTICIPATION LIMITATIONS: cleaning, shopping, community activity, and yard work  PERSONAL FACTORS: Age, Time since onset of injury/illness/exacerbation, and 1 comorbidity: Rt knee  pain   are also affecting patient's functional outcome.   REHAB POTENTIAL: Excellent  CLINICAL DECISION MAKING: Stable/uncomplicated  EVALUATION COMPLEXITY: Low   GOALS: Goals reviewed with patient? Yes  SHORT TERM GOALS= LONG TERM GOALS Target date: 01/28/2024    Pt will be I with HEP for trunk mobility and strength Baseline: Goal status: INITIAL  2.  Pt will be able to walk 2 miles with min increase in back pain [facility]  Baseline:  Goal status: INITIAL  3.  Pt will be able to report improved ability to sit for meals, not limited by pain for 30 min  Baseline:  Goal status: INITIAL  4.  Pt will be able to report no pain with trunk ROM all planes  Baseline: min pain with rotation and sidebending  Goal status: INITIAL  5.  Pt will be able to lift items from the floor with good form (less than 20 lbs) and no increased pain  Baseline:  Goal status: INITIAL  PLAN:  PT  FREQUENCY: 2x/week  PT DURATION: 6 weeks  PLANNED INTERVENTIONS: 97164- PT Re-evaluation, 97110-Therapeutic exercises, 97530- Therapeutic activity, 97112- Neuromuscular re-education, 97535- Self Care, 16109- Manual therapy, [phone]- Aquatic Therapy, Patient/Family education, Balance training, Dry Needling, Spinal mobilization, Cryotherapy, and Moist heat.  PLAN FOR NEXT SESSION: check HEP, Nustep.2 min walk test, core routine    Everhett Bozard, PT 12/17/2023, 9:56 AM   Karie Mainland, PT 12/17/23 9:56 AM Phone: [phone] Fax: [phone]

## 2023-12-17 ENCOUNTER — Ambulatory Visit: Payer: Medicare HMO | Attending: Neurosurgery | Admitting: Physical Therapy

## 2023-12-17 DIAGNOSIS — M5459 Other low back pain: Secondary | ICD-10-CM | POA: Insufficient documentation

## 2023-12-17 DIAGNOSIS — M2560 Stiffness of unspecified joint, not elsewhere classified: Secondary | ICD-10-CM | POA: Diagnosis present

## 2023-12-29 ENCOUNTER — Encounter: Payer: Self-pay | Admitting: Physical Therapy

## 2023-12-29 ENCOUNTER — Ambulatory Visit: Payer: Medicare HMO | Attending: Neurosurgery | Admitting: Physical Therapy

## 2023-12-29 DIAGNOSIS — M2560 Stiffness of unspecified joint, not elsewhere classified: Secondary | ICD-10-CM | POA: Insufficient documentation

## 2023-12-29 DIAGNOSIS — M5459 Other low back pain: Secondary | ICD-10-CM | POA: Insufficient documentation

## 2023-12-29 NOTE — Therapy (Signed)
 OUTPATIENT PHYSICAL THERAPY THORACOLUMBAR TREATMENT   Patient Name: Kevin Pacheco. MRN: [id number] DOB:Apr 06, 1941, 83 y.o., male Today's Date: 12/29/2023  END OF SESSION:  PT End of Session - 12/29/23 1020     Visit Number 2    Number of Visits 12    Date for PT Re-Evaluation 01/28/24    Authorization Type [employer] MCR    PT Start Time 1017    PT Stop Time 1100    PT Time Calculation (min) 43 min             Past Medical History:  Diagnosis Date   AAA (abdominal aortic aneurysm) (HCC) 12/15/2019   A moderate sized abdominal aortic aneurysm measuring 4.03 x 4.01 x 4.24 cm is seen in the mid aorta.   Arthritis    Bilateral carotid artery stenosis    Essential hypertension 12/30/2018   GERD (gastroesophageal reflux disease)    H/O tobacco use, presenting hazards to health 12/30/2018   > 50-60 pack year quit 2012   Headache(784.0)    History of glaucoma    Incomplete RBBB 12/29/2019   Noted on EKG   Prostate cancer (HCC)    Pure hypercholesterolemia 12/30/2018   Sickle cell trait (HCC)    Urinary incontinence    Past Surgical History:  Procedure Laterality Date   ARTHROSCOPY KNEE W/ DRILLING Left 05/01/10   BLADDER TUMOR EXCISION  2009   CARDIAC CATHETERIZATION     hx 1980's can't remember M D    CATARACT EXTRACTION, BILATERAL     COLONOSCOPY  2005, 2012   FRACTURE SURGERY Right    ankle   I & D KNEE WITH POLY EXCHANGE Left 03/14/2013   Procedure: total knee revision;  Surgeon: Marcey Raman, MD;  Location: [facility] OR;  Service: Orthopedics;  Laterality: Left;   JOINT REPLACEMENT Left 02/06/11   KNEE ARTHROPLASTY Right 04/18/2020   Procedure: COMPUTER ASSISTED TOTAL KNEE ARTHROPLASTY;  Surgeon: Fidel Rogue, MD;  Location: [facility] ORS;  Service: Orthopedics;  Laterality: Right;   PROSTATECTOMY  2001   urethal sling  2009   Patient Active Problem List   Diagnosis Date Noted   Iliac artery aneurysm (HCC) 03/25/2022   Osteoarthritis of right knee 04/18/2020   Essential  hypertension 12/30/2018   Pure hypercholesterolemia 12/30/2018   H/O tobacco use, presenting hazards to health 12/30/2018   Sickle cell trait (HCC) 12/30/2018   GERD (gastroesophageal reflux disease) 12/30/2018   Thoracic aorta atherosclerosis (HCC) 12/30/2018   Prostate cancer (HCC) 10/26/2015   Abdominal aortic aneurysm (AAA) >39 mm diameter (HCC) 11/08/1917    PCP: Lanis Pupa, MD  REFERRING PROVIDER: Lanis Pupa, MD  REFERRING DIAG: [phone] (ICD-10-CM) - Lumbar spondylosis  Rationale for Evaluation and Treatment: Rehabilitation  THERAPY DIAG:  Other low back pain  Joint stiffness of spine  ONSET DATE: acute on chronic  SUBJECTIVE:  SUBJECTIVE STATEMENT: I did the exercises some.    Pt with chronic low back pain since 1980's from a fracture.  He also fell in 2017.  He reports it is getting worse.  The pain is more severe at times and the pain is daily. The pain radiates into his Rt leg but stops at the knee.  The pain limits him from walking , sitting up straight.  He limits what he lifts at home. At times he feels RT leg is weaker but that may be because of the knee (THR 2021).  He has a [employer] and used to do pool exercises but he has not been there in 8-9 mos.   PERTINENT HISTORY:  Chronic low back pain, Rt TKA   PAIN:  Are you having pain? Yes: NPRS scale: 7/10 Pain location: Low back Rt side  Pain description: dull Aggravating factors: walking 15 min or more, sitting too long Relieving factors: Alkaseltzer, changing positioning   PRECAUTIONS: Other: none   RED FLAGS: None   WEIGHT BEARING RESTRICTIONS: No  FALLS:  Has patient fallen in last 6 months? No  LIVING ENVIRONMENT: Lives with: lives with their spouse Lives in: House/apartment Stairs: No Has  following equipment at home: Single point cane walking stick .  2 weeks ago he was using the cane   OCCUPATION: Retired, was a production assistant, radio, Repair equipment [employer]  PLOF: Independent  PATIENT GOALS: [facility]   NEXT MD VISIT: unsure   OBJECTIVE:  Note: Objective measures were completed at Evaluation unless otherwise noted.  DIAGNOSTIC FINDINGS:  01/2024:  IMPRESSION: 1. L3-L4 moderate to severe spinal canal stenosis with mild bilateral neural foraminal narrowing. Effacement of the right lateral recess likely compresses the descending right L4 nerve roots. Narrowing of the left lateral recess could also affect the descending left L4 nerve roots. 2. L4-L5 moderate spinal canal stenosis with mild left neural foraminal narrowing. Narrowing of the lateral recesses at this level could affect the descending L5 nerve roots. 3. L2-L3 mild-to-moderate spinal canal stenosis. 4. No evidence of metastatic disease in the lumbar spine. 5. No acute fracture or traumatic listhesis in the thoracic or lumbar spine. 6. No significant spinal canal stenosis or neural foraminal narrowing in the thoracic spine. 7. Redemonstrated infrarenal abdominal aortic aneurysm, which measures up to 4.4 cm, similar to the 2019 CTA when accounting for differences in technique.    PATIENT SURVEYS:  FOTO 67% goal is 68%, no goal set   COGNITION: Overall cognitive status: Within functional limits for tasks assessed     SENSATION: WFL  MUSCLE LENGTH: Hamstrings:Passive to about 40 deg  Thomas test: NT but tightness present bilateral   POSTURE: rounded shoulders and forward head  PALPATION: Soreness to palpation in prone to Rt mid to lower lumbar paraspinals, stiffness throughout T-L spine   LUMBAR ROM:   AROM eval  Flexion Mid shin min pain   Extension Felt good 25% limited    Right lateral flexion 50% min pain   Left lateral flexion 50%  min pain   Right rotation 25% min pain   Left rotation 25% min pain    (Blank rows = not tested)  LOWER EXTREMITY ROM:     Passive  Right eval Left eval  Hip flexion WNL WNL   Hip extension    Hip abduction    Hip adduction    Hip internal rotation Tight  Tight  Hip external rotation Felt good Felt good   Knee flexion [facility]   Knee extension    Ankle dorsiflexion    Ankle plantarflexion    Ankle inversion    Ankle eversion     (Blank rows = not tested)  LOWER EXTREMITY MMT:    MMT Right eval Left eval  Hip flexion 5 5  Hip extension 4 4  Hip abduction 4 4  Hip adduction    Hip internal rotation    Hip external rotation    Knee flexion 5 5  Knee extension 5 5  Ankle dorsiflexion 5 5  Ankle plantarflexion    Ankle inversion    Ankle eversion     (Blank rows = not tested)  LUMBAR SPECIAL TESTS:  Straight leg raise test: Negative  FUNCTIONAL TESTS:  5 times sit to stand: 17 sec no UEs  12/29/23: 452 feet   GAIT: Distance walked: 100  Assistive device utilized: None Level of assistance: Complete Independence Comments: NA   TREATMENT DATE:  [facility] Adult PT Treatment:                                                DATE: 12/29/23 Therapeutic Exercise: Nustep L5 UE/Le x 5 min SKTC x 3 each  Bridge 10 x 2  LTR 10 sec x 5 each way  Hamstring stretch 90/90 , 10 sec x 5 each  DKTC  20 sec x 3   Supine Marching  Supine Piriformis stretch 20 sec x  2 each  Seated Lumbar Flexion 10 sec x 3   Therapeutic Activity: 2 MWT 452 feet without AD           [facility] Adult PT Treatment:                                                DATE: 12/17/23 Therapeutic Exercise: Performed a few reps of HEP: LTR, SKTC, hamstring stretch and bridging Self Care: Mobility, aquatics, MRI findings from 2024, stenosis                                                                                                                        PATIENT EDUCATION:  Education details: HEP,  see above  Person educated: Patient Education method: Explanation, Demonstration, Verbal cues, and Handouts Education comprehension: verbalized understanding and needs further education  HOME EXERCISE PROGRAM: Access Code: [email or web address] URL: [email or web address] Date: 12/17/2023 Prepared by: Delon Norma  Exercises - Supine Hamstring Stretch with Strap  - 1 x daily - 7 x weekly - 1 sets - 3 reps - 30 hold - Supine Single Knee to Chest Stretch  - 1 x daily - 7 x weekly - 1 sets -  3 reps - 30 hold - Supine Bridge  - 1 x daily - 7 x weekly - 2 sets - 10 reps - 5 hold - Supine Lower Trunk Rotation  - 1 x daily - 7 x weekly - 2 sets - 10 reps - 10 hold  ASSESSMENT:  CLINICAL IMPRESSION: Captured 2 MWT with pt walking 252 feet without AD. He did have increased pain in Right low back and RLE. Reviewed HEP and progressed with lumbar mobility, core and LE strengthening to increase tolerance to ADLs.    EVAL: Patient is a 84 y.o. male  who was seen today for physical therapy evaluation and treatment for low back pain due to lumbar spondylosis, stenosis.   OBJECTIVE IMPAIRMENTS: decreased knowledge of condition, decreased mobility, difficulty walking, decreased ROM, decreased strength, hypomobility, increased fascial restrictions, impaired flexibility, postural dysfunction, and pain.   ACTIVITY LIMITATIONS: carrying, lifting, bending, sitting, standing, squatting, sleeping, and locomotion level  PARTICIPATION LIMITATIONS: cleaning, shopping, community activity, and yard work  PERSONAL FACTORS: Age, Time since onset of injury/illness/exacerbation, and 1 comorbidity: Rt knee pain   are also affecting patient's functional outcome.   REHAB POTENTIAL: Excellent  CLINICAL DECISION MAKING: Stable/uncomplicated  EVALUATION COMPLEXITY: Low   GOALS: Goals reviewed with patient? Yes  SHORT TERM GOALS= LONG TERM GOALS Target date: 01/28/2024    Pt will be I with HEP for trunk mobility  and strength Baseline: Goal status: INITIAL  2.  Pt will be able to walk 2 miles with min increase in [facility]  Baseline:  Goal status: INITIAL  3.  Pt will be able to report improved ability to sit for meals, not limited by pain for 30 min  Baseline:  Goal status: INITIAL  4.  Pt will be able to report no pain with trunk ROM all planes  Baseline: min pain with rotation and sidebending  Goal status: INITIAL  5.  Pt will be able to lift items from the floor with good form (less than 20 lbs) and no increased pain  Baseline:  Goal status: INITIAL  PLAN:  PT FREQUENCY: 2x/week  PT DURATION: 6 weeks  PLANNED INTERVENTIONS: 97164- PT Re-evaluation, 97110-Therapeutic exercises, 97530- Therapeutic activity, 97112- Neuromuscular re-education, 97535- Self Care, 02859- Manual therapy, [phone]- Aquatic Therapy, Patient/Family education, Balance training, Dry Needling, Spinal mobilization, Cryotherapy, and Moist heat.  PLAN FOR NEXT SESSION: check HEP, Nustep,  core routine    Harlene Persons, PTA 12/29/23 1:11 PM Phone: [phone] Fax: [phone]   Delon Norma, PT 12/29/23 1:11 PM Phone: [phone] Fax: [phone]

## 2023-12-31 ENCOUNTER — Encounter: Payer: Self-pay | Admitting: Physical Therapy

## 2023-12-31 ENCOUNTER — Ambulatory Visit: Payer: Medicare HMO | Admitting: Physical Therapy

## 2023-12-31 DIAGNOSIS — M5459 Other low back pain: Secondary | ICD-10-CM | POA: Diagnosis not present

## 2023-12-31 DIAGNOSIS — M2560 Stiffness of unspecified joint, not elsewhere classified: Secondary | ICD-10-CM

## 2023-12-31 NOTE — Therapy (Signed)
 OUTPATIENT PHYSICAL THERAPY THORACOLUMBAR TREATMENT   Patient Name: Kevin Pacheco. MRN: [id number] DOB:11/10/1941, 83 y.o., male Today's Date: 12/31/2023  END OF SESSION:  PT End of Session - 12/31/23 0846     Visit Number 3    Number of Visits 12    Date for PT Re-Evaluation 01/28/24    Authorization Type [employer] MCR    PT Start Time 0845    PT Stop Time 0928    PT Time Calculation (min) 43 min             Past Medical History:  Diagnosis Date   AAA (abdominal aortic aneurysm) (HCC) 12/15/2019   A moderate sized abdominal aortic aneurysm measuring 4.03 x 4.01 x 4.24 cm is seen in the mid aorta.   Arthritis    Bilateral carotid artery stenosis    Essential hypertension 12/30/2018   GERD (gastroesophageal reflux disease)    H/O tobacco use, presenting hazards to health 12/30/2018   > 50-60 pack year quit 2012   Headache(784.0)    History of glaucoma    Incomplete RBBB 12/29/2019   Noted on EKG   Prostate cancer (HCC)    Pure hypercholesterolemia 12/30/2018   Sickle cell trait (HCC)    Urinary incontinence    Past Surgical History:  Procedure Laterality Date   ARTHROSCOPY KNEE W/ DRILLING Left 05/01/10   BLADDER TUMOR EXCISION  2009   CARDIAC CATHETERIZATION     hx 1980's can't remember M D    CATARACT EXTRACTION, BILATERAL     COLONOSCOPY  2005, 2012   FRACTURE SURGERY Right    ankle   I & D KNEE WITH POLY EXCHANGE Left 03/14/2013   Procedure: total knee revision;  Surgeon: Marcey Raman, MD;  Location: [facility] OR;  Service: Orthopedics;  Laterality: Left;   JOINT REPLACEMENT Left 02/06/11   KNEE ARTHROPLASTY Right 04/18/2020   Procedure: COMPUTER ASSISTED TOTAL KNEE ARTHROPLASTY;  Surgeon: Fidel Rogue, MD;  Location: [facility];  Service: Orthopedics;  Laterality: Right;   PROSTATECTOMY  2001   urethal sling  2009   Patient Active Problem List   Diagnosis Date Noted   Iliac artery aneurysm (HCC) 03/25/2022   Osteoarthritis of right knee 04/18/2020   Essential  hypertension 12/30/2018   Pure hypercholesterolemia 12/30/2018   H/O tobacco use, presenting hazards to health 12/30/2018   Sickle cell trait (HCC) 12/30/2018   GERD (gastroesophageal reflux disease) 12/30/2018   Thoracic aorta atherosclerosis (HCC) 12/30/2018   Prostate cancer (HCC) 10/26/2015   Abdominal aortic aneurysm (AAA) >39 mm diameter (HCC) 11/08/1917    PCP: Lanis Pupa, MD  REFERRING PROVIDER: Lanis Pupa, MD  REFERRING DIAG: [phone] (ICD-10-CM) - Lumbar spondylosis  Rationale for Evaluation and Treatment: Rehabilitation  THERAPY DIAG:  Other low back pain  Joint stiffness of spine  ONSET DATE: acute on chronic  SUBJECTIVE:  SUBJECTIVE STATEMENT: I did not complete HEP yesterday. Did well after last session. There is always a continuous pain but I just ignore it.    Pt with chronic low back pain since 1980's from a fracture.  He also fell in 2017.  He reports it is getting worse.  The pain is more severe at times and the pain is daily. The pain radiates into his Rt leg but stops at the knee.  The pain limits him from walking , sitting up straight.  He limits what he lifts at home. At times he feels RT leg is weaker but that may be because of the knee (THR 2021).  He has a [employer] and used to do pool exercises but he has not been there in 8-9 mos.   PERTINENT HISTORY:  Chronic low back pain, Rt TKA   PAIN:  Are you having pain? Yes: NPRS scale: 5/10 Pain location: Low back Rt side  Pain description: dull Aggravating factors: walking 15 min or more, sitting too long Relieving factors: Alkaseltzer, changing positioning   PRECAUTIONS: Other: none   RED FLAGS: None   WEIGHT BEARING RESTRICTIONS: No  FALLS:  Has patient fallen in last 6 months? No  LIVING  ENVIRONMENT: Lives with: lives with their spouse Lives in: House/apartment Stairs: No Has following equipment at home: Single point cane walking stick .  2 weeks ago he was using the cane   OCCUPATION: Retired, was a production assistant, radio, Repair equipment [employer]  PLOF: Independent  PATIENT GOALS: [facility]   NEXT MD VISIT: unsure   OBJECTIVE:  Note: Objective measures were completed at Evaluation unless otherwise noted.  DIAGNOSTIC FINDINGS:  01/2024:  IMPRESSION: 1. L3-L4 moderate to severe spinal canal stenosis with mild bilateral neural foraminal narrowing. Effacement of the right lateral recess likely compresses the descending right L4 nerve roots. Narrowing of the left lateral recess could also affect the descending left L4 nerve roots. 2. L4-L5 moderate spinal canal stenosis with mild left neural foraminal narrowing. Narrowing of the lateral recesses at this level could affect the descending L5 nerve roots. 3. L2-L3 mild-to-moderate spinal canal stenosis. 4. No evidence of metastatic disease in the lumbar spine. 5. No acute fracture or traumatic listhesis in the thoracic or lumbar spine. 6. No significant spinal canal stenosis or neural foraminal narrowing in the thoracic spine. 7. Redemonstrated infrarenal abdominal aortic aneurysm, which measures up to 4.4 cm, similar to the 2019 CTA when accounting for differences in technique.    PATIENT SURVEYS:  FOTO 67% goal is 68%, no goal set   COGNITION: Overall cognitive status: Within functional limits for tasks assessed     SENSATION: WFL  MUSCLE LENGTH: Hamstrings:Passive to about 40 deg  Thomas test: NT but tightness present bilateral   POSTURE: rounded shoulders and forward head  PALPATION: Soreness to palpation in prone to Rt mid to lower lumbar paraspinals, stiffness throughout T-L spine   LUMBAR ROM:   AROM eval  Flexion Mid shin min pain    Extension Felt good 25% limited    Right lateral flexion 50% min pain   Left lateral flexion 50% min pain   Right rotation 25% min pain   Left rotation 25% min pain    (Blank rows = not tested)  LOWER EXTREMITY ROM:     Passive  Right eval Left eval  Hip flexion WNL WNL   Hip extension    Hip  abduction    Hip adduction    Hip internal rotation Tight  Tight   Hip external rotation Felt good Felt good   Knee flexion [facility]  WFL   Knee extension    Ankle dorsiflexion    Ankle plantarflexion    Ankle inversion    Ankle eversion     (Blank rows = not tested)  LOWER EXTREMITY MMT:    MMT Right eval Left eval  Hip flexion 5 5  Hip extension 4 4  Hip abduction 4 4  Hip adduction    Hip internal rotation    Hip external rotation    Knee flexion 5 5  Knee extension 5 5  Ankle dorsiflexion 5 5  Ankle plantarflexion    Ankle inversion    Ankle eversion     (Blank rows = not tested)  LUMBAR SPECIAL TESTS:  Straight leg raise test: Negative  FUNCTIONAL TESTS:  5 times sit to stand: 17 sec no UEs  12/29/23: 452 feet   GAIT: Distance walked: 100  Assistive device utilized: None Level of assistance: Complete Independence Comments: NA   TREATMENT DATE:  [facility] Adult PT Treatment:                                                DATE: 12/31/23 Therapeutic Exercise: Nustep L5 UE/Le x 5 min Seated lumbar flexion with ball  SKTC x 3 each  PPT 5 sec x 10 Bridge 10 x 2  LTR 10 sec x 5 each way  Hamstring stretch 90/90 , 10 sec x 5 each  DKTC  20 sec x 3   Supine Marching  Supine Clam with ab bracing  Supine Piriformis stretch 20 sec x  2 each     [facility] Adult PT Treatment:                                                DATE: 12/29/23 Therapeutic Exercise: Nustep L5 UE/Le x 5 min SKTC x 3 each  Bridge 10 x 2  LTR 10 sec x 5 each way  Hamstring stretch 90/90 , 10 sec x 5 each  DKTC  20 sec x 3   Supine Marching  Supine Piriformis stretch 20 sec x  2 each  Seated  Lumbar Flexion 10 sec x 3   Therapeutic Activity: 2 MWT 452 feet without AD           [facility] Adult PT Treatment:                                                DATE: 12/17/23 Therapeutic Exercise: Performed a few reps of HEP: LTR, SKTC, hamstring stretch and bridging Self Care: Mobility, aquatics, MRI findings from 2024, stenosis  PATIENT EDUCATION:  Education details: Pain free movements and transitions Person educated: Patient Education method: Explanation, Demonstration, Verbal cues, and Handouts Education comprehension: verbalized understanding and needs further education  HOME EXERCISE PROGRAM: Access Code: [email or web address] URL: [email or web address] Date: 12/17/2023 Prepared by: Delon Norma  Exercises - Supine Hamstring Stretch with Strap  - 1 x daily - 7 x weekly - 1 sets - 3 reps - 30 hold - Supine Single Knee to Chest Stretch  - 1 x daily - 7 x weekly - 1 sets - 3 reps - 30 hold - Supine Bridge  - 1 x daily - 7 x weekly - 2 sets - 10 reps - 5 hold - Supine Lower Trunk Rotation  - 1 x daily - 7 x weekly - 2 sets - 10 reps - 10 hold 12/31/23 - Supine March  - 1 x daily - 7 x weekly - 1-2 sets - 10 reps - Knee to opposite shoulder stretch  - 1 x daily - 7 x weekly - 1 sets - 3 reps - 20-30 hold  ASSESSMENT:  CLINICAL IMPRESSION: Pt reports lower level LBP on arrival today. Continued lumbar mobility, core and LE strengthening to increase tolerance to ADLs. Pt requires cues to avoid straining lumbar during transitions on mat table. Tends to choose movements which add an increased demand on core especially in supine which causes pain. He was provided eduction on movement comparisons which will cause less pain during transitions and verbalized understanding.    EVAL: Patient is a 83 y.o. male  who was seen today for physical therapy evaluation and  treatment for low back pain due to lumbar spondylosis, stenosis.   OBJECTIVE IMPAIRMENTS: decreased knowledge of condition, decreased mobility, difficulty walking, decreased ROM, decreased strength, hypomobility, increased fascial restrictions, impaired flexibility, postural dysfunction, and pain.   ACTIVITY LIMITATIONS: carrying, lifting, bending, sitting, standing, squatting, sleeping, and locomotion level  PARTICIPATION LIMITATIONS: cleaning, shopping, community activity, and yard work  PERSONAL FACTORS: Age, Time since onset of injury/illness/exacerbation, and 1 comorbidity: Rt knee pain   are also affecting patient's functional outcome.   REHAB POTENTIAL: Excellent  CLINICAL DECISION MAKING: Stable/uncomplicated  EVALUATION COMPLEXITY: Low   GOALS: Goals reviewed with patient? Yes  SHORT TERM GOALS= LONG TERM GOALS Target date: 01/28/2024    Pt will be I with HEP for trunk mobility and strength Baseline: Goal status: INITIAL  2.  Pt will be able to walk 2 miles with min increase in [facility]  Baseline:  Goal status: INITIAL  3.  Pt will be able to report improved ability to sit for meals, not limited by pain for 30 min  Baseline:  Goal status: INITIAL  4.  Pt will be able to report no pain with trunk ROM all planes  Baseline: min pain with rotation and sidebending  Goal status: INITIAL  5.  Pt will be able to lift items from the floor with good form (less than 20 lbs) and no increased pain  Baseline:  Goal status: INITIAL  PLAN:  PT FREQUENCY: 2x/week  PT DURATION: 6 weeks  PLANNED INTERVENTIONS: 97164- PT Re-evaluation, 97110-Therapeutic exercises, 97530- Therapeutic activity, 97112- Neuromuscular re-education, 97535- Self Care, [id number]- Manual therapy, [phone]- Aquatic Therapy, Patient/Family education, Balance training, Dry Needling, Spinal mobilization, Cryotherapy, and Moist heat.  PLAN FOR NEXT SESSION: check HEP, Nustep,  core routine     Harlene Persons, PTA 12/31/23 9:55 AM Phone: [phone] Fax: [phone]

## 2024-01-07 ENCOUNTER — Ambulatory Visit: Payer: Medicare HMO | Admitting: Physical Therapy

## 2024-01-07 ENCOUNTER — Encounter: Payer: Self-pay | Admitting: Physical Therapy

## 2024-01-07 DIAGNOSIS — M5459 Other low back pain: Secondary | ICD-10-CM

## 2024-01-07 DIAGNOSIS — M2560 Stiffness of unspecified joint, not elsewhere classified: Secondary | ICD-10-CM

## 2024-01-07 NOTE — Therapy (Addendum)
 OUTPATIENT PHYSICAL THERAPY THORACOLUMBAR TREATMENT   Patient Name: Kevin Pacheco. MRN: [id number] DOB:08-Feb-1941, 83 y.o., male Today's Date: 01/07/2024    PHYSICAL THERAPY DISCHARGE SUMMARY  Visits from Start of Care: 4  Current functional level related to goals / functional outcomes: See below for most recent    Remaining deficits: None limiting function.    Education / Equipment: HEP, posture, walking    Patient agrees to discharge. Patient goals were  patient requests DC as he is feeling better! . Patient is being discharged due to being pleased with the current functional level.  END OF SESSION:  PT End of Session - 01/07/24 0847     Visit Number 4    Number of Visits 12    Date for PT Re-Evaluation 01/28/24    Authorization Type [employer]    PT Start Time 0845    PT Stop Time 0925    PT Time Calculation (min) 40 min             Past Medical History:  Diagnosis Date   AAA (abdominal aortic aneurysm) (HCC) 12/15/2019   A moderate sized abdominal aortic aneurysm measuring 4.03 x 4.01 x 4.24 cm is seen in the mid aorta.   Arthritis    Bilateral carotid artery stenosis    Essential hypertension 12/30/2018   GERD (gastroesophageal reflux disease)    H/O tobacco use, presenting hazards to health 12/30/2018   > 50-60 pack year quit 2012   Headache(784.0)    History of glaucoma    Incomplete RBBB 12/29/2019   Noted on EKG   Prostate cancer (HCC)    Pure hypercholesterolemia 12/30/2018   Sickle cell trait (HCC)    Urinary incontinence    Past Surgical History:  Procedure Laterality Date   ARTHROSCOPY KNEE W/ DRILLING Left 05/01/10   BLADDER TUMOR EXCISION  2009   CARDIAC CATHETERIZATION     hx 1980's can't remember M D    CATARACT EXTRACTION, BILATERAL     COLONOSCOPY  2005, 2012   FRACTURE SURGERY Right    ankle   I & D KNEE WITH POLY EXCHANGE Left 03/14/2013   Procedure: total knee revision;  Surgeon: Dannielle Huh, MD;  Location: [facility] OR;  Service:  Orthopedics;  Laterality: Left;   JOINT REPLACEMENT Left 02/06/11   KNEE ARTHROPLASTY Right 04/18/2020   Procedure: COMPUTER ASSISTED TOTAL KNEE ARTHROPLASTY;  Surgeon: Samson Frederic, MD;  Location: [facility] ORS;  Service: Orthopedics;  Laterality: Right;   PROSTATECTOMY  2001   urethal sling  2009   Patient Active Problem List   Diagnosis Date Noted   Iliac artery aneurysm (HCC) 03/25/2022   Osteoarthritis of right knee 04/18/2020   Essential hypertension 12/30/2018   Pure hypercholesterolemia 12/30/2018   H/O tobacco use, presenting hazards to health 12/30/2018   Sickle cell trait (HCC) 12/30/2018   GERD (gastroesophageal reflux disease) 12/30/2018   Thoracic aorta atherosclerosis (HCC) 12/30/2018   Prostate cancer (HCC) 10/26/2015   Abdominal aortic aneurysm (AAA) >39 mm diameter (HCC) 11/08/1917    PCP: Lisbeth Renshaw, MD  REFERRING PROVIDER: Lisbeth Renshaw, MD  REFERRING DIAG: [phone] (ICD-10-CM) - Lumbar spondylosis  Rationale for Evaluation and Treatment: Rehabilitation  THERAPY DIAG:  Other low back pain  Joint stiffness of spine  ONSET DATE: acute on chronic  SUBJECTIVE:  SUBJECTIVE STATEMENT: The back is not hurting. I did not get my exercises in due to family being in town. My right knee hurts all the time. The back pain is better.    Pt with chronic low back pain since 1980's from a fracture.  He also fell in 2017.  He reports it is getting worse.  The pain is more severe at times and the pain is daily. The pain radiates into his Rt leg but stops at the knee.  The pain limits him from walking , sitting up straight.  He limits what he lifts at home. At times he feels RT leg is weaker but that may be because of the knee (THR 2021).  He has a [employer] and used to do pool  exercises but he has not been there in 8-9 mos.   PERTINENT HISTORY:  Chronic low back pain, Rt TKA   PAIN:  Are you having pain? Yes: NPRS scale: 0/10 Pain location: Low back Rt side  Pain description: dull Aggravating factors: walking 15 min or more, sitting too long Relieving factors: Alkaseltzer, changing positioning   PRECAUTIONS: Other: none   RED FLAGS: None   WEIGHT BEARING RESTRICTIONS: No  FALLS:  Has patient fallen in last 6 months? No  LIVING ENVIRONMENT: Lives with: lives with their spouse Lives in: House/apartment Stairs: No Has following equipment at home: Single point cane walking stick .  2 weeks ago he was using the cane   OCCUPATION: Retired, was a Production assistant, radio, Repair equipment [employer]  PLOF: Independent  PATIENT GOALS: [facility]   NEXT MD VISIT: unsure   OBJECTIVE:  Note: Objective measures were completed at Evaluation unless otherwise noted.  DIAGNOSTIC FINDINGS:  01/2024:  IMPRESSION: 1. L3-L4 moderate to severe spinal canal stenosis with mild bilateral neural foraminal narrowing. Effacement of the right lateral recess likely compresses the descending right L4 nerve roots. Narrowing of the left lateral recess could also affect the descending left L4 nerve roots. 2. L4-L5 moderate spinal canal stenosis with mild left neural foraminal narrowing. Narrowing of the lateral recesses at this level could affect the descending L5 nerve roots. 3. L2-L3 mild-to-moderate spinal canal stenosis. 4. No evidence of metastatic disease in the lumbar spine. 5. No acute fracture or traumatic listhesis in the thoracic or lumbar spine. 6. No significant spinal canal stenosis or neural foraminal narrowing in the thoracic spine. 7. Redemonstrated infrarenal abdominal aortic aneurysm, which measures up to 4.4 cm, similar to the 2019 CTA when accounting for differences in technique.    PATIENT  SURVEYS:  FOTO 67% goal is 68%, no goal set   COGNITION: Overall cognitive status: Within functional limits for tasks assessed     SENSATION: WFL  MUSCLE LENGTH: Hamstrings:Passive to about 40 deg  Thomas test: NT but tightness present bilateral   POSTURE: rounded shoulders and forward head  PALPATION: Soreness to palpation in prone to Rt mid to lower lumbar paraspinals, stiffness throughout T-L spine   LUMBAR ROM:   AROM eval 01/07/24  Flexion Mid shin min pain  Distal shins   Extension Felt good 25% limited   Limited 50% no pain, bends knees  Right lateral flexion 50% min pain  75% no pain  Left lateral flexion 50% min pain  75% no pain   Right rotation 25% min pain  75% pain  Left rotation 25% min pain  75% pain    (Blank rows =  not tested)  LOWER EXTREMITY ROM:     Passive  Right eval Left eval  Hip flexion WNL WNL   Hip extension    Hip abduction    Hip adduction    Hip internal rotation Tight  Tight   Hip external rotation Felt good Felt good   Knee flexion [facility]  WFL   Knee extension    Ankle dorsiflexion    Ankle plantarflexion    Ankle inversion    Ankle eversion     (Blank rows = not tested)  LOWER EXTREMITY MMT:    MMT Right eval Left eval  Hip flexion 5 5  Hip extension 4 4  Hip abduction 4 4  Hip adduction    Hip internal rotation    Hip external rotation    Knee flexion 5 5  Knee extension 5 5  Ankle dorsiflexion 5 5  Ankle plantarflexion    Ankle inversion    Ankle eversion     (Blank rows = not tested)  LUMBAR SPECIAL TESTS:  Straight leg raise test: Negative  FUNCTIONAL TESTS:  5 times sit to stand: 17 sec no UEs  12/29/23: 452 feet   GAIT: Distance walked: 100  Assistive device utilized: None Level of assistance: Complete Independence Comments: NA   TREATMENT DATE:  [facility] Adult PT Treatment:                                                DATE: 01/07/24 Therapeutic Exercise: Nustep L 5 UE/LE x 5 minutes  Seated lumbar  flexion with ball  SKTC x 3 each  PPT 5 sec x 10- cues required today for correct technique  LTR 10 sec x 5 each way  DKTC  20 sec x 3   Supine Marching  Supine Piriformis stretch 20 sec x  2 each Bridge 10 x 2   Therapeutic Activity: Lifting mechanics AROM  Use of wooden dowel along spine for feedback  AROM in all planes of Lumbar for goal check and functional movement     [facility] Adult PT Treatment:                                                DATE: 12/31/23 Therapeutic Exercise: Nustep L5 UE/Le x 5 min Seated lumbar flexion with ball  SKTC x 3 each  PPT 5 sec x 10 Bridge 10 x 2  LTR 10 sec x 5 each way  Hamstring stretch 90/90 , 10 sec x 5 each  DKTC  20 sec x 3   Supine Marching  Supine Clam with ab bracing  Supine Piriformis stretch 20 sec x  2 each     [facility] Adult PT Treatment:                                                DATE: 12/29/23 Therapeutic Exercise: Nustep L5 UE/Le x 5 min SKTC x 3 each  Bridge 10 x 2  LTR 10 sec x 5 each way  Hamstring stretch 90/90 , 10 sec x 5 each  DKTC  20 sec  x 3   Supine Marching  Supine Piriformis stretch 20 sec x  2 each  Seated Lumbar Flexion 10 sec x 3   Therapeutic Activity: 2 MWT 452 feet without AD           [facility] Adult PT Treatment:                                                DATE: 12/17/23 Therapeutic Exercise: Performed a few reps of HEP: LTR, SKTC, hamstring stretch and bridging Self Care: Mobility, aquatics, MRI findings from 2024, stenosis                                                                                                                        PATIENT EDUCATION:  Education details: Pain free movements and transitions Person educated: Patient Education method: Programmer, multimedia, Demonstration, Verbal cues, and Handouts Education comprehension: verbalized understanding and needs further education  HOME EXERCISE PROGRAM: Access Code: [email or web address] URL: [email or web address] Date:  12/17/2023 Prepared by: Karie Mainland  Exercises - Supine Hamstring Stretch with Strap  - 1 x daily - 7 x weekly - 1 sets - 3 reps - 30 hold - Supine Single Knee to Chest Stretch  - 1 x daily - 7 x weekly - 1 sets - 3 reps - 30 hold - Supine Bridge  - 1 x daily - 7 x weekly - 2 sets - 10 reps - 5 hold - Supine Lower Trunk Rotation  - 1 x daily - 7 x weekly - 2 sets - 10 reps - 10 hold 12/31/23 - Supine March  - 1 x daily - 7 x weekly - 1-2 sets - 10 reps - Knee to opposite shoulder stretch  - 1 x daily - 7 x weekly - 1 sets - 3 reps - 20-30 hold  ASSESSMENT:  CLINICAL IMPRESSION: Pt reports no back pain on arrival. Continued lumbar mobility, core and LE strengthening to increase tolerance to ADLs. Time spent with lifting activity to promote safe spine lifting. Pt required mod verbal and tactile cues for correct execution. Will continue to reinforce in future sessions. He demonstrates progress toward LTGs. Overall, he does endorse improvement in pain since beginning PT/HEP. Pt is demonstrating improved transitions in clinic with cues to decrease lumbar strain, I.e. supine to sit.    EVAL: Patient is a 83 y.o. male  who was seen today for physical therapy evaluation and treatment for low back pain due to lumbar spondylosis, stenosis.   OBJECTIVE IMPAIRMENTS: decreased knowledge of condition, decreased mobility, difficulty walking, decreased ROM, decreased strength, hypomobility, increased fascial restrictions, impaired flexibility, postural dysfunction, and pain.   ACTIVITY LIMITATIONS: carrying, lifting, bending, sitting, standing, squatting, sleeping, and locomotion level  PARTICIPATION LIMITATIONS: cleaning, shopping, community activity, and yard work  PERSONAL FACTORS: Age, Time since onset of injury/illness/exacerbation, and 1 comorbidity: Rt knee pain  are also affecting patient's functional outcome.   REHAB POTENTIAL: Excellent  CLINICAL DECISION MAKING:  Stable/uncomplicated  EVALUATION COMPLEXITY: Low   GOALS: Goals reviewed with patient? Yes  SHORT TERM GOALS= LONG TERM GOALS Target date: 01/28/2024    Pt will be I with HEP for trunk mobility and strength Baseline: Goal status: INITIAL  2.  Pt will be able to walk 2 miles with min increase in back pain [facility]  Baseline:  12/26/23: has not  Goal status: ONGOING  3.  Pt will be able to report improved ability to sit for meals, not limited by pain for 30 min  Baseline:  01/07/24: 15 minutes  Goal status: ONGOING  4.  Pt will be able to report no pain with trunk ROM all planes  Baseline: min pain with rotation and sidebending  01/07/24: improved ROM, still pain with rotation Goal status: ONGOING  5.  Pt will be able to lift items from the floor with good form (less than 20 lbs) and no increased pain  Baseline:  01/07/24: worked on hip hinging requiring cues  Goal status: ONGOING  PLAN:  PT FREQUENCY: 2x/week  PT DURATION: 6 weeks  PLANNED INTERVENTIONS: 97164- PT Re-evaluation, 97110-Therapeutic exercises, 97530- Therapeutic activity, [id number]- Neuromuscular re-education, 97535- Self Care, [id number]- Manual therapy, [phone]- Aquatic Therapy, Patient/Family education, Balance training, Dry Needling, Spinal mobilization, Cryotherapy, and Moist heat.  PLAN FOR NEXT SESSION: check HEP, Nustep,  core routine    Jannette Spanner, PTA 01/07/24 9:28 AM Phone: [phone] Fax: [phone]

## 2024-01-12 ENCOUNTER — Ambulatory Visit: Payer: Medicare HMO | Admitting: Physical Therapy

## 2024-01-13 NOTE — Therapy (Deleted)
 OUTPATIENT PHYSICAL THERAPY THORACOLUMBAR TREATMENT   Patient Name: Kevin Pacheco. MRN: [id number] DOB:21-Sep-1941, 83 y.o., male Today's Date: 01/13/2024  END OF SESSION:    Past Medical History:  Diagnosis Date   AAA (abdominal aortic aneurysm) (HCC) 12/15/2019   A moderate sized abdominal aortic aneurysm measuring 4.03 x 4.01 x 4.24 cm is seen in the mid aorta.   Arthritis    Bilateral carotid artery stenosis    Essential hypertension 12/30/2018   GERD (gastroesophageal reflux disease)    H/O tobacco use, presenting hazards to health 12/30/2018   > 50-60 pack year quit 2012   Headache(784.0)    History of glaucoma    Incomplete RBBB 12/29/2019   Noted on EKG   Prostate cancer (HCC)    Pure hypercholesterolemia 12/30/2018   Sickle cell trait (HCC)    Urinary incontinence    Past Surgical History:  Procedure Laterality Date   ARTHROSCOPY KNEE W/ DRILLING Left 05/01/10   BLADDER TUMOR EXCISION  2009   CARDIAC CATHETERIZATION     hx 1980's can't remember M D    CATARACT EXTRACTION, BILATERAL     COLONOSCOPY  2005, 2012   FRACTURE SURGERY Right    ankle   I & D KNEE WITH POLY EXCHANGE Left 03/14/2013   Procedure: total knee revision;  Surgeon: Dannielle Huh, MD;  Location: [facility] OR;  Service: Orthopedics;  Laterality: Left;   JOINT REPLACEMENT Left 02/06/11   KNEE ARTHROPLASTY Right 04/18/2020   Procedure: COMPUTER ASSISTED TOTAL KNEE ARTHROPLASTY;  Surgeon: Samson Frederic, MD;  Location: [facility] ORS;  Service: Orthopedics;  Laterality: Right;   PROSTATECTOMY  2001   urethal sling  2009   Patient Active Problem List   Diagnosis Date Noted   Iliac artery aneurysm (HCC) 03/25/2022   Osteoarthritis of right knee 04/18/2020   Essential hypertension 12/30/2018   Pure hypercholesterolemia 12/30/2018   H/O tobacco use, presenting hazards to health 12/30/2018   Sickle cell trait (HCC) 12/30/2018   GERD (gastroesophageal reflux disease) 12/30/2018   Thoracic aorta atherosclerosis  (HCC) 12/30/2018   Prostate cancer (HCC) 10/26/2015   Abdominal aortic aneurysm (AAA) >39 mm diameter (HCC) 11/08/1917    PCP: Lisbeth Renshaw, MD  REFERRING PROVIDER: Lisbeth Renshaw, MD  REFERRING DIAG: [phone] (ICD-10-CM) - Lumbar spondylosis  Rationale for Evaluation and Treatment: Rehabilitation  THERAPY DIAG:  No diagnosis found.  ONSET DATE: acute on chronic  SUBJECTIVE:                                                                                                                                                                                           SUBJECTIVE  STATEMENT: The back is not hurting. I did not get my exercises in due to family being in town. My right knee hurts all the time. The back pain is better.    Pt with chronic low back pain since 1980's from a fracture.  He also fell in 2017.  He reports it is getting worse.  The pain is more severe at times and the pain is daily. The pain radiates into his Rt leg but stops at the knee.  The pain limits him from walking , sitting up straight.  He limits what he lifts at home. At times he feels RT leg is weaker but that may be because of the knee (THR 2021).  He has a [employer] and used to do pool exercises but he has not been there in 8-9 mos.   PERTINENT HISTORY:  Chronic low back pain, Rt TKA   PAIN:  Are you having pain? Yes: NPRS scale: 0/10 Pain location: Low back Rt side  Pain description: dull Aggravating factors: walking 15 min or more, sitting too long Relieving factors: Alkaseltzer, changing positioning   PRECAUTIONS: Other: none   RED FLAGS: None   WEIGHT BEARING RESTRICTIONS: No  FALLS:  Has patient fallen in last 6 months? No  LIVING ENVIRONMENT: Lives with: lives with their spouse Lives in: House/apartment Stairs: No Has following equipment at home: Single point cane walking stick .  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   NEXT MD VISIT: unsure   OBJECTIVE:  Note: Objective measures were completed at Evaluation unless otherwise noted.  DIAGNOSTIC FINDINGS:  01/2024:  IMPRESSION: 1. L3-L4 moderate to severe spinal canal stenosis with mild bilateral neural foraminal narrowing. Effacement of the right lateral recess likely compresses the descending right L4 nerve roots. Narrowing of the left lateral recess could also affect the descending left L4 nerve roots. 2. L4-L5 moderate spinal canal stenosis with mild left neural foraminal narrowing. Narrowing of the lateral recesses at this level could affect the descending L5 nerve roots. 3. L2-L3 mild-to-moderate spinal canal stenosis. 4. No evidence of metastatic disease in the lumbar spine. 5. No acute fracture or traumatic listhesis in the thoracic or lumbar spine. 6. No significant spinal canal stenosis or neural foraminal narrowing in the thoracic spine. 7. Redemonstrated infrarenal abdominal aortic aneurysm, which measures up to 4.4 cm, similar to the 2019 CTA when accounting for differences in technique.    PATIENT SURVEYS:  FOTO 67% goal is 68%, no goal set   COGNITION: Overall cognitive status: Within functional limits for tasks assessed     SENSATION: WFL  MUSCLE LENGTH: Hamstrings:Passive to about 40 deg  Thomas test: NT but tightness present bilateral   POSTURE: rounded shoulders and forward head  PALPATION: Soreness to palpation in prone to Rt mid to lower lumbar paraspinals, stiffness throughout T-L spine   LUMBAR ROM:   AROM eval 01/07/24  Flexion Mid shin min pain  Distal shins   Extension Felt good 25% limited   Limited 50% no pain, bends knees  Right lateral flexion 50% min pain  75% no pain  Left lateral flexion 50% min pain  75% no pain   Right rotation 25% min pain  75% pain  Left rotation 25% min  pain  75% pain    (Blank rows =  not tested)  LOWER EXTREMITY ROM:     Passive  Right eval Left eval  Hip flexion WNL WNL   Hip extension    Hip abduction    Hip adduction    Hip internal rotation Tight  Tight   Hip external rotation Felt good Felt good   Knee flexion [facility]  WFL   Knee extension    Ankle dorsiflexion    Ankle plantarflexion    Ankle inversion    Ankle eversion     (Blank rows = not tested)  LOWER EXTREMITY MMT:    MMT Right eval Left eval  Hip flexion 5 5  Hip extension 4 4  Hip abduction 4 4  Hip adduction    Hip internal rotation    Hip external rotation    Knee flexion 5 5  Knee extension 5 5  Ankle dorsiflexion 5 5  Ankle plantarflexion    Ankle inversion    Ankle eversion     (Blank rows = not tested)  LUMBAR SPECIAL TESTS:  Straight leg raise test: Negative  FUNCTIONAL TESTS:  5 times sit to stand: 17 sec no UEs  12/29/23: 452 feet   GAIT: Distance walked: 100  Assistive device utilized: None Level of assistance: Complete [place] Comments: NA   TREATMENT DATE:   [facility] Adult PT Treatment:                                                DATE: 01/14/24 Therapeutic Exercise: *** Manual Therapy: *** Neuromuscular re-ed: *** Therapeutic Activity: *** Modalities: *** Self Care: ***  [facility] PT Treatment:                                                DATE: 01/07/24 Therapeutic Exercise: Nustep L 5 UE/LE x 5 minutes  Seated lumbar flexion with ball  SKTC x 3 each  PPT 5 sec x 10- cues required today for correct technique  LTR 10 sec x 5 each way  DKTC  20 sec x 3   Supine Marching  Supine Piriformis stretch 20 sec x  2 each Bridge 10 x 2   Therapeutic Activity: Lifting mechanics AROM  Use of wooden dowel along spine for feedback  AROM in all planes of Lumbar for goal check and functional movement     [facility] Adult PT Treatment:                                                DATE: 12/31/23 Therapeutic  Exercise: Nustep L5 UE/Le x 5 min Seated lumbar flexion with ball  SKTC x 3 each  PPT 5 sec x 10 Bridge 10 x 2  LTR 10 sec x 5 each way  Hamstring stretch 90/90 , 10 sec x 5 each  DKTC  20 sec x 3   Supine Marching  Supine Clam with ab bracing  Supine Piriformis stretch 20 sec x  2 each     [facility] Adult PT Treatment:  DATE: 12/29/23 Therapeutic Exercise: Nustep L5 UE/Le x 5 min SKTC x 3 each  Bridge 10 x 2  LTR 10 sec x 5 each way  Hamstring stretch 90/90 , 10 sec x 5 each  DKTC  20 sec x 3   Supine Marching  Supine Piriformis stretch 20 sec x  2 each  Seated Lumbar Flexion 10 sec x 3   Therapeutic Activity: 2 MWT 452 feet without AD           [facility] Adult PT Treatment:                                                DATE: 12/17/23 Therapeutic Exercise: Performed a few reps of HEP: LTR, SKTC, hamstring stretch and bridging Self Care: Mobility, aquatics, MRI findings from 2024, stenosis                                                                                                                        PATIENT EDUCATION:  Education details: Pain free movements and transitions Person educated: Patient Education method: Programmer, multimedia, Demonstration, Verbal cues, and Handouts Education comprehension: verbalized understanding and needs further education  HOME EXERCISE PROGRAM: Access Code: [email or web address] URL: [email or web address] Date: 12/17/2023 Prepared by: Karie Mainland  Exercises - Supine Hamstring Stretch with Strap  - 1 x daily - 7 x weekly - 1 sets - 3 reps - 30 hold - Supine Single Knee to Chest Stretch  - 1 x daily - 7 x weekly - 1 sets - 3 reps - 30 hold - Supine Bridge  - 1 x daily - 7 x weekly - 2 sets - 10 reps - 5 hold - Supine Lower Trunk Rotation  - 1 x daily - 7 x weekly - 2 sets - 10 reps - 10 hold 12/31/23 - Supine March  - 1 x daily - 7 x weekly - 1-2 sets - 10 reps - Knee to opposite shoulder  stretch  - 1 x daily - 7 x weekly - 1 sets - 3 reps - 20-30 hold  ASSESSMENT:  CLINICAL IMPRESSION: Pt reports no back pain on arrival. Continued lumbar mobility, core and LE strengthening to increase tolerance to ADLs. Time spent with lifting activity to promote safe spine lifting. Pt required mod verbal and tactile cues for correct execution. Will continue to reinforce in future sessions. He demonstrates progress toward LTGs. Overall, he does endorse improvement in pain since beginning PT/HEP. Pt is demonstrating improved transitions in clinic with cues to decrease lumbar strain, I.e. supine to sit.    EVAL: Patient is a 83 y.o. male  who was seen today for physical therapy evaluation and treatment for low back pain due to lumbar spondylosis, stenosis.   OBJECTIVE IMPAIRMENTS: decreased knowledge of condition, decreased mobility, difficulty walking, decreased ROM, decreased strength, hypomobility, increased fascial restrictions, impaired flexibility, postural  dysfunction, and pain.   ACTIVITY LIMITATIONS: carrying, lifting, bending, sitting, standing, squatting, sleeping, and locomotion level  PARTICIPATION LIMITATIONS: cleaning, shopping, community activity, and yard work  PERSONAL FACTORS: Age, Time since onset of injury/illness/exacerbation, and 1 comorbidity: Rt knee pain   are also affecting patient's functional outcome.   REHAB POTENTIAL: Excellent  CLINICAL DECISION MAKING: Stable/uncomplicated  EVALUATION COMPLEXITY: Low   GOALS: Goals reviewed with patient? Yes  SHORT TERM GOALS= LONG TERM GOALS Target date: 01/28/2024    Pt will be I with HEP for trunk mobility and strength Baseline: Goal status: INITIAL  2.  Pt will be able to walk 2 miles with min increase in back pain [facility]  Baseline:  12/26/23: has not  Goal status: ONGOING  3.  Pt will be able to report improved ability to sit for meals, not limited by pain for 30 min  Baseline:  01/07/24: 15 minutes   Goal status: ONGOING  4.  Pt will be able to report no pain with trunk ROM all planes  Baseline: min pain with rotation and sidebending  01/07/24: improved ROM, still pain with rotation Goal status: ONGOING  5.  Pt will be able to lift items from the floor with good form (less than 20 lbs) and no increased pain  Baseline:  01/07/24: worked on hip hinging requiring cues  Goal status: ONGOING  PLAN:  PT FREQUENCY: 2x/week  PT DURATION: 6 weeks  PLANNED INTERVENTIONS: 97164- PT Re-evaluation, 97110-Therapeutic exercises, 97530- Therapeutic activity, [id number]- Neuromuscular re-education, 97535- Self Care, [id number]- Manual therapy, [phone]- Aquatic Therapy, Patient/Family education, Balance training, Dry Needling, Spinal mobilization, Cryotherapy, and Moist heat.  PLAN FOR NEXT SESSION: check HEP, Nustep,  core routine    Jannette Spanner, PTA 01/13/24 9:17 AM Phone: [phone] Fax: [phone]

## 2024-01-14 ENCOUNTER — Ambulatory Visit: Payer: Medicare HMO | Admitting: Physical Therapy

## 2024-01-18 NOTE — Therapy (Deleted)
 OUTPATIENT PHYSICAL THERAPY THORACOLUMBAR TREATMENT   Patient Name: Kevin Pacheco. MRN: [id number] DOB:13-Mar-1941, 83 y.o., male Today's Date: 01/18/2024  END OF SESSION:    Past Medical History:  Diagnosis Date   AAA (abdominal aortic aneurysm) (HCC) 12/15/2019   A moderate sized abdominal aortic aneurysm measuring 4.03 x 4.01 x 4.24 cm is seen in the mid aorta.   Arthritis    Bilateral carotid artery stenosis    Essential hypertension 12/30/2018   GERD (gastroesophageal reflux disease)    H/O tobacco use, presenting hazards to health 12/30/2018   > 50-60 pack year quit 2012   Headache(784.0)    History of glaucoma    Incomplete RBBB 12/29/2019   Noted on EKG   Prostate cancer (HCC)    Pure hypercholesterolemia 12/30/2018   Sickle cell trait (HCC)    Urinary incontinence    Past Surgical History:  Procedure Laterality Date   ARTHROSCOPY KNEE W/ DRILLING Left 05/01/10   BLADDER TUMOR EXCISION  2009   CARDIAC CATHETERIZATION     hx 1980's can't remember M D    CATARACT EXTRACTION, BILATERAL     COLONOSCOPY  2005, 2012   FRACTURE SURGERY Right    ankle   I & D KNEE WITH POLY EXCHANGE Left 03/14/2013   Procedure: total knee revision;  Surgeon: Dannielle Huh, MD;  Location: [facility];  Service: Orthopedics;  Laterality: Left;   JOINT REPLACEMENT Left 02/06/11   KNEE ARTHROPLASTY Right 04/18/2020   Procedure: COMPUTER ASSISTED TOTAL KNEE ARTHROPLASTY;  Surgeon: Samson Frederic, MD;  Location: [facility];  Service: Orthopedics;  Laterality: Right;   PROSTATECTOMY  2001   urethal sling  2009   Patient Active Problem List   Diagnosis Date Noted   Iliac artery aneurysm (HCC) 03/25/2022   Osteoarthritis of right knee 04/18/2020   Essential hypertension 12/30/2018   Pure hypercholesterolemia 12/30/2018   H/O tobacco use, presenting hazards to health 12/30/2018   Sickle cell trait (HCC) 12/30/2018   GERD (gastroesophageal reflux disease) 12/30/2018   Thoracic aorta atherosclerosis  (HCC) 12/30/2018   Prostate cancer (HCC) 10/26/2015   Abdominal aortic aneurysm (AAA) >39 mm diameter (HCC) 11/08/1917    PCP: Lisbeth Renshaw, MD  REFERRING PROVIDER: Lisbeth Renshaw, MD  REFERRING DIAG: [phone] (ICD-10-CM) - Lumbar spondylosis  Rationale for Evaluation and Treatment: Rehabilitation  THERAPY DIAG:  No diagnosis found.  ONSET DATE: acute on chronic  SUBJECTIVE:                                                                                                                                                                                           SUBJECTIVE  STATEMENT: The back is not hurting. I did not get my exercises in due to family being in town. My right knee hurts all the time. The back pain is better.    Pt with chronic low back pain since 1980's from a fracture.  He also fell in 2017.  He reports it is getting worse.  The pain is more severe at times and the pain is daily. The pain radiates into his Rt leg but stops at the knee.  The pain limits him from walking , sitting up straight.  He limits what he lifts at home. At times he feels RT leg is weaker but that may be because of the knee (THR 2021).  He has a [employer] and used to do pool exercises but he has not been there in 8-9 mos.   PERTINENT HISTORY:  Chronic low back pain, Rt TKA   PAIN:  Are you having pain? Yes: NPRS scale: 0/10 Pain location: Low back Rt side  Pain description: dull Aggravating factors: walking 15 min or more, sitting too long Relieving factors: Alkaseltzer, changing positioning   PRECAUTIONS: Other: none   RED FLAGS: None   WEIGHT BEARING RESTRICTIONS: No  FALLS:  Has patient fallen in last 6 months? No  LIVING ENVIRONMENT: Lives with: lives with their spouse Lives in: House/apartment Stairs: No Has following equipment at home: Single point cane walking stick .  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   NEXT MD VISIT: unsure   OBJECTIVE:  Note: Objective measures were completed at Evaluation unless otherwise noted.  DIAGNOSTIC FINDINGS:  01/2024:  IMPRESSION: 1. L3-L4 moderate to severe spinal canal stenosis with mild bilateral neural foraminal narrowing. Effacement of the right lateral recess likely compresses the descending right L4 nerve roots. Narrowing of the left lateral recess could also affect the descending left L4 nerve roots. 2. L4-L5 moderate spinal canal stenosis with mild left neural foraminal narrowing. Narrowing of the lateral recesses at this level could affect the descending L5 nerve roots. 3. L2-L3 mild-to-moderate spinal canal stenosis. 4. No evidence of metastatic disease in the lumbar spine. 5. No acute fracture or traumatic listhesis in the thoracic or lumbar spine. 6. No significant spinal canal stenosis or neural foraminal narrowing in the thoracic spine. 7. Redemonstrated infrarenal abdominal aortic aneurysm, which measures up to 4.4 cm, similar to the 2019 CTA when accounting for differences in technique.    PATIENT SURVEYS:  FOTO 67% goal is 68%, no goal set   COGNITION: Overall cognitive status: Within functional limits for tasks assessed     SENSATION: WFL  MUSCLE LENGTH: Hamstrings:Passive to about 40 deg  Thomas test: NT but tightness present bilateral   POSTURE: rounded shoulders and forward head  PALPATION: Soreness to palpation in prone to Rt mid to lower lumbar paraspinals, stiffness throughout T-L spine   LUMBAR ROM:   AROM eval 01/07/24  Flexion Mid shin min pain  Distal shins   Extension Felt good 25% limited   Limited 50% no pain, bends knees  Right lateral flexion 50% min pain  75% no pain  Left lateral flexion 50% min pain  75% no pain   Right rotation 25% min pain  75% pain  Left rotation 25% min  pain  75% pain    (Blank rows =  not tested)  LOWER EXTREMITY ROM:     Passive  Right eval Left eval  Hip flexion WNL WNL   Hip extension    Hip abduction    Hip adduction    Hip internal rotation Tight  Tight   Hip external rotation Felt good Felt good   Knee flexion [facility]  WFL   Knee extension    Ankle dorsiflexion    Ankle plantarflexion    Ankle inversion    Ankle eversion     (Blank rows = not tested)  LOWER EXTREMITY MMT:    MMT Right eval Left eval  Hip flexion 5 5  Hip extension 4 4  Hip abduction 4 4  Hip adduction    Hip internal rotation    Hip external rotation    Knee flexion 5 5  Knee extension 5 5  Ankle dorsiflexion 5 5  Ankle plantarflexion    Ankle inversion    Ankle eversion     (Blank rows = not tested)  LUMBAR SPECIAL TESTS:  Straight leg raise test: Negative  FUNCTIONAL TESTS:  5 times sit to stand: 17 sec no UEs  12/29/23: 452 feet   GAIT: Distance walked: 100  Assistive device utilized: None Level of assistance: Complete [place] Comments: NA   TREATMENT DATE:   [facility] Adult PT Treatment:                                                DATE: 01/14/24 Therapeutic Exercise: *** Manual Therapy: *** Neuromuscular re-ed: *** Therapeutic Activity: *** Modalities: *** Self Care: ***  [facility] Adult PT Treatment:                                                DATE: 01/07/24 Therapeutic Exercise: Nustep L 5 UE/LE x 5 minutes  Seated lumbar flexion with ball  SKTC x 3 each  PPT 5 sec x 10- cues required today for correct technique  LTR 10 sec x 5 each way  DKTC  20 sec x 3   Supine Marching  Supine Piriformis stretch 20 sec x  2 each Bridge 10 x 2   Therapeutic Activity: Lifting mechanics AROM  Use of wooden dowel along spine for feedback  AROM in all planes of Lumbar for goal check and functional movement     [facility] Adult PT Treatment:                                                DATE: 12/31/23 Therapeutic  Exercise: Nustep L5 UE/Le x 5 min Seated lumbar flexion with ball  SKTC x 3 each  PPT 5 sec x 10 Bridge 10 x 2  LTR 10 sec x 5 each way  Hamstring stretch 90/90 , 10 sec x 5 each  DKTC  20 sec x 3   Supine Marching  Supine Clam with ab bracing  Supine Piriformis stretch 20 sec x  2 each     [facility] Adult PT Treatment:  DATE: 12/29/23 Therapeutic Exercise: Nustep L5 UE/Le x 5 min SKTC x 3 each  Bridge 10 x 2  LTR 10 sec x 5 each way  Hamstring stretch 90/90 , 10 sec x 5 each  DKTC  20 sec x 3   Supine Marching  Supine Piriformis stretch 20 sec x  2 each  Seated Lumbar Flexion 10 sec x 3   Therapeutic Activity: 2 MWT 452 feet without AD           [facility] Adult PT Treatment:                                                DATE: 12/17/23 Therapeutic Exercise: Performed a few reps of HEP: LTR, SKTC, hamstring stretch and bridging Self Care: Mobility, aquatics, MRI findings from 2024, stenosis                                                                                                                        PATIENT EDUCATION:  Education details: Pain free movements and transitions Person educated: Patient Education method: Programmer, multimedia, Demonstration, Verbal cues, and Handouts Education comprehension: verbalized understanding and needs further education  HOME EXERCISE PROGRAM: Access Code: [email or web address] URL: [email or web address] Date: 12/17/2023 Prepared by: Karie Mainland  Exercises - Supine Hamstring Stretch with Strap  - 1 x daily - 7 x weekly - 1 sets - 3 reps - 30 hold - Supine Single Knee to Chest Stretch  - 1 x daily - 7 x weekly - 1 sets - 3 reps - 30 hold - Supine Bridge  - 1 x daily - 7 x weekly - 2 sets - 10 reps - 5 hold - Supine Lower Trunk Rotation  - 1 x daily - 7 x weekly - 2 sets - 10 reps - 10 hold 12/31/23 - Supine March  - 1 x daily - 7 x weekly - 1-2 sets - 10 reps - Knee to opposite shoulder  stretch  - 1 x daily - 7 x weekly - 1 sets - 3 reps - 20-30 hold  ASSESSMENT:  CLINICAL IMPRESSION: Pt reports no back pain on arrival. Continued lumbar mobility, core and LE strengthening to increase tolerance to ADLs. Time spent with lifting activity to promote safe spine lifting. Pt required mod verbal and tactile cues for correct execution. Will continue to reinforce in future sessions. He demonstrates progress toward LTGs. Overall, he does endorse improvement in pain since beginning PT/HEP. Pt is demonstrating improved transitions in clinic with cues to decrease lumbar strain, I.e. supine to sit.    EVAL: Patient is a 83 y.o. male  who was seen today for physical therapy evaluation and treatment for low back pain due to lumbar spondylosis, stenosis.   OBJECTIVE IMPAIRMENTS: decreased knowledge of condition, decreased mobility, difficulty walking, decreased ROM, decreased strength, hypomobility, increased fascial restrictions, impaired flexibility, postural  dysfunction, and pain.   ACTIVITY LIMITATIONS: carrying, lifting, bending, sitting, standing, squatting, sleeping, and locomotion level  PARTICIPATION LIMITATIONS: cleaning, shopping, community activity, and yard work  PERSONAL FACTORS: Age, Time since onset of injury/illness/exacerbation, and 1 comorbidity: Rt knee pain   are also affecting patient's functional outcome.   REHAB POTENTIAL: Excellent  CLINICAL DECISION MAKING: Stable/uncomplicated  EVALUATION COMPLEXITY: Low   GOALS: Goals reviewed with patient? Yes  SHORT TERM GOALS= LONG TERM GOALS Target date: 01/28/2024    Pt will be I with HEP for trunk mobility and strength Baseline: Goal status: INITIAL  2.  Pt will be able to walk 2 miles with min increase in [facility]  Baseline:  12/26/23: has not  Goal status: ONGOING  3.  Pt will be able to report improved ability to sit for meals, not limited by pain for 30 min  Baseline:  01/07/24: 15 minutes   Goal status: ONGOING  4.  Pt will be able to report no pain with trunk ROM all planes  Baseline: min pain with rotation and sidebending  01/07/24: improved ROM, still pain with rotation Goal status: ONGOING  5.  Pt will be able to lift items from the floor with good form (less than 20 lbs) and no increased pain  Baseline:  01/07/24: worked on hip hinging requiring cues  Goal status: ONGOING  PLAN:  PT FREQUENCY: 2x/week  PT DURATION: 6 weeks  PLANNED INTERVENTIONS: 97164- PT Re-evaluation, 97110-Therapeutic exercises, 97530- Therapeutic activity, [id number]- Neuromuscular re-education, 97535- Self Care, 66440- Manual therapy, [phone]- Aquatic Therapy, Patient/Family education, Balance training, Dry Needling, Spinal mobilization, Cryotherapy, and Moist heat.  PLAN FOR NEXT SESSION: check HEP, Nustep,  core routine    Jannette Spanner, [place] 01/18/24 3:48 PM Phone: [phone] Fax: [phone]

## 2024-01-19 ENCOUNTER — Ambulatory Visit: Payer: Medicare HMO | Admitting: Physical Therapy

## 2024-01-21 ENCOUNTER — Ambulatory Visit: Payer: Medicare HMO | Admitting: Physical Therapy

## 2024-01-26 ENCOUNTER — Encounter: Payer: Medicare HMO | Admitting: Physical Therapy

## 2024-01-29 ENCOUNTER — Other Ambulatory Visit: Payer: Self-pay | Admitting: Family Medicine

## 2024-01-29 ENCOUNTER — Ambulatory Visit
Admission: RE | Admit: 2024-01-29 | Discharge: 2024-01-29 | Disposition: A | Discharge status: Home / Self Care | Source: Ambulatory Visit | Attending: Family Medicine | Admitting: Family Medicine

## 2024-01-29 DIAGNOSIS — J40 Bronchitis, not specified as acute or chronic: Secondary | ICD-10-CM

## 2024-02-01 ENCOUNTER — Telehealth: Payer: Self-pay | Admitting: Cardiology

## 2024-02-01 NOTE — Telephone Encounter (Signed)
 I spoke with patient.  He is feeling fine now but recently had stomach pain and possibly the flu.  Did see PCP.  Patient is calling as he is not sure when follow up AAA study is to be done.  I let patient know this was planned for September 2025.  I told him we would call him back if Dr Jacinto Halim recommended study be done sooner

## 2024-02-01 NOTE — Telephone Encounter (Signed)
 Pt called in stating he has been having a pain in his stomach and he wants to make sure his abdominal aneurysm has not gotten any larger. Please advise.

## 2024-02-02 NOTE — Telephone Encounter (Signed)
 Yes please

## 2024-04-13 ENCOUNTER — Other Ambulatory Visit: Payer: Self-pay | Admitting: Family Medicine

## 2024-04-13 DIAGNOSIS — R911 Solitary pulmonary nodule: Secondary | ICD-10-CM

## 2024-04-21 ENCOUNTER — Encounter: Payer: Self-pay | Admitting: Family Medicine

## 2024-04-27 ENCOUNTER — Ambulatory Visit
Admission: RE | Admit: 2024-04-27 | Discharge: 2024-04-27 | Disposition: A | Discharge status: Home / Self Care | Source: Ambulatory Visit | Attending: Family Medicine

## 2024-04-27 DIAGNOSIS — R911 Solitary pulmonary nodule: Secondary | ICD-10-CM

## 2024-04-27 MED ORDER — IOPAMIDOL (ISOVUE-300) INJECTION 61%
100.0000 mL | Freq: Once | INTRAVENOUS | Status: AC | PRN
  Administered 2024-04-27: 100 mL via INTRAVENOUS

## 2024-05-26 ENCOUNTER — Other Ambulatory Visit (HOSPITAL_COMMUNITY): Payer: Self-pay | Admitting: Family Medicine

## 2024-05-26 DIAGNOSIS — D491 Neoplasm of unspecified behavior of respiratory system: Secondary | ICD-10-CM

## 2024-06-08 ENCOUNTER — Ambulatory Visit (HOSPITAL_COMMUNITY)

## 2024-06-08 ENCOUNTER — Encounter (HOSPITAL_COMMUNITY): Admission: RE | Admit: 2024-06-08 | Source: Ambulatory Visit

## 2024-07-12 ENCOUNTER — Ambulatory Visit: Payer: Medicare HMO | Admitting: Cardiology

## 2024-07-13 NOTE — Progress Notes (Signed)
 " Cardiology Office Note:  .   Date:  07/14/2024  ID:  Kevin Navarrete  Jr., DOB [birth year], MRN [id number] PCP: Haze Kingfisher, MD  [facility] Providers Cardiologist:  Gordy Bergamo, MD   History of Present Illness: Kevin Kevin Goldinger  Mickey. is a 83 y.o. Afro-American male with history of hypertension, hyperlipidemia, 4.4 cm AAA, prostate cancer S/P radical prostatectomy in 2001 and presently on Lupron  injections and in remission, history of 60 pack year history of cigarette smoking, quit in 2002 and thoracic aortic atherosclerosis.  He has had a normal echocardiogram in 2017 with mild LVH and nuclear stress test revealing normal perfusion scan with LVEF calculated 44%.  He presents for annual visit. He is asymptomatic.  Cardiac Studies relevent.     PET CT Abdomen 06/14/24: No renal stone or hydronephrosis. Gallbladder present. No biliary ductal dilatation. Infrarenal aneurysm measuring 49 mm. Right common iliac aneurysm measuring 27 mm. Prostatectomy.   Abdominal Aortic Duplex 01/13/2023:   Abdominal Aorta: There is evidence of abnormal dilatation of the distal Abdominal aorta at 4.2 cm. The largest aortic diameter has increased compared to  prior exam on 01/30/22 where it measured 4.1 cm. There is evidence of abnormal dilation of the Right  Common Iliac artery at 2.9 x3.1 cm. measurement was 4.1 cm obtained on 01/30/2022. Prior right CIA measured 3.3  cm.   CT angiogram abdomen pelvis 03/10/2022: Intra-renal abdominal aortic aneurysm, greatest diameter 4.4 cm.  Right CIA aneurysm at 2.6 cm.  Left CIA ectasia.    Discussed the use of AI scribe software for clinical note transcription with the patient, who gave verbal consent to proceed.  History of Present Illness Kevin Pacheco. is an 83 year old male who presents for cardiovascular follow-up.  He has been informed of an iliac artery aneurysm and an abdominal aortic aneurysm, with the latter recently measured at 49 mm. He  takes an 81 mg aspirin daily. He experiences right knee pain, which limits his physical activity, and uses a stationary bike at home. He takes ibuprofen for pain.  Labs   Care everywhere/Faxed External Labs:  Labs 06/20/2024:  Potassium 4.2, BUN 14, creatinine 0.97, EGFR 77 mL, LFTs normal.  Hb 11.8/HCT 34.9, platelets 188.  Stable.  Lipid profile 12/14/2017: Total cholesterol 139, triglycerides 112, HDL 42, LDL 77.  ROS  Review of Systems  Cardiovascular:  Negative for chest pain, dyspnea on exertion and leg swelling.   Physical Exam:   VS:  BP 133/64 (BP Location: Left Arm, Patient Position: Sitting, Cuff Size: Normal)   Pulse (!) 51   Resp 16   Ht 5' 9 (1.753 m)   Wt 195 lb (88.5 kg)   SpO2 98%   BMI 28.80 kg/m    Wt Readings from Last 3 Encounters:  07/14/24 195 lb (88.5 kg)  11/17/23 198 lb 6.6 oz (90 kg)  11/03/23 200 lb (90.7 kg)    BP Readings from Last 3 Encounters:  07/14/24 133/64  11/17/23 (!) 118/56  11/03/23 127/74   Physical Exam Neck:     Vascular: No carotid bruit or JVD.  Cardiovascular:     Rate and Rhythm: Normal rate and regular rhythm.     Pulses: Intact distal pulses.     Heart sounds: Normal heart sounds. No murmur heard.    No gallop.  Pulmonary:     Effort: Pulmonary effort is normal.     Breath sounds: Normal breath sounds.  Abdominal:     General: Bowel  sounds are normal.     Palpations: Abdomen is soft.  Musculoskeletal:     Right lower leg: No edema.     Left lower leg: No edema.    EKG:    EKG Interpretation Date/Time:  Thursday July 14 2024 09:17:33 EDT Ventricular Rate:  51 PR Interval:  146 QRS Duration:  82 QT Interval:  460 QTC Calculation: 423 R Axis:   -7  Text Interpretation: EKG 07/14/2024: First bradycardia at rate of 51 bpm, single PAC.  Compared to 09/04/2022, ectopic atrial rhythm not present. Confirmed by Kaydi Kley, Jagadeesh ([id number]) on 07/14/2024 9:35:39 AM    ASSESSMENT AND PLAN: .      ICD-10-CM   1.  Infrarenal abdominal aortic aneurysm (AAA) without rupture (HCC)  I71.43 EKG 12-Lead    VAS US  AAA DUPLEX    2. Aneurysm of right iliac artery (HCC)  I72.3 VAS US  AAA DUPLEX    3. Essential hypertension  I10     4. Pure hypercholesterolemia  E78.00      Assessment & Plan Abdominal aortic aneurysm, stable, slightly enlarged Abdominal aortic aneurysm is slightly enlarged, measured at 49 mm compared to previous measurement of 44 mm. It remains stable and not a significant concern at this time. - Recheck abdominal aortic aneurysm in six months  Right iliac artery aneurysm, stable Right iliac artery aneurysm remains stable with no significant changes.  Prostate cancer on active treatment Prostate cancer is under good control with PSA levels down to 0.1. He is on active treatment with abiraterone and prednisone. - Continue abiraterone 1000 mg daily - Continue prednisone as prescribed - Continue Lupron  injections every three months  Primary Hypertension - BP well controlled. On Amlodipine  10 mg and Olemsartan 40 mg daily.   Gastroesophageal reflux disease (GERD) GERD symptoms include heartburn, primarily in the morning. He is currently taking omeprazole  once daily and uses Alka-Seltzer as needed. - Continue omeprazole  once daily - Use Alka-Seltzer as needed for symptom relief  Bilateral knee pain, status post bilateral knee replacements Bilateral knee pain persists, particularly in the right knee where surgery was performed. This limits his physical activity, including walking. - Encourage use of stationary bicycle for exercise to improve joint mobility and overall health  Follow up: 1 Year  Signed,  Gordy Bergamo, MD, [facility] 07/14/2024, 9:25 PM [facility] [address] [place], [place] [address] Phone: [phone]. Fax:  [phone]  "

## 2024-07-14 ENCOUNTER — Ambulatory Visit: Attending: Cardiology | Admitting: Cardiology

## 2024-07-14 ENCOUNTER — Encounter: Payer: Self-pay | Admitting: Cardiology

## 2024-07-14 VITALS — BP 133/64 | HR 51 | Resp 16 | Ht 69.0 in | Wt 195.0 lb

## 2024-07-14 DIAGNOSIS — I714 Abdominal aortic aneurysm, without rupture, unspecified: Secondary | ICD-10-CM

## 2024-07-14 DIAGNOSIS — E7800 Pure hypercholesterolemia, unspecified: Secondary | ICD-10-CM

## 2024-07-14 DIAGNOSIS — I10 Essential (primary) hypertension: Secondary | ICD-10-CM | POA: Diagnosis not present

## 2024-07-14 DIAGNOSIS — I723 Aneurysm of iliac artery: Secondary | ICD-10-CM

## 2024-07-14 DIAGNOSIS — I7143 Infrarenal abdominal aortic aneurysm, without rupture: Secondary | ICD-10-CM | POA: Diagnosis not present

## 2024-07-14 NOTE — Patient Instructions (Signed)
 Medication Instructions:  Your physician recommends that you continue on your current medications as directed. Please refer to the Current Medication list given to you today.  *If you need a refill on your cardiac medications before your next appointment, please call your pharmacy*  Lab Work: none If you have labs (blood work) drawn today and your tests are completely normal, you will receive your results only by: MyChart Message (if you have MyChart) OR A paper copy in the mail If you have any lab test that is abnormal or we need to change your treatment, we will call you to review the results.  Testing/Procedures: Your physician has requested that you have an abdominal aorta duplex in 6 months. . During this test, an ultrasound is used to evaluate the aorta. Allow 30 minutes for this exam. Do not eat after midnight the day before and avoid carbonated beverages.  Please note: We ask at that you not bring children with you during ultrasound (echo/ vascular) testing. Due to room size and safety concerns, children are not allowed in the ultrasound rooms during exams. Our front office staff cannot provide observation of children in our lobby area while testing is being conducted. An adult accompanying a patient to their appointment will only be allowed in the ultrasound room at the discretion of the ultrasound technician under special circumstances. We apologize for any inconvenience.   Follow-Up: At [facility], you and your health needs are our priority.  As part of our continuing mission to provide you with exceptional heart care, our providers are all part of one team.  This team includes your primary Cardiologist (physician) and Advanced Practice Providers or APPs (Physician Assistants and Nurse Practitioners) who all work together to provide you with the care you need, when you need it.  Your next appointment:   6 month(s)  after AAA duplex  Provider:   Gordy Bergamo, MD    We  recommend signing up for the patient portal called MyChart.  Sign up information is provided on this After Visit Summary.  MyChart is used to connect with patients for Virtual Visits (Telemedicine).  Patients are able to view lab/test results, encounter notes, upcoming appointments, etc.  Non-urgent messages can be sent to your provider as well.   To learn more about what you can do with MyChart, go to [email or web address].   Other Instructions

## 2024-08-18 ENCOUNTER — Encounter (HOSPITAL_COMMUNITY): Payer: Self-pay

## 2024-08-18 ENCOUNTER — Emergency Department (HOSPITAL_COMMUNITY)

## 2024-08-18 ENCOUNTER — Emergency Department (HOSPITAL_COMMUNITY)
Admission: EM | Admit: 2024-08-18 | Discharge: 2024-08-18 | Disposition: A | Discharge status: Home / Self Care | Attending: Emergency Medicine | Admitting: Emergency Medicine

## 2024-08-18 ENCOUNTER — Other Ambulatory Visit: Payer: Self-pay

## 2024-08-18 DIAGNOSIS — I714 Abdominal aortic aneurysm, without rupture, unspecified: Secondary | ICD-10-CM | POA: Diagnosis not present

## 2024-08-18 DIAGNOSIS — Z7982 Long term (current) use of aspirin: Secondary | ICD-10-CM | POA: Insufficient documentation

## 2024-08-18 DIAGNOSIS — Z79899 Other long term (current) drug therapy: Secondary | ICD-10-CM | POA: Diagnosis not present

## 2024-08-18 DIAGNOSIS — I10 Essential (primary) hypertension: Secondary | ICD-10-CM | POA: Diagnosis not present

## 2024-08-18 DIAGNOSIS — Z87891 Personal history of nicotine dependence: Secondary | ICD-10-CM | POA: Insufficient documentation

## 2024-08-18 DIAGNOSIS — N3001 Acute cystitis with hematuria: Secondary | ICD-10-CM | POA: Diagnosis not present

## 2024-08-18 DIAGNOSIS — R109 Unspecified abdominal pain: Secondary | ICD-10-CM | POA: Diagnosis present

## 2024-08-18 LAB — URINALYSIS, ROUTINE W REFLEX MICROSCOPIC
Bilirubin Urine: NEGATIVE
Glucose, UA: NEGATIVE mg/dL
Ketones, ur: NEGATIVE mg/dL
Nitrite: NEGATIVE
Protein, ur: NEGATIVE mg/dL
Specific Gravity, Urine: 1.01 (ref 1.005–1.030)
WBC, UA: >50 WBC/hpf (ref 0–5)
pH: 6 (ref 5.0–8.0)

## 2024-08-18 LAB — CBC WITH DIFFERENTIAL/PLATELET
Abs Immature Granulocytes: 0.03 K/uL (ref 0.00–0.07)
Basophils Absolute: 0 K/uL (ref 0.0–0.1)
Basophils Relative: 0 %
Eosinophils Absolute: 0 K/uL (ref 0.0–0.5)
Eosinophils Relative: 0 %
HCT: 36.7 % — ABNORMAL LOW (ref 39.0–52.0)
Hemoglobin: 11.8 g/dL — ABNORMAL LOW (ref 13.0–17.0)
Immature Granulocytes: 1 %
Lymphocytes Relative: 20 %
Lymphs Abs: 1.3 K/uL (ref 0.7–4.0)
MCH: 29.7 pg (ref 26.0–34.0)
MCHC: 32.2 g/dL (ref 30.0–36.0)
MCV: 92.4 fL (ref 80.0–100.0)
Monocytes Absolute: 0.4 K/uL (ref 0.1–1.0)
Monocytes Relative: 6 %
Neutro Abs: 4.7 K/uL (ref 1.7–7.7)
Neutrophils Relative %: 73 %
Platelets: 184 K/uL (ref 150–400)
RBC: 3.97 MIL/uL — ABNORMAL LOW (ref 4.22–5.81)
RDW: 13.2 % (ref 11.5–15.5)
WBC: 6.5 K/uL (ref 4.0–10.5)
nRBC: 0 % (ref 0.0–0.2)

## 2024-08-18 LAB — BASIC METABOLIC PANEL WITH GFR
Anion gap: 11 (ref 5–15)
BUN: 12 mg/dL (ref 8–23)
CO2: 21 mmol/L — ABNORMAL LOW (ref 22–32)
Calcium: 10 mg/dL (ref 8.9–10.3)
Chloride: 110 mmol/L (ref 98–111)
Creatinine, Ser: 0.91 mg/dL (ref 0.61–1.24)
GFR, Estimated: >60 mL/min (ref >60)
Glucose, Bld: 120 mg/dL — ABNORMAL HIGH (ref 70–99)
Potassium: 4 mmol/L (ref 3.5–5.1)
Sodium: 142 mmol/L (ref 135–145)

## 2024-08-18 LAB — HEPATIC FUNCTION PANEL
ALT: 13 U/L (ref 0–44)
AST: 16 U/L (ref 15–41)
Albumin: 4.2 g/dL (ref 3.5–5.0)
Alkaline Phosphatase: 86 U/L (ref 38–126)
Bilirubin, Direct: 0.3 mg/dL — ABNORMAL HIGH (ref 0.0–0.2)
Indirect Bilirubin: 0.4 mg/dL (ref 0.3–0.9)
Total Bilirubin: 0.7 mg/dL (ref 0.0–1.2)
Total Protein: 7.1 g/dL (ref 6.5–8.1)

## 2024-08-18 LAB — LIPASE, BLOOD: Lipase: 36 U/L (ref 11–51)

## 2024-08-18 MED ORDER — SULFAMETHOXAZOLE-TRIMETHOPRIM 800-160 MG PO TABS: 1.0000 | ORAL_TABLET | Freq: Two times a day (BID) | ORAL | 0 refills | Status: AC

## 2024-08-18 MED ORDER — IOHEXOL 350 MG/ML SOLN
100.0000 mL | Freq: Once | INTRAVENOUS | Status: AC | PRN
Start: 2024-08-18 — End: 2024-08-18
  Administered 2024-08-18: 100 mL via INTRAVENOUS

## 2024-08-18 MED ORDER — SULFAMETHOXAZOLE-TRIMETHOPRIM 800-160 MG PO TABS
1.0000 | ORAL_TABLET | Freq: Once | ORAL | Status: AC
  Administered 2024-08-18: 1 via ORAL
  Filled 2024-08-18: qty 1

## 2024-08-18 NOTE — Discharge Instructions (Addendum)
 Your CT scan shows that the aneurysm in your abdomen now measures 4.8 cm.  You can follow-up with your doctor for this continue with regular routine surveillance and screening for this.

## 2024-08-18 NOTE — ED Triage Notes (Signed)
 Pt ambulatory to triage with complaints of RIGHT sided flank pain. Pt states that he is incontinent of urine due to prostatectomy and states that he typically has no feeling when he is urinating. Pt currently has a tingling sensation when urinating, which is different from baseline.   Pt endorses a hx of UTI

## 2024-08-18 NOTE — ED Provider Notes (Signed)
 [place] EMERGENCY DEPARTMENT AT [facility] Provider Note   CSN: [id number] Arrival date & time: 08/18/24  1238     Patient presents with: Flank Pain   Kevin Pacheco  Mickey. is a 83 y.o. male w/ hx of prostatectomy, AAA (4.4 cm), quit smoking in 2001, HTN, presenting to the ED with complaint of dysuria and abdominal pain.  Patient reports for the past 2 days he has had issues where he feels some burning or discomfort with urination, he has chronic incontinence since his prostatectomy but has never had this sensation before.  He has also noticed about 2 days of mostly right flank pain in the upper abdomen.  It tends to wax and wane, is currently mild.  He feels like it was migrating was in the lower abdomen and is now higher up.  He denies nausea or vomiting.  He moves his bowels regularly earlier today.  He does have annual screening of his abdominal aorta.  Most recently had a PET CT scan in July 2025 noting that the infrarenal aneurysm measuring 4.9 cm.   HPI     Prior to Admission medications   Medication Sig Start Date End Date Taking? Authorizing Provider  sulfamethoxazole -trimethoprim  (BACTRIM  DS) 800-160 MG tablet Take 1 tablet by mouth 2 (two) times daily for 10 days. 08/19/24 08/29/24 Yes Yamil Dougher, Donnice PARAS, MD  abiraterone acetate (ZYTIGA) 250 MG tablet Take 1,000 mg by mouth daily. 08/04/22   [provider]  alendronate (FOSAMAX) 70 MG tablet Take 70 mg by mouth once a week. 01/17/22   [provider]  amLODipine  (NORVASC ) 10 MG tablet TAKE 1 TABLET EVERY DAY 06/22/23   Ladona Heinz, MD  aspirin EC 81 MG tablet Take 81 mg by mouth daily. Swallow whole.    [provider]  aspirin-sod bicarb-citric acid (ALKA-SELTZER) 325 MG TBEF tablet Take 650 mg by mouth every 6 (six) hours as needed (indigestion).    [provider]  atorvastatin  (LIPITOR) 20 MG tablet TAKE 1 TABLET EVERY DAY 09/02/23   Ladona Heinz, MD  leuprolide  (LUPRON ) 7.5 MG  injection Inject 7.5 mg into the muscle every 3 (three) months.    [provider]  metoprolol  tartrate (LOPRESSOR ) 50 MG tablet Take 1 tablet (50 mg total) by mouth 2 (two) times daily. 08/18/22   Ladona Heinz, MD  olmesartan  (BENICAR ) 40 MG tablet TAKE 1 TABLET EVERY DAY (DISCONTINUE AVAPRO ) 12/03/21   Ladona Heinz, MD  omeprazole  (PRILOSEC) 40 MG capsule TAKE 1 CAPSULE EVERY DAY AS NEEDED FOR ACID REFLUX (REFILLS TO DR. CHARLIE TISOVEC) 11/04/22   Ladona Heinz, MD  potassium chloride  SA (KLOR-CON  M) 20 MEQ tablet Take 20 mEq by mouth daily. 10/08/23   [provider]  predniSONE (DELTASONE) 5 MG tablet Take 5 mg by mouth daily. 08/05/22   [provider]    Allergies: Patient has no known allergies.    Review of Systems  Updated Vital Signs BP (!) 145/73   Pulse 62   Temp 98.3 F (36.8 C)   Resp 18   SpO2 96%   Physical Exam Constitutional:      General: He is not in acute distress. HENT:     Head: Normocephalic and atraumatic.  Eyes:     Conjunctiva/sclera: Conjunctivae normal.     Pupils: Pupils are equal, round, and reactive to light.  Cardiovascular:     Rate and Rhythm: Normal rate and regular rhythm.  Pulmonary:     Effort: Pulmonary effort is normal.  No respiratory distress.  Abdominal:     General: There is no distension.     Tenderness: There is no abdominal tenderness. There is no right CVA tenderness, left CVA tenderness or guarding.  Skin:    General: Skin is warm and dry.  Neurological:     General: No focal deficit present.     Mental Status: He is alert. Mental status is at baseline.  Psychiatric:        Mood and Affect: Mood normal.        Behavior: Behavior normal.     (all labs ordered are listed, but only abnormal results are displayed) Labs Reviewed  URINALYSIS, ROUTINE W REFLEX MICROSCOPIC - Abnormal; Notable for the following components:      Result Value   APPearance HAZY (*)    Hgb urine dipstick SMALL (*)     Leukocytes,Ua LARGE (*)    Bacteria, UA MANY (*)    All other components within normal limits  CBC WITH DIFFERENTIAL/PLATELET - Abnormal; Notable for the following components:   RBC 3.97 (*)    Hemoglobin 11.8 (*)    HCT 36.7 (*)    All other components within normal limits  BASIC METABOLIC PANEL WITH GFR - Abnormal; Notable for the following components:   CO2 21 (*)    Glucose, Bld 120 (*)    All other components within normal limits  HEPATIC FUNCTION PANEL - Abnormal; Notable for the following components:   Bilirubin, Direct 0.3 (*)    All other components within normal limits  URINE CULTURE  LIPASE, BLOOD    EKG: None  Radiology: CT Angio Abd/Pel W and/or Wo Contrast Result Date: 08/18/2024 EXAM: CTA ABDOMEN AND PELVIS WITHOUT AND WITH CONTRAST 08/18/2024 08:52:19 PM TECHNIQUE: CTA images of the abdomen and pelvis without and with intravenous contrast. 100 mL of iohexol  (OMNIPAQUE ) 350 MG/ML injection was administered. Three-dimensional MIP/volume rendered formations were performed. Automated exposure control, iterative reconstruction, and/or weight based adjustment of the mA/kV was utilized to reduce the radiation dose to as low as reasonably achievable. COMPARISON: CT abdomen / pelvis dated 11/03/2023. CLINICAL HISTORY: Abdominal aortic aneurysm (AAA), follow up; known AAA history with abdominal mid back pain and right upper abdominal pain. RIGHT sided flank pain. Patient states that he is incontinent of urine due to prostatectomy and states that he typically has no feeling when he is urinating. Patient currently has a tingling sensation when urinating, which is different from baseline, WBCs 6.5, GFR>60, RBCs and WBCs in urine, history of AAA, HTN, GERD, prostate cancer 2001. 100 mL Omnipaque  350. FINDINGS: VASCULATURE: AORTA: 4.8 cm infrarenal abdominal aortic aneurysm (image 100), previously 4.6 cm. No dissection. CELIAC TRUNK: No acute finding. No occlusion or significant stenosis.  SUPERIOR MESENTERIC ARTERY: No acute finding. No occlusion or significant stenosis. RENAL ARTERIES: No acute finding. No occlusion or significant stenosis. ILIAC ARTERIES: 2.6 cm right common iliac artery aneurysm, unchanged. LIVER: The liver is unremarkable. GALLBLADDER AND BILE DUCTS: Gallbladder is unremarkable. No biliary ductal dilatation. SPLEEN: The spleen is unremarkable. PANCREAS: The pancreas is unremarkable. ADRENAL GLANDS: Bilateral adrenal glands demonstrate no acute abnormality. KIDNEYS, URETERS AND BLADDER: Status post prostatectomy. No stones in the kidneys or ureters. No hydronephrosis. No perinephric or periureteral stranding. Urinary bladder is unremarkable. GI AND BOWEL: Stomach and duodenal sweep demonstrate no acute abnormality. Normal appendix (image 103). There is no bowel obstruction. No abnormal bowel wall thickening or distension. REPRODUCTIVE: Status post prostatectomy. PERITONEUM AND RETRPERITONEUM: No ascites or free air. LUNG  BASE: No acute abnormality. LYMPH NODES: No lymphadenopathy. BONES AND SOFT TISSUES: Moderate degenerative changes of the lower thoracic / upper lumbar spine. No focal osseous lesions. No acute soft tissue abnormality. IMPRESSION: 1. 4.8 cm infrarenal abdominal aortic aneurysm measuring 4.8 cm, previously 4.6 cm. Right common iliac artery aneurysm measuring 2.6 cm, unchanged. Recommend CT or MRI in 12 months and establish or continue care with vascular specialist. 2. Status post prostatectomy. No evidence of metastatic disease. 3. No acute findings. Electronically signed by: Pinkie Pebbles MD 08/18/2024 08:57 PM EDT RP Workstation: [id number]     Procedures   Medications Ordered in the ED  sulfamethoxazole -trimethoprim  (BACTRIM  DS) 800-160 MG per tablet 1 tablet (1 tablet Oral Given 08/18/24 1724)  iohexol  (OMNIPAQUE ) 350 MG/ML injection 100 mL (100 mLs Intravenous Contrast Given 08/18/24 2033)                                    Medical Decision  Making Amount and/or Complexity of Data Reviewed Labs: ordered. Radiology: ordered.  Risk Prescription drug management.   This patient presents to the ED with concern for abdominal pain, dysuria. This involves an extensive number of treatment options, and is a complaint that carries with it a high risk of complications and morbidity.  The differential diagnosis includes UTI versus other intra-abdominal infection or inflammatory process versus vascular injury versus other  Co-morbidities that complicate the patient evaluation: History of known AAA  Additional history obtained from patient's daughter  External records from outside source obtained and reviewed including most recent PET CT scan report 2025  I ordered and personally interpreted labs.  The pertinent results include: UA with large leukocytes, many bacteria, negative nitrites.  White blood cell count normal.  Mild chronic anemia unchanged.  CMP and LFTs largely unremarkable.  Urine culture will be added on  I ordered imaging studies including CT abdomen pelvis I independently visualized and interpreted imaging which showed years of measuring 4.8 cm, no emergent findings otherwise; normal visualized appendix and gallbladder I agree with the radiologist interpretation  The patient was maintained on a cardiac monitor.  I personally viewed and interpreted the cardiac monitored which showed an underlying rhythm of: Sinus bradycardia, mild  I ordered medication including Bactrim  for suspected UTI I have reviewed the patients home medicines and have made adjustments as needed  Test Considered: No indication for thoracic imaging, low suspicion for pneumonia or aortic proximal dissection  After observation for nearly 9 hours in the ED, the patient remains hemodynamically stable, mentating well, without evidence of rigors or developing sepsis.  I think he is stable for outpatient treatment.  Disposition:  After consideration of the  diagnostic results and the patient's response to treatment, I feel that the patient would benefit from outpatient follow-up.      Final diagnoses:  Acute cystitis with hematuria  Abdominal aneurysm    ED Discharge Orders          Ordered    sulfamethoxazole -trimethoprim  (BACTRIM  DS) 800-160 MG tablet  2 times daily        08/18/24 2130               Cottie Donnice PARAS, MD 08/18/24 2132

## 2024-08-21 LAB — URINE CULTURE: Culture: >=100000 — AB

## 2024-08-22 ENCOUNTER — Telehealth (HOSPITAL_BASED_OUTPATIENT_CLINIC_OR_DEPARTMENT_OTHER): Payer: Self-pay | Admitting: *Deleted

## 2024-08-22 NOTE — Progress Notes (Signed)
 ED Antimicrobial Stewardship Positive Culture Follow Up   Kevin Pacheco. is an 83 y.o. male who presented to [facility] on 08/18/2024 with a chief complaint of flank pain.   Chief Complaint  Patient presents with   Flank Pain    Recent Results (from the past 720 hours)  Urine Culture     Status: Abnormal   Collection Time: 08/18/24 11:58 PM   Specimen: Urine, Clean Catch  Result Value Ref Range Status   Specimen Description   Final    URINE, CLEAN CATCH Performed at [facility], [address] [place], [place] [address]    Special Requests   Final    NONE Performed at [facility], [address], [place], [place] [address]    Culture >=100,000 COLONIES/mL ESCHERICHIA COLI (A)  Final   Report Status 08/21/2024 FINAL  Final   Organism ID, Bacteria ESCHERICHIA COLI (A)  Final      Susceptibility   Escherichia coli - MIC*    AMPICILLIN >=32 RESISTANT Resistant     CEFAZOLIN  (URINE) Value in next row Sensitive      16 SENSITIVEThis is a modified FDA-approved test that has been validated and its performance characteristics determined by the reporting laboratory.  This laboratory is certified under the Clinical Laboratory Improvement Amendments CLIA as qualified to perform high complexity clinical laboratory testing.    CEFEPIME Value in next row Sensitive      16 SENSITIVEThis is a modified FDA-approved test that has been validated and its performance characteristics determined by the reporting laboratory.  This laboratory is certified under the Clinical Laboratory Improvement Amendments CLIA as qualified to perform high complexity clinical laboratory testing.    ERTAPENEM Value in next row Sensitive      16 SENSITIVEThis is a modified FDA-approved test that has been validated and its performance characteristics determined by the reporting laboratory.  This laboratory is certified under the Clinical Laboratory Improvement Amendments CLIA as  qualified to perform high complexity clinical laboratory testing.    CEFTRIAXONE Value in next row Sensitive      16 SENSITIVEThis is a modified FDA-approved test that has been validated and its performance characteristics determined by the reporting laboratory.  This laboratory is certified under the Clinical Laboratory Improvement Amendments CLIA as qualified to perform high complexity clinical laboratory testing.    CIPROFLOXACIN Value in next row Sensitive      16 SENSITIVEThis is a modified FDA-approved test that has been validated and its performance characteristics determined by the reporting laboratory.  This laboratory is certified under the Clinical Laboratory Improvement Amendments CLIA as qualified to perform high complexity clinical laboratory testing.    GENTAMICIN Value in next row Sensitive      16 SENSITIVEThis is a modified FDA-approved test that has been validated and its performance characteristics determined by the reporting laboratory.  This laboratory is certified under the Clinical Laboratory Improvement Amendments CLIA as qualified to perform high complexity clinical laboratory testing.    NITROFURANTOIN Value in next row Sensitive      16 SENSITIVEThis is a modified FDA-approved test that has been validated and its performance characteristics determined by the reporting laboratory.  This laboratory is certified under the Clinical Laboratory Improvement Amendments CLIA as qualified to perform high complexity clinical laboratory testing.    TRIMETH /SULFA  Value in next row Sensitive      16 SENSITIVEThis is a modified FDA-approved test that has been validated and its performance characteristics determined by the  reporting laboratory.  This laboratory is certified under the Clinical Laboratory Improvement Amendments CLIA as qualified to perform high complexity clinical laboratory testing.    AMPICILLIN/SULBACTAM Value in next row Resistant      16 SENSITIVEThis is a modified  FDA-approved test that has been validated and its performance characteristics determined by the reporting laboratory.  This laboratory is certified under the Clinical Laboratory Improvement Amendments CLIA as qualified to perform high complexity clinical laboratory testing.    PIP/TAZO Value in next row Sensitive      <=4 SENSITIVEThis is a modified FDA-approved test that has been validated and its performance characteristics determined by the reporting laboratory.  This laboratory is certified under the Clinical Laboratory Improvement Amendments CLIA as qualified to perform high complexity clinical laboratory testing.    MEROPENEM Value in next row Sensitive      <=4 SENSITIVEThis is a modified FDA-approved test that has been validated and its performance characteristics determined by the reporting laboratory.  This laboratory is certified under the Clinical Laboratory Improvement Amendments CLIA as qualified to perform high complexity clinical laboratory testing.    * >=100,000 COLONIES/mL ESCHERICHIA COLI    [x]  Treated with Bactrim  for 10 days, only needs treatment for 7 days.   ED Provider: Thom Fetters, MD   Dionicia CROME Ethanael Veith 08/22/2024, 10:06 AM Clinical Pharmacist Monday - Friday phone -  [phone] Saturday - Sunday phone - [phone]

## 2024-08-22 NOTE — Telephone Encounter (Signed)
 Post ED Visit - Positive Culture Follow-up  Culture report reviewed by antimicrobial stewardship pharmacist: [facility] [x]  Vanna Labi, Pharm.D. []  Venetia Gully, Pharm.D., BCPS AQ-ID []  Garrel Crews, Pharm.D., BCPS []  Almarie Lunger, Pharm.D., BCPS []  [place], [address].D., BCPS, AAHIVP []  Rosaline Bihari, Pharm.D., BCPS, AAHIVP []  Vernell Meier, PharmD, BCPS []  Latanya Hint, PharmD, BCPS []  Donald Medley, PharmD, BCPS []  Rocky Bold, PharmD []  Dorothyann Alert, PharmD, BCPS []  Morene Babe, PharmD  [facility] []  Rosaline Edison, PharmD []  Romona Bliss, PharmD []  Dolphus Roller, PharmD []  Veva Seip, Rph []  Vernell Daunt) Leonce, PharmD []  Eva Allis, PharmD []  Rosaline Millet, PharmD []  Iantha Batch, PharmD []  Arvin Gauss, PharmD []  Wanda Hasting, PharmD []  Ronal Rav, PharmD []  Rocky Slade, PharmD []  Bard Jeans, PharmD   Positive urine culture reviewed by Dr Randol and patient instructed to stop antibiotic on 10/2. Only need 7 days.   Treated with Bactrim , organism sensitive to the same and no further patient follow-up is required at this time.  Jama Wyman Kipper 08/22/2024, 9:30 AM

## 2024-12-15 ENCOUNTER — Other Ambulatory Visit: Payer: Self-pay | Admitting: Surgery

## 2024-12-15 DIAGNOSIS — D171 Benign lipomatous neoplasm of skin and subcutaneous tissue of trunk: Secondary | ICD-10-CM

## 2024-12-28 ENCOUNTER — Other Ambulatory Visit

## 2025-01-16 ENCOUNTER — Other Ambulatory Visit (HOSPITAL_COMMUNITY)
